# Patient Record
Sex: Female | Born: 1950 | Race: Black or African American | Hispanic: No | Smoking: Never smoker
Health system: Southern US, Community
[De-identification: ages and names within clinical notes are randomized; demographics above are authoritative.]

## PROBLEM LIST (undated history)

## (undated) DIAGNOSIS — I10 Essential (primary) hypertension: Secondary | ICD-10-CM

## (undated) DIAGNOSIS — E039 Hypothyroidism, unspecified: Secondary | ICD-10-CM

## (undated) DIAGNOSIS — Z6820 Body mass index (BMI) 20.0-20.9, adult: Secondary | ICD-10-CM

## (undated) DIAGNOSIS — M109 Gout, unspecified: Secondary | ICD-10-CM

## (undated) DIAGNOSIS — Z8639 Personal history of other endocrine, nutritional and metabolic disease: Secondary | ICD-10-CM

## (undated) DIAGNOSIS — N179 Acute kidney failure, unspecified: Secondary | ICD-10-CM

## (undated) DIAGNOSIS — E119 Type 2 diabetes mellitus without complications: Secondary | ICD-10-CM

## (undated) DIAGNOSIS — H409 Unspecified glaucoma: Secondary | ICD-10-CM

## (undated) DIAGNOSIS — I341 Nonrheumatic mitral (valve) prolapse: Secondary | ICD-10-CM

## (undated) DIAGNOSIS — N189 Chronic kidney disease, unspecified: Secondary | ICD-10-CM

## (undated) DIAGNOSIS — Z992 Dependence on renal dialysis: Secondary | ICD-10-CM

## (undated) DIAGNOSIS — E785 Hyperlipidemia, unspecified: Secondary | ICD-10-CM

## (undated) HISTORY — PX: TUBAL LIGATION: SHX77

## (undated) HISTORY — DX: Body mass index (BMI) 20.0-20.9, adult: Z68.20

## (undated) HISTORY — DX: Gout, unspecified: M10.9

## (undated) HISTORY — DX: Hyperlipidemia, unspecified: E78.5

## (undated) HISTORY — DX: Dependence on renal dialysis: N17.9

## (undated) HISTORY — DX: Nonrheumatic mitral (valve) prolapse: I34.1

## (undated) HISTORY — PX: TONSILLECTOMY: SUR1361

## (undated) HISTORY — DX: Unspecified glaucoma: H40.9

## (undated) HISTORY — DX: Dependence on renal dialysis: Z99.2

---

## 1983-04-02 HISTORY — PX: THYROIDECTOMY, PARTIAL: SHX18

## 1996-05-12 ENCOUNTER — Encounter: Payer: Self-pay | Admitting: Cardiovascular Disease

## 2000-04-18 ENCOUNTER — Other Ambulatory Visit: Admission: RE | Admit: 2000-04-18 | Discharge: 2000-04-18 | Payer: Self-pay | Admitting: Obstetrics and Gynecology

## 2002-02-05 ENCOUNTER — Other Ambulatory Visit: Admission: RE | Admit: 2002-02-05 | Discharge: 2002-02-05 | Payer: Self-pay | Admitting: Obstetrics and Gynecology

## 2004-10-24 ENCOUNTER — Ambulatory Visit (HOSPITAL_COMMUNITY): Admission: RE | Admit: 2004-10-24 | Discharge: 2004-10-24 | Payer: Self-pay | Admitting: Family Medicine

## 2004-11-22 ENCOUNTER — Ambulatory Visit: Payer: Self-pay | Admitting: Cardiology

## 2004-12-10 ENCOUNTER — Ambulatory Visit: Payer: Self-pay

## 2005-05-21 ENCOUNTER — Emergency Department (HOSPITAL_COMMUNITY): Admission: EM | Admit: 2005-05-21 | Discharge: 2005-05-21 | Payer: Self-pay | Admitting: Emergency Medicine

## 2005-07-29 ENCOUNTER — Emergency Department (HOSPITAL_COMMUNITY): Admission: EM | Admit: 2005-07-29 | Discharge: 2005-07-29 | Payer: Self-pay | Admitting: Emergency Medicine

## 2006-01-02 ENCOUNTER — Encounter: Admission: RE | Admit: 2006-01-02 | Discharge: 2006-01-02 | Payer: Self-pay | Admitting: Family Medicine

## 2006-03-07 ENCOUNTER — Ambulatory Visit (HOSPITAL_COMMUNITY): Admission: RE | Admit: 2006-03-07 | Discharge: 2006-03-07 | Payer: Self-pay | Admitting: Family Medicine

## 2009-08-12 ENCOUNTER — Emergency Department (HOSPITAL_COMMUNITY)
Admission: EM | Admit: 2009-08-12 | Discharge: 2009-08-12 | Payer: Self-pay | Source: Home / Self Care | Admitting: Emergency Medicine

## 2009-08-29 ENCOUNTER — Encounter (INDEPENDENT_AMBULATORY_CARE_PROVIDER_SITE_OTHER): Payer: Self-pay | Admitting: *Deleted

## 2009-09-21 ENCOUNTER — Encounter (INDEPENDENT_AMBULATORY_CARE_PROVIDER_SITE_OTHER): Payer: Self-pay | Admitting: *Deleted

## 2009-09-25 ENCOUNTER — Ambulatory Visit: Payer: Self-pay | Admitting: Gastroenterology

## 2009-10-09 ENCOUNTER — Ambulatory Visit: Payer: Self-pay | Admitting: Gastroenterology

## 2010-01-22 ENCOUNTER — Ambulatory Visit (HOSPITAL_COMMUNITY)
Admission: RE | Admit: 2010-01-22 | Discharge: 2010-01-22 | Payer: Self-pay | Source: Home / Self Care | Admitting: Nephrology

## 2010-04-01 HISTORY — PX: KIDNEY TRANSPLANT: SHX239

## 2010-04-12 ENCOUNTER — Ambulatory Visit (HOSPITAL_COMMUNITY)
Admission: RE | Admit: 2010-04-12 | Discharge: 2010-04-12 | Payer: Self-pay | Source: Home / Self Care | Attending: Nephrology | Admitting: Nephrology

## 2010-05-01 NOTE — Letter (Signed)
Summary: [facility] Instructions  [facility]  [place], [place] [address]   Phone: [phone]  Fax: [phone]       Michelle Rubio    Dec 17, 1950    MRN: [id number]        Procedure Day Sudie Grumbling:  Adelfa Koh  10/09/09     Arrival Time:  9:00AM     Procedure Time:  10:00AM     Location of Procedure:                    [facility] _  [place] (4th Floor)                        [place]   Starting 5 days prior to your procedure 10/04/09 do not eat nuts, seeds, popcorn, corn, beans, peas,  salads, or any raw vegetables.  Do not take any fiber supplements (e.g. Metamucil, Citrucel, and Benefiber).  THE DAY BEFORE YOUR PROCEDURE         DATE: 10/08/09  DAY: SUNDAY  1.  Drink clear liquids the entire day-NO SOLID FOOD  2.  Do not drink anything colored red or purple.  Avoid juices with pulp.  No orange juice.  3.  Drink at least 64 oz. (8 glasses) of fluid/clear liquids during the day to prevent dehydration and help the prep work efficiently.  CLEAR LIQUIDS INCLUDE: Water Jello Ice Popsicles Tea (sugar ok, no milk/cream) Powdered fruit flavored drinks Coffee (sugar ok, no milk/cream) Gatorade Juice: apple, white grape, white cranberry  Lemonade Clear bullion, consomm, broth Carbonated beverages (any kind) Strained chicken noodle soup Hard Candy                             4.  In the morning, mix first dose of MoviPrep solution:    Empty 1 Pouch A and 1 Pouch B into the disposable container    Add lukewarm drinking water to the top line of the container. Mix to dissolve    Refrigerate (mixed solution should be used within 24 hrs)  5.  Begin drinking the prep at 5:00 p.m. The MoviPrep container is divided by 4 marks.   Every 15 minutes drink the solution down to the next mark (approximately 8 oz) until the full liter is complete.   6.  Follow completed prep with 16 oz of clear liquid of your choice  (Nothing red or purple).  Continue to drink clear liquids until bedtime.  7.  Before going to bed, mix second dose of MoviPrep solution:    Empty 1 Pouch A and 1 Pouch B into the disposable container    Add lukewarm drinking water to the top line of the container. Mix to dissolve    Refrigerate  THE DAY OF YOUR PROCEDURE      DATE: 10/09/09  DAY: MONDAY  Beginning at 5:00AM (5 hours before procedure):         1. Every 15 minutes, drink the solution down to the next mark (approx 8 oz) until the full liter is complete.  2. Follow completed prep with 16 oz. of clear liquid of your choice.    3. You may drink clear liquids until 8:00AM (2 HOURS BEFORE PROCEDURE).   MEDICATION INSTRUCTIONS  Unless otherwise instructed, you should take regular prescription medications with a small sip of water   as early as possible the morning  of your procedure.          OTHER INSTRUCTIONS  You will need a responsible adult at least 58 years of age to accompany you and drive you home.   This person must remain in the waiting room during your procedure.  Wear loose fitting clothing that is easily removed.  Leave jewelry and other valuables at home.  However, you may wish to bring a book to read or  an iPod/MP3 player to listen to music as you wait for your procedure to start.  Remove all body piercing jewelry and leave at home.  Total time from sign-in until discharge is approximately 2-3 hours.  You should go home directly after your procedure and rest.  You can resume normal activities the  day after your procedure.  The day of your procedure you should not:   Drive   Make legal decisions   Operate machinery   Drink alcohol   Return to work  You will receive specific instructions about eating, activities and medications before you leave.    The above instructions have been reviewed and explained to me by   Emerson Monte RN  September 25, 2009 11:08 AM     I fully  understand and can verbalize these instructions _____________________________ Date _________

## 2010-05-01 NOTE — Letter (Signed)
Summary: Previsit letter  [facility]  [place], [place] [address]   Phone: [phone]  Fax: [phone]       08/29/2009 MRN: [id number]  [facility] [place], [place]  [address]  Dear Ms. Michelle Rubio,  Welcome to the Gastroenterology Division at [facility].    You are scheduled to see a nurse for your pre-procedure visit on 09-25-09 at 10:30am on the 3rd floor at [facility] [facility].  We ask that you try to arrive at our office 15 minutes prior to your appointment time to allow for check-in.  Your nurse visit will consist of discussing your medical and surgical history, your immediate family medical history, and your medications.    Please bring a complete list of all your medications or, if you prefer, bring the medication bottles and we will list them.  We will need to be aware of both prescribed and over the counter drugs.  We will need to know exact dosage information as well.  If you are on blood thinners (Coumadin, Plavix, Aggrenox, Ticlid, etc.) please call our office today/prior to your appointment, as we need to consult with your physician about holding your medication.   Please be prepared to read and sign documents such as consent forms, a financial agreement, and acknowledgement forms.  If necessary, and with your consent, a friend or relative is welcome to sit-in on the nurse visit with you.  Please bring your insurance card so that we may make a copy of it.  If your insurance requires a referral to see a specialist, please bring your referral form from your primary care physician.  No co-pay is required for this nurse visit.     If you cannot keep your appointment, please call [phone] to cancel or reschedule prior to your appointment date.  This allows [place] the opportunity to schedule an appointment for another patient in need of care.    Thank you for choosing [facility] for your medical  needs.  We appreciate the opportunity to care for you.  Please visit [place] at our website  to learn more about our practice.                     Sincerely.                                                                                                                   The Gastroenterology Division

## 2010-05-01 NOTE — Miscellaneous (Signed)
Summary: [facility] Previsit/prep  Clinical Lists Changes  Medications: Added new medication of MOVIPREP 100 GM  SOLR (PEG-KCL-NACL-NASULF-NA ASC-C) As per prep instructions. - Signed Rx of MOVIPREP 100 GM  SOLR (PEG-KCL-NACL-NASULF-NA ASC-C) As per prep instructions.;  #1 x 0;  Signed;  Entered by: Emerson Monte RN;  Authorized by: Milus Banister MD;  Method used: Electronically to [facility] [phone]*, [address], [place], [place]  [address], Ph: [phone], Fax: [phone] Observations: Added new observation of NKA: T (09/25/2009 10:26)    Prescriptions: MOVIPREP 100 GM  SOLR (PEG-KCL-NACL-NASULF-NA ASC-C) As per prep instructions.  #1 x 0   Entered by:   Emerson Monte RN   Authorized by:   Milus Banister MD   Signed by:   Emerson Monte RN on 09/25/2009   Method used:   Electronically to        [facility] [address] [phone]* (retail)       [address]       [address]       [place], [place]  [address]       Ph: [phone]       Fax: [phone]   RxID:   [id number]

## 2010-05-01 NOTE — Procedures (Signed)
Summary: Colonoscopy  Patient: Michelle Rubio Note: All result statuses are Final unless otherwise noted.  Tests: (1) Colonoscopy (COL)   COL Colonoscopy           [facility]     [place], [place]  [address]           COLONOSCOPY PROCEDURE REPORT           PATIENT:  Michelle Rubio, Michelle Rubio  MR#:  [id number]     BIRTHDATE:  1950/04/27, 59 yrs. old  GENDER:  female     ENDOSCOPIST:  Milus Banister, MD     REF. BY:  Gus Height, M.D.     PROCEDURE DATE:  10/09/2009     PROCEDURE:  Diagnostic Colonoscopy     ASA CLASS:  Class II     INDICATIONS:  Routine Risk Screening     MEDICATIONS:   Fentanyl 75 mcg IV, Versed 7 mg IV           DESCRIPTION OF PROCEDURE:   After the risks benefits and     alternatives of the procedure were thoroughly explained, informed     consent was obtained.  Digital rectal exam was performed and     revealed no rectal masses.   The LB [id number]-[id number] endoscope     was introduced through the anus and advanced to the cecum, which     was identified by both the appendix and ileocecal valve, without     limitations.  The quality of the prep was good, using MoviPrep.     The instrument was then slowly withdrawn as the colon was fully     examined.     <<PROCEDUREIMAGES>>           FINDINGS:  A normal appearing cecum, ileocecal valve, and     appendiceal orifice were identified. The ascending, hepatic     flexure, transverse, splenic flexure, descending, sigmoid colon,     and rectum appeared unremarkable (see image1, image2, and image3).     Retroflexed views in the rectum revealed no abnormalities.    The     scope was then withdrawn from the patient and the procedure     completed.           COMPLICATIONS:  None     ENDOSCOPIC IMPRESSION:     1) Normal colon     2) No polyps or cancers           RECOMMENDATIONS:     1) Continue current colorectal screening recommendations for     "routine risk" patients with a repeat  colonoscopy in 10 years.           REPEAT EXAM:  10 years           ______________________________     Milus Banister, MD           n.     eSIGNED:   Milus Banister at 10/09/2009 10:29 AM           Trudee Grip, [id number]  Note: An exclamation mark (!) indicates a result that was not dispersed into the flowsheet. Document Creation Date: 10/09/2009 10:31 AM _______________________________________________________________________  (1) Order result status: Final Collection or observation date-time: 10/09/2009 10:27 Requested date-time:  Receipt date-time:  Reported date-time:  Referring Physician:   Ordering Physician: Owens Loffler [phone]) Specimen Source:  Source: Tawanna Cooler Order Number: [id number] Lab site:  Appended Document: Colonoscopy     Colonoscopy  Procedure date:  10/09/2009  Findings:      Location:  [facility].    Procedures Next Due Date:    Colonoscopy: 09/2019

## 2010-06-19 LAB — POCT I-STAT, CHEM 8
BUN: 36 mg/dL — ABNORMAL HIGH (ref 6–23)
Calcium, Ion: 0.95 mmol/L — ABNORMAL LOW (ref 1.12–1.32)
Creatinine, Ser: 6 mg/dL — ABNORMAL HIGH (ref 0.4–1.2)
Glucose, Bld: 89 mg/dL (ref 70–99)
Hemoglobin: 12.9 g/dL (ref 12.0–15.0)
TCO2: 26 mmol/L (ref 0–100)

## 2011-11-07 ENCOUNTER — Other Ambulatory Visit: Payer: Self-pay | Admitting: Obstetrics and Gynecology

## 2011-11-26 ENCOUNTER — Encounter (HOSPITAL_COMMUNITY): Payer: Self-pay | Admitting: Pharmacist

## 2011-12-06 ENCOUNTER — Encounter (HOSPITAL_COMMUNITY)
Admission: RE | Admit: 2011-12-06 | Discharge: 2011-12-06 | Disposition: A | Discharge status: Home / Self Care | Payer: Medicare Other | Source: Ambulatory Visit | Attending: Obstetrics and Gynecology | Admitting: Obstetrics and Gynecology

## 2011-12-06 ENCOUNTER — Encounter (HOSPITAL_COMMUNITY): Payer: Self-pay

## 2011-12-06 ENCOUNTER — Other Ambulatory Visit: Payer: Self-pay

## 2011-12-06 DIAGNOSIS — Z01812 Encounter for preprocedural laboratory examination: Secondary | ICD-10-CM | POA: Insufficient documentation

## 2011-12-06 DIAGNOSIS — Z01818 Encounter for other preprocedural examination: Secondary | ICD-10-CM | POA: Insufficient documentation

## 2011-12-06 HISTORY — DX: Chronic kidney disease, unspecified: N18.9

## 2011-12-06 HISTORY — DX: Hypothyroidism, unspecified: E03.9

## 2011-12-06 HISTORY — DX: Personal history of other endocrine, nutritional and metabolic disease: Z86.39

## 2011-12-06 HISTORY — DX: Essential (primary) hypertension: I10

## 2011-12-06 LAB — CBC
HCT: 37.8 % (ref 36.0–46.0)
Hemoglobin: 12.4 g/dL (ref 12.0–15.0)
MCHC: 32.8 g/dL (ref 30.0–36.0)
MCV: 88.5 fL (ref 78.0–100.0)
RDW: 13.6 % (ref 11.5–15.5)
WBC: 4.5 10*3/uL (ref 4.0–10.5)

## 2011-12-06 LAB — BASIC METABOLIC PANEL
BUN: 24 mg/dL — ABNORMAL HIGH (ref 6–23)
CO2: 27 mEq/L (ref 19–32)
Chloride: 103 mEq/L (ref 96–112)
Creatinine, Ser: 1.15 mg/dL — ABNORMAL HIGH (ref 0.50–1.10)
Potassium: 4.9 mEq/L (ref 3.5–5.1)

## 2011-12-06 LAB — PROTIME-INR: INR: 1.05 (ref 0.00–1.49)

## 2011-12-06 NOTE — Patient Instructions (Signed)
Your procedure is scheduled on:12/10/11  Enter through the Main Entrance at :[place] up desk phone and dial [phone] and inform us of your arrival.  Please call [phone] if you have any problems the morning of surgery.  Remember: Do not eat after midnight :  MONDAY Do not drink after:6am Tuesday- water only  Take these meds the morning of surgery with a sip of water: thyroid pill, metoprolol, Prograf, Myfortic  DO NOT wear jewelry, eye make-up, lipstick,body lotion, or dark fingernail polish. Do not shave for 48 hours prior to surgery.  If you are to be admitted after surgery, leave suitcase in car until your room has been assigned. Patients discharged on the day of surgery will not be allowed to drive home.   Remember to use your Hibiclens as instructed.

## 2011-12-06 NOTE — Pre-Procedure Instructions (Signed)
Abnormal EKG done today was shared with Dr. Dawayne Cirri, who requested old EKG for comparison, which I got from [place] shows changes per Dr. Glennon Mac and he wants pt to have cardiac clearance prior to surgery. I have notified Meghan in Dr. Loni Muse. Ross's office who will contact Dr. Harrington Challenger who is out of the office, and she will also contact patient to set up cardiac clearance.

## 2011-12-09 ENCOUNTER — Encounter: Payer: Self-pay | Admitting: *Deleted

## 2011-12-09 ENCOUNTER — Encounter: Payer: Self-pay | Admitting: Cardiology

## 2011-12-09 DIAGNOSIS — I10 Essential (primary) hypertension: Secondary | ICD-10-CM | POA: Insufficient documentation

## 2011-12-09 DIAGNOSIS — N189 Chronic kidney disease, unspecified: Secondary | ICD-10-CM | POA: Insufficient documentation

## 2011-12-09 DIAGNOSIS — Z8639 Personal history of other endocrine, nutritional and metabolic disease: Secondary | ICD-10-CM | POA: Insufficient documentation

## 2011-12-09 DIAGNOSIS — E039 Hypothyroidism, unspecified: Secondary | ICD-10-CM | POA: Insufficient documentation

## 2011-12-10 ENCOUNTER — Ambulatory Visit (INDEPENDENT_AMBULATORY_CARE_PROVIDER_SITE_OTHER): Payer: Medicare Other | Admitting: Cardiology

## 2011-12-10 ENCOUNTER — Encounter (HOSPITAL_COMMUNITY): Admission: RE | Payer: Self-pay | Source: Ambulatory Visit

## 2011-12-10 ENCOUNTER — Encounter: Payer: Self-pay | Admitting: Cardiology

## 2011-12-10 ENCOUNTER — Inpatient Hospital Stay (HOSPITAL_COMMUNITY)
Admission: RE | Admit: 2011-12-10 | Payer: Medicare Other | Source: Ambulatory Visit | Admitting: Obstetrics and Gynecology

## 2011-12-10 VITALS — BP 129/74 | HR 51 | Ht 70.0 in | Wt 148.0 lb

## 2011-12-10 DIAGNOSIS — R9431 Abnormal electrocardiogram [ECG] [EKG]: Secondary | ICD-10-CM

## 2011-12-10 DIAGNOSIS — I10 Essential (primary) hypertension: Secondary | ICD-10-CM

## 2011-12-10 DIAGNOSIS — Z01810 Encounter for preprocedural cardiovascular examination: Secondary | ICD-10-CM | POA: Insufficient documentation

## 2011-12-10 SURGERY — HYSTERECTOMY, VAGINAL
Anesthesia: Choice

## 2011-12-10 NOTE — Assessment & Plan Note (Addendum)
Plan stress echocardiogram preoperatively. If normal she may proceed with her surgery without further cardiac evaluation.

## 2011-12-10 NOTE — Progress Notes (Signed)
HPI: 61 year old female for preoperative evaluation prior to hysterectomy. Patient apparently was diagnosed with mitral valve prolapse in the past. She was scheduled for surgery and her electrocardiogram was noted to be abnormal. Cardiology is asked to evaluate. She denies dyspnea, chest pain, palpitations or syncope. No claudication.  Current Outpatient Prescriptions  Medication Sig Dispense Refill  . aspirin 81 MG tablet Take 81 mg by mouth daily.      . brimonidine (ALPHAGAN P) 0.1 % SOLN Place 1 drop into both eyes 2 (two) times daily.      Marland Kitchen levofloxacin (LEVAQUIN) 250 MG tablet Take 250 mg by mouth daily.      Marland Kitchen levothyroxine (SYNTHROID, LEVOTHROID) 112 MCG tablet Take 112 mcg by mouth daily.      . metoprolol tartrate (LOPRESSOR) 25 MG tablet Take 25 mg by mouth 2 (two) times daily.      . mycophenolate (MYFORTIC) 180 MG EC tablet Take 180 mg by mouth 2 (two) times daily.      . Omega-3 Fatty Acids (FISH OIL) 1200 MG CAPS Take 2 capsules by mouth daily.      Marland Kitchen sulfamethoxazole-trimethoprim (BACTRIM,SEPTRA) 400-80 MG per tablet Take 1 tablet by mouth every Monday, Wednesday, and Friday.      . tacrolimus (PROGRAF) 0.5 MG capsule Take 0.5 mg by mouth every morning. Takes 0.5mg  in addition to the 1mg  tablet in the morning      . tacrolimus (PROGRAF) 1 MG capsule Take 1-2 mg by mouth 2 (two) times daily. Pt takes 1 tablet (1mg ) in morning and 2 tablets (2mg ) in evening      . Travoprost, BAK Free, (TRAVATAN) 0.004 % SOLN ophthalmic solution Place 1 drop into both eyes at bedtime.        No Known Allergies  Past Medical History  Diagnosis Date  . Hypertension   . Hypothyroidism   . Chronic kidney disease     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  . H/O Graves' disease 1980s  . Mitral valve prolapse     Past Surgical History  Procedure Date  . Kidney transplant 2012  . Thyroidectomy, partial 1985  . Tubal ligation   . Tonsillectomy     History   Social History    . Marital Status: Widowed    Spouse Name: N/A    Number of Children: 2  . Years of Education: N/A   Occupational History  . Not on file.   Social History Main Topics  . Smoking status: Never Smoker   . Smokeless tobacco: Not on file  . Alcohol Use: No  . Drug Use: No  . Sexually Active:    Other Topics Concern  . Not on file   Social History Narrative  . No narrative on file    Family History  Problem Relation Age of Onset  . Coronary artery disease Mother   . Heart disease Father     CHF    ROS: fatigue but no fevers or chills, productive cough, hemoptysis, dysphasia, odynophagia, melena, hematochezia, dysuria, hematuria, rash, seizure activity, orthopnea, PND, pedal edema, claudication. Remaining systems are negative.  Physical Exam:   Blood pressure 129/74, pulse 51, height 5\' 10"  (1.778 m), weight 148 lb (67.132 kg).  General:  Well developed/well nourished in NAD Skin warm/dry Patient not depressed No peripheral clubbing Back-normal HEENT-normal/normal eyelids Neck supple/normal carotid upstroke bilaterally; no bruits; no JVD; previous thyroidectomy chest - CTA/ normal expansion CV - RRR/normal S1 and S2; no murmurs, rubs  or gallops;  PMI nondisplaced Abdomen -NT/ND, no HSM, no mass, + bowel sounds, no bruit 2+ femoral pulses, no bruits Ext-no edema, chords, 2+ DP Neuro-grossly nonfocal  ECG 12/06/2011-sinus bradycardia at a rate of 50. Nonspecific T-wave changes.

## 2011-12-10 NOTE — Assessment & Plan Note (Signed)
Blood pressure controlled. Continue present medications. 

## 2011-12-10 NOTE — Assessment & Plan Note (Signed)
Stress echocardiogram

## 2011-12-10 NOTE — Patient Instructions (Addendum)
Your physician recommends that you schedule a follow-up appointment in: AS NEEDED PENDING TEST RESULTS  Your physician has requested that you have a stress echocardiogram. For further information please visit [email or web address]. Please follow instruction sheet as given.

## 2011-12-19 ENCOUNTER — Encounter: Payer: Self-pay | Admitting: Cardiology

## 2011-12-19 ENCOUNTER — Ambulatory Visit (HOSPITAL_COMMUNITY): Payer: Medicare Other | Attending: Cardiology

## 2011-12-19 DIAGNOSIS — R9431 Abnormal electrocardiogram [ECG] [EKG]: Secondary | ICD-10-CM | POA: Insufficient documentation

## 2011-12-19 DIAGNOSIS — I10 Essential (primary) hypertension: Secondary | ICD-10-CM | POA: Insufficient documentation

## 2011-12-19 NOTE — Progress Notes (Signed)
Echocardiogram performed.  

## 2012-11-26 ENCOUNTER — Encounter (HOSPITAL_COMMUNITY): Payer: Self-pay | Admitting: Dietician

## 2012-11-26 NOTE — Progress Notes (Signed)
[facility] Diabetes Class Completion  Date:November 26, 2012  Time: [id number]  Pt attended [name]'s Diabetes Group Education Class on November 26, 2012.   Patient was educated on the following topics:   -Survival skills (signs and symptoms of hyperglycemia and hypoglycemia, treatment for hypoglycemia, ideal levels for fasting and postprandial blood sugars, goal Hgb A1c level, foot care basics)  -Recommendations for physical activity   -Carbohydrate metabolism in relation to diabetes   -Meal planning (sources of carbohydrate, carbohydrate counting, meal planning strategies, food label reading, and portion control).  Handouts provided:  -"Diabetes and You: Taking Charge of Your Health"  -"Carbohydrate Counting and Meal Planning"  -"Blood Sugar Diary"  -"Your Guide to Better Office Visits"   [name] A. Jimmye Norman, RD, LDN

## 2013-09-01 ENCOUNTER — Other Ambulatory Visit: Payer: Self-pay | Admitting: Obstetrics and Gynecology

## 2013-09-01 DIAGNOSIS — R928 Other abnormal and inconclusive findings on diagnostic imaging of breast: Secondary | ICD-10-CM

## 2013-09-13 ENCOUNTER — Ambulatory Visit
Admission: RE | Admit: 2013-09-13 | Discharge: 2013-09-13 | Disposition: A | Discharge status: Home / Self Care | Payer: Medicare Other | Source: Ambulatory Visit | Attending: Obstetrics and Gynecology | Admitting: Obstetrics and Gynecology

## 2013-09-13 DIAGNOSIS — R928 Other abnormal and inconclusive findings on diagnostic imaging of breast: Secondary | ICD-10-CM

## 2013-10-07 ENCOUNTER — Other Ambulatory Visit (INDEPENDENT_AMBULATORY_CARE_PROVIDER_SITE_OTHER): Payer: Self-pay | Admitting: Otolaryngology

## 2013-10-07 DIAGNOSIS — H918X9 Other specified hearing loss, unspecified ear: Secondary | ICD-10-CM

## 2013-10-20 ENCOUNTER — Other Ambulatory Visit: Payer: Medicare Other

## 2015-01-11 ENCOUNTER — Encounter: Payer: Self-pay | Admitting: Gastroenterology

## 2015-05-15 DIAGNOSIS — Z79899 Other long term (current) drug therapy: Secondary | ICD-10-CM | POA: Diagnosis not present

## 2015-05-15 DIAGNOSIS — Z940 Kidney transplant status: Secondary | ICD-10-CM | POA: Diagnosis not present

## 2015-05-25 DIAGNOSIS — Z6820 Body mass index (BMI) 20.0-20.9, adult: Secondary | ICD-10-CM | POA: Diagnosis not present

## 2015-05-25 DIAGNOSIS — E782 Mixed hyperlipidemia: Secondary | ICD-10-CM | POA: Diagnosis not present

## 2015-05-25 DIAGNOSIS — E039 Hypothyroidism, unspecified: Secondary | ICD-10-CM | POA: Diagnosis not present

## 2015-05-25 DIAGNOSIS — R7301 Impaired fasting glucose: Secondary | ICD-10-CM | POA: Diagnosis not present

## 2015-05-25 DIAGNOSIS — Z0000 Encounter for general adult medical examination without abnormal findings: Secondary | ICD-10-CM | POA: Diagnosis not present

## 2015-05-26 DIAGNOSIS — H401113 Primary open-angle glaucoma, right eye, severe stage: Secondary | ICD-10-CM | POA: Diagnosis not present

## 2015-05-26 DIAGNOSIS — H401122 Primary open-angle glaucoma, left eye, moderate stage: Secondary | ICD-10-CM | POA: Diagnosis not present

## 2015-06-16 DIAGNOSIS — H401113 Primary open-angle glaucoma, right eye, severe stage: Secondary | ICD-10-CM | POA: Diagnosis not present

## 2015-06-16 DIAGNOSIS — H401122 Primary open-angle glaucoma, left eye, moderate stage: Secondary | ICD-10-CM | POA: Diagnosis not present

## 2015-07-21 DIAGNOSIS — Z1389 Encounter for screening for other disorder: Secondary | ICD-10-CM | POA: Diagnosis not present

## 2015-07-21 DIAGNOSIS — E063 Autoimmune thyroiditis: Secondary | ICD-10-CM | POA: Diagnosis not present

## 2015-07-21 DIAGNOSIS — Z681 Body mass index (BMI) 19 or less, adult: Secondary | ICD-10-CM | POA: Diagnosis not present

## 2015-08-10 DIAGNOSIS — Z7982 Long term (current) use of aspirin: Secondary | ICD-10-CM | POA: Diagnosis not present

## 2015-08-10 DIAGNOSIS — Z792 Long term (current) use of antibiotics: Secondary | ICD-10-CM | POA: Diagnosis not present

## 2015-08-10 DIAGNOSIS — I10 Essential (primary) hypertension: Secondary | ICD-10-CM | POA: Diagnosis not present

## 2015-08-10 DIAGNOSIS — Z4822 Encounter for aftercare following kidney transplant: Secondary | ICD-10-CM | POA: Diagnosis not present

## 2015-08-10 DIAGNOSIS — Z940 Kidney transplant status: Secondary | ICD-10-CM | POA: Diagnosis not present

## 2015-08-10 DIAGNOSIS — D8989 Other specified disorders involving the immune mechanism, not elsewhere classified: Secondary | ICD-10-CM | POA: Diagnosis not present

## 2015-08-10 DIAGNOSIS — Z79899 Other long term (current) drug therapy: Secondary | ICD-10-CM | POA: Diagnosis not present

## 2015-08-10 DIAGNOSIS — N814 Uterovaginal prolapse, unspecified: Secondary | ICD-10-CM | POA: Diagnosis not present

## 2015-08-24 DIAGNOSIS — Z940 Kidney transplant status: Secondary | ICD-10-CM | POA: Diagnosis not present

## 2015-09-07 DIAGNOSIS — L853 Xerosis cutis: Secondary | ICD-10-CM | POA: Diagnosis not present

## 2015-09-07 DIAGNOSIS — L700 Acne vulgaris: Secondary | ICD-10-CM | POA: Diagnosis not present

## 2015-09-07 DIAGNOSIS — L814 Other melanin hyperpigmentation: Secondary | ICD-10-CM | POA: Diagnosis not present

## 2015-09-07 DIAGNOSIS — D225 Melanocytic nevi of trunk: Secondary | ICD-10-CM | POA: Diagnosis not present

## 2015-11-01 DIAGNOSIS — D899 Disorder involving the immune mechanism, unspecified: Secondary | ICD-10-CM | POA: Diagnosis not present

## 2015-11-01 DIAGNOSIS — N186 End stage renal disease: Secondary | ICD-10-CM | POA: Diagnosis not present

## 2015-11-01 DIAGNOSIS — Z7982 Long term (current) use of aspirin: Secondary | ICD-10-CM | POA: Diagnosis not present

## 2015-11-01 DIAGNOSIS — T8611 Kidney transplant rejection: Secondary | ICD-10-CM | POA: Diagnosis not present

## 2015-11-01 DIAGNOSIS — D72819 Decreased white blood cell count, unspecified: Secondary | ICD-10-CM | POA: Diagnosis not present

## 2015-11-01 DIAGNOSIS — Z4822 Encounter for aftercare following kidney transplant: Secondary | ICD-10-CM | POA: Diagnosis not present

## 2015-11-01 DIAGNOSIS — Z79899 Other long term (current) drug therapy: Secondary | ICD-10-CM | POA: Diagnosis not present

## 2015-11-01 DIAGNOSIS — I120 Hypertensive chronic kidney disease with stage 5 chronic kidney disease or end stage renal disease: Secondary | ICD-10-CM | POA: Diagnosis not present

## 2015-11-01 DIAGNOSIS — B9789 Other viral agents as the cause of diseases classified elsewhere: Secondary | ICD-10-CM | POA: Diagnosis not present

## 2015-11-01 DIAGNOSIS — X58XXXD Exposure to other specified factors, subsequent encounter: Secondary | ICD-10-CM | POA: Diagnosis not present

## 2015-11-03 DIAGNOSIS — Z1389 Encounter for screening for other disorder: Secondary | ICD-10-CM | POA: Diagnosis not present

## 2015-11-03 DIAGNOSIS — Z940 Kidney transplant status: Secondary | ICD-10-CM | POA: Diagnosis not present

## 2015-11-03 DIAGNOSIS — E063 Autoimmune thyroiditis: Secondary | ICD-10-CM | POA: Diagnosis not present

## 2015-11-03 DIAGNOSIS — D631 Anemia in chronic kidney disease: Secondary | ICD-10-CM | POA: Diagnosis not present

## 2015-11-03 DIAGNOSIS — R7309 Other abnormal glucose: Secondary | ICD-10-CM | POA: Diagnosis not present

## 2015-11-03 DIAGNOSIS — Z681 Body mass index (BMI) 19 or less, adult: Secondary | ICD-10-CM | POA: Diagnosis not present

## 2015-11-16 DIAGNOSIS — H401122 Primary open-angle glaucoma, left eye, moderate stage: Secondary | ICD-10-CM | POA: Diagnosis not present

## 2015-11-16 DIAGNOSIS — H401113 Primary open-angle glaucoma, right eye, severe stage: Secondary | ICD-10-CM | POA: Diagnosis not present

## 2016-01-25 DIAGNOSIS — E063 Autoimmune thyroiditis: Secondary | ICD-10-CM | POA: Diagnosis not present

## 2016-01-25 DIAGNOSIS — Z1389 Encounter for screening for other disorder: Secondary | ICD-10-CM | POA: Diagnosis not present

## 2016-01-25 DIAGNOSIS — Z6820 Body mass index (BMI) 20.0-20.9, adult: Secondary | ICD-10-CM | POA: Diagnosis not present

## 2016-01-25 DIAGNOSIS — M810 Age-related osteoporosis without current pathological fracture: Secondary | ICD-10-CM | POA: Diagnosis not present

## 2016-01-25 DIAGNOSIS — R5383 Other fatigue: Secondary | ICD-10-CM | POA: Diagnosis not present

## 2016-01-25 DIAGNOSIS — Z79891 Long term (current) use of opiate analgesic: Secondary | ICD-10-CM | POA: Diagnosis not present

## 2016-02-07 DIAGNOSIS — Z1231 Encounter for screening mammogram for malignant neoplasm of breast: Secondary | ICD-10-CM | POA: Diagnosis not present

## 2016-02-07 DIAGNOSIS — Z01419 Encounter for gynecological examination (general) (routine) without abnormal findings: Secondary | ICD-10-CM | POA: Diagnosis not present

## 2016-02-09 ENCOUNTER — Other Ambulatory Visit: Payer: Self-pay | Admitting: Obstetrics & Gynecology

## 2016-02-09 DIAGNOSIS — R928 Other abnormal and inconclusive findings on diagnostic imaging of breast: Secondary | ICD-10-CM

## 2016-02-16 ENCOUNTER — Other Ambulatory Visit: Payer: Self-pay

## 2016-02-16 ENCOUNTER — Ambulatory Visit
Admission: RE | Admit: 2016-02-16 | Discharge: 2016-02-16 | Disposition: A | Discharge status: Home / Self Care | Payer: PPO | Source: Ambulatory Visit | Attending: Obstetrics & Gynecology | Admitting: Obstetrics & Gynecology

## 2016-02-16 DIAGNOSIS — R928 Other abnormal and inconclusive findings on diagnostic imaging of breast: Secondary | ICD-10-CM

## 2016-03-08 DIAGNOSIS — N183 Chronic kidney disease, stage 3 (moderate): Secondary | ICD-10-CM | POA: Diagnosis not present

## 2016-03-08 DIAGNOSIS — N186 End stage renal disease: Secondary | ICD-10-CM | POA: Diagnosis not present

## 2016-03-08 DIAGNOSIS — B349 Viral infection, unspecified: Secondary | ICD-10-CM | POA: Diagnosis not present

## 2016-03-08 DIAGNOSIS — E0500 Thyrotoxicosis with diffuse goiter without thyrotoxic crisis or storm: Secondary | ICD-10-CM | POA: Diagnosis not present

## 2016-03-08 DIAGNOSIS — Z4822 Encounter for aftercare following kidney transplant: Secondary | ICD-10-CM | POA: Diagnosis not present

## 2016-03-08 DIAGNOSIS — Z7982 Long term (current) use of aspirin: Secondary | ICD-10-CM | POA: Diagnosis not present

## 2016-03-08 DIAGNOSIS — I120 Hypertensive chronic kidney disease with stage 5 chronic kidney disease or end stage renal disease: Secondary | ICD-10-CM | POA: Diagnosis not present

## 2016-03-08 DIAGNOSIS — Z79899 Other long term (current) drug therapy: Secondary | ICD-10-CM | POA: Diagnosis not present

## 2016-03-08 DIAGNOSIS — D899 Disorder involving the immune mechanism, unspecified: Secondary | ICD-10-CM | POA: Diagnosis not present

## 2016-03-15 DIAGNOSIS — N390 Urinary tract infection, site not specified: Secondary | ICD-10-CM | POA: Diagnosis not present

## 2016-04-02 DIAGNOSIS — H401113 Primary open-angle glaucoma, right eye, severe stage: Secondary | ICD-10-CM | POA: Diagnosis not present

## 2016-04-02 DIAGNOSIS — H401122 Primary open-angle glaucoma, left eye, moderate stage: Secondary | ICD-10-CM | POA: Diagnosis not present

## 2016-05-27 DIAGNOSIS — E063 Autoimmune thyroiditis: Secondary | ICD-10-CM | POA: Diagnosis not present

## 2016-05-27 DIAGNOSIS — Z1389 Encounter for screening for other disorder: Secondary | ICD-10-CM | POA: Diagnosis not present

## 2016-05-27 DIAGNOSIS — Z0000 Encounter for general adult medical examination without abnormal findings: Secondary | ICD-10-CM | POA: Diagnosis not present

## 2016-05-27 DIAGNOSIS — R7309 Other abnormal glucose: Secondary | ICD-10-CM | POA: Diagnosis not present

## 2016-05-27 DIAGNOSIS — Z6821 Body mass index (BMI) 21.0-21.9, adult: Secondary | ICD-10-CM | POA: Diagnosis not present

## 2016-06-04 DIAGNOSIS — N183 Chronic kidney disease, stage 3 (moderate): Secondary | ICD-10-CM | POA: Diagnosis not present

## 2016-07-08 DIAGNOSIS — E039 Hypothyroidism, unspecified: Secondary | ICD-10-CM | POA: Diagnosis not present

## 2016-07-19 DIAGNOSIS — N183 Chronic kidney disease, stage 3 (moderate): Secondary | ICD-10-CM | POA: Diagnosis not present

## 2016-07-19 DIAGNOSIS — I120 Hypertensive chronic kidney disease with stage 5 chronic kidney disease or end stage renal disease: Secondary | ICD-10-CM | POA: Diagnosis not present

## 2016-07-19 DIAGNOSIS — Z4822 Encounter for aftercare following kidney transplant: Secondary | ICD-10-CM | POA: Diagnosis not present

## 2016-07-19 DIAGNOSIS — Z79899 Other long term (current) drug therapy: Secondary | ICD-10-CM | POA: Diagnosis not present

## 2016-07-19 DIAGNOSIS — N186 End stage renal disease: Secondary | ICD-10-CM | POA: Diagnosis not present

## 2016-07-19 DIAGNOSIS — D899 Disorder involving the immune mechanism, unspecified: Secondary | ICD-10-CM | POA: Diagnosis not present

## 2016-08-09 DIAGNOSIS — Z7982 Long term (current) use of aspirin: Secondary | ICD-10-CM | POA: Diagnosis not present

## 2016-08-09 DIAGNOSIS — N814 Uterovaginal prolapse, unspecified: Secondary | ICD-10-CM | POA: Diagnosis not present

## 2016-08-09 DIAGNOSIS — T8611 Kidney transplant rejection: Secondary | ICD-10-CM | POA: Diagnosis not present

## 2016-08-09 DIAGNOSIS — Z4822 Encounter for aftercare following kidney transplant: Secondary | ICD-10-CM | POA: Diagnosis not present

## 2016-08-09 DIAGNOSIS — E782 Mixed hyperlipidemia: Secondary | ICD-10-CM | POA: Diagnosis not present

## 2016-08-09 DIAGNOSIS — R7989 Other specified abnormal findings of blood chemistry: Secondary | ICD-10-CM | POA: Diagnosis not present

## 2016-08-09 DIAGNOSIS — D899 Disorder involving the immune mechanism, unspecified: Secondary | ICD-10-CM | POA: Diagnosis not present

## 2016-08-09 DIAGNOSIS — R7309 Other abnormal glucose: Secondary | ICD-10-CM | POA: Diagnosis not present

## 2016-08-09 DIAGNOSIS — I10 Essential (primary) hypertension: Secondary | ICD-10-CM | POA: Diagnosis not present

## 2016-08-09 DIAGNOSIS — Z940 Kidney transplant status: Secondary | ICD-10-CM | POA: Diagnosis not present

## 2016-08-09 DIAGNOSIS — I120 Hypertensive chronic kidney disease with stage 5 chronic kidney disease or end stage renal disease: Secondary | ICD-10-CM | POA: Diagnosis not present

## 2016-08-09 DIAGNOSIS — Z79899 Other long term (current) drug therapy: Secondary | ICD-10-CM | POA: Diagnosis not present

## 2016-08-09 DIAGNOSIS — N186 End stage renal disease: Secondary | ICD-10-CM | POA: Diagnosis not present

## 2016-08-23 DIAGNOSIS — Z1389 Encounter for screening for other disorder: Secondary | ICD-10-CM | POA: Diagnosis not present

## 2016-08-23 DIAGNOSIS — E782 Mixed hyperlipidemia: Secondary | ICD-10-CM | POA: Diagnosis not present

## 2016-08-23 DIAGNOSIS — Z6820 Body mass index (BMI) 20.0-20.9, adult: Secondary | ICD-10-CM | POA: Diagnosis not present

## 2016-08-23 DIAGNOSIS — R7309 Other abnormal glucose: Secondary | ICD-10-CM | POA: Diagnosis not present

## 2016-10-10 DIAGNOSIS — J209 Acute bronchitis, unspecified: Secondary | ICD-10-CM | POA: Diagnosis not present

## 2016-10-10 DIAGNOSIS — Z6820 Body mass index (BMI) 20.0-20.9, adult: Secondary | ICD-10-CM | POA: Diagnosis not present

## 2016-10-10 DIAGNOSIS — J069 Acute upper respiratory infection, unspecified: Secondary | ICD-10-CM | POA: Diagnosis not present

## 2016-10-31 DIAGNOSIS — H401122 Primary open-angle glaucoma, left eye, moderate stage: Secondary | ICD-10-CM | POA: Diagnosis not present

## 2016-10-31 DIAGNOSIS — H401113 Primary open-angle glaucoma, right eye, severe stage: Secondary | ICD-10-CM | POA: Diagnosis not present

## 2016-11-21 DIAGNOSIS — B349 Viral infection, unspecified: Secondary | ICD-10-CM | POA: Diagnosis not present

## 2016-11-21 DIAGNOSIS — D899 Disorder involving the immune mechanism, unspecified: Secondary | ICD-10-CM | POA: Diagnosis not present

## 2016-11-21 DIAGNOSIS — I120 Hypertensive chronic kidney disease with stage 5 chronic kidney disease or end stage renal disease: Secondary | ICD-10-CM | POA: Diagnosis not present

## 2016-11-21 DIAGNOSIS — Z79899 Other long term (current) drug therapy: Secondary | ICD-10-CM | POA: Diagnosis not present

## 2016-11-21 DIAGNOSIS — T8611 Kidney transplant rejection: Secondary | ICD-10-CM | POA: Diagnosis not present

## 2016-11-21 DIAGNOSIS — Z7982 Long term (current) use of aspirin: Secondary | ICD-10-CM | POA: Diagnosis not present

## 2016-11-21 DIAGNOSIS — N183 Chronic kidney disease, stage 3 (moderate): Secondary | ICD-10-CM | POA: Diagnosis not present

## 2016-11-21 DIAGNOSIS — Z4822 Encounter for aftercare following kidney transplant: Secondary | ICD-10-CM | POA: Diagnosis not present

## 2016-11-21 DIAGNOSIS — N186 End stage renal disease: Secondary | ICD-10-CM | POA: Diagnosis not present

## 2016-12-12 DIAGNOSIS — Z940 Kidney transplant status: Secondary | ICD-10-CM | POA: Diagnosis not present

## 2016-12-12 DIAGNOSIS — Z79899 Other long term (current) drug therapy: Secondary | ICD-10-CM | POA: Diagnosis not present

## 2017-02-26 DIAGNOSIS — Z1389 Encounter for screening for other disorder: Secondary | ICD-10-CM | POA: Diagnosis not present

## 2017-02-26 DIAGNOSIS — Z6820 Body mass index (BMI) 20.0-20.9, adult: Secondary | ICD-10-CM | POA: Diagnosis not present

## 2017-02-26 DIAGNOSIS — L989 Disorder of the skin and subcutaneous tissue, unspecified: Secondary | ICD-10-CM | POA: Diagnosis not present

## 2017-04-11 DIAGNOSIS — Z792 Long term (current) use of antibiotics: Secondary | ICD-10-CM | POA: Diagnosis not present

## 2017-04-11 DIAGNOSIS — Z79899 Other long term (current) drug therapy: Secondary | ICD-10-CM | POA: Diagnosis not present

## 2017-04-11 DIAGNOSIS — Z4822 Encounter for aftercare following kidney transplant: Secondary | ICD-10-CM | POA: Diagnosis not present

## 2017-04-11 DIAGNOSIS — I10 Essential (primary) hypertension: Secondary | ICD-10-CM | POA: Diagnosis not present

## 2017-04-11 DIAGNOSIS — Z940 Kidney transplant status: Secondary | ICD-10-CM | POA: Diagnosis not present

## 2017-05-05 DIAGNOSIS — H401122 Primary open-angle glaucoma, left eye, moderate stage: Secondary | ICD-10-CM | POA: Diagnosis not present

## 2017-05-05 DIAGNOSIS — H401113 Primary open-angle glaucoma, right eye, severe stage: Secondary | ICD-10-CM | POA: Diagnosis not present

## 2017-05-28 DIAGNOSIS — N183 Chronic kidney disease, stage 3 (moderate): Secondary | ICD-10-CM | POA: Diagnosis not present

## 2017-05-28 DIAGNOSIS — E782 Mixed hyperlipidemia: Secondary | ICD-10-CM | POA: Diagnosis not present

## 2017-05-28 DIAGNOSIS — Z940 Kidney transplant status: Secondary | ICD-10-CM | POA: Diagnosis not present

## 2017-05-28 DIAGNOSIS — Z79891 Long term (current) use of opiate analgesic: Secondary | ICD-10-CM | POA: Diagnosis not present

## 2017-05-28 DIAGNOSIS — R7309 Other abnormal glucose: Secondary | ICD-10-CM | POA: Diagnosis not present

## 2017-05-28 DIAGNOSIS — M1991 Primary osteoarthritis, unspecified site: Secondary | ICD-10-CM | POA: Diagnosis not present

## 2017-05-28 DIAGNOSIS — Z6820 Body mass index (BMI) 20.0-20.9, adult: Secondary | ICD-10-CM | POA: Diagnosis not present

## 2017-05-28 DIAGNOSIS — Z0000 Encounter for general adult medical examination without abnormal findings: Secondary | ICD-10-CM | POA: Diagnosis not present

## 2017-05-28 DIAGNOSIS — Z1389 Encounter for screening for other disorder: Secondary | ICD-10-CM | POA: Diagnosis not present

## 2017-05-28 DIAGNOSIS — Z0001 Encounter for general adult medical examination with abnormal findings: Secondary | ICD-10-CM | POA: Diagnosis not present

## 2017-05-28 DIAGNOSIS — E063 Autoimmune thyroiditis: Secondary | ICD-10-CM | POA: Diagnosis not present

## 2017-06-03 ENCOUNTER — Other Ambulatory Visit: Payer: Self-pay | Admitting: Family Medicine

## 2017-06-03 DIAGNOSIS — E2839 Other primary ovarian failure: Secondary | ICD-10-CM

## 2017-06-04 DIAGNOSIS — N950 Postmenopausal bleeding: Secondary | ICD-10-CM | POA: Diagnosis not present

## 2017-06-04 DIAGNOSIS — N840 Polyp of corpus uteri: Secondary | ICD-10-CM | POA: Diagnosis not present

## 2017-06-06 ENCOUNTER — Encounter: Payer: Self-pay | Admitting: Radiology

## 2017-06-06 ENCOUNTER — Ambulatory Visit
Admission: RE | Admit: 2017-06-06 | Discharge: 2017-06-06 | Disposition: A | Discharge status: Home / Self Care | Payer: PPO | Source: Ambulatory Visit | Attending: Family Medicine | Admitting: Family Medicine

## 2017-06-06 DIAGNOSIS — E2839 Other primary ovarian failure: Secondary | ICD-10-CM

## 2017-06-06 DIAGNOSIS — Z13820 Encounter for screening for osteoporosis: Secondary | ICD-10-CM | POA: Diagnosis not present

## 2017-06-10 DIAGNOSIS — Z6821 Body mass index (BMI) 21.0-21.9, adult: Secondary | ICD-10-CM | POA: Diagnosis not present

## 2017-06-10 DIAGNOSIS — R8290 Unspecified abnormal findings in urine: Secondary | ICD-10-CM | POA: Diagnosis not present

## 2017-06-10 DIAGNOSIS — Z1211 Encounter for screening for malignant neoplasm of colon: Secondary | ICD-10-CM | POA: Diagnosis not present

## 2017-06-10 DIAGNOSIS — D649 Anemia, unspecified: Secondary | ICD-10-CM | POA: Diagnosis not present

## 2017-06-10 DIAGNOSIS — Z940 Kidney transplant status: Secondary | ICD-10-CM | POA: Diagnosis not present

## 2017-06-10 DIAGNOSIS — N183 Chronic kidney disease, stage 3 (moderate): Secondary | ICD-10-CM | POA: Diagnosis not present

## 2017-06-16 DIAGNOSIS — L821 Other seborrheic keratosis: Secondary | ICD-10-CM | POA: Diagnosis not present

## 2017-06-16 DIAGNOSIS — D225 Melanocytic nevi of trunk: Secondary | ICD-10-CM | POA: Diagnosis not present

## 2017-06-16 DIAGNOSIS — L816 Other disorders of diminished melanin formation: Secondary | ICD-10-CM | POA: Diagnosis not present

## 2017-06-24 DIAGNOSIS — Z79899 Other long term (current) drug therapy: Secondary | ICD-10-CM | POA: Diagnosis not present

## 2017-06-24 DIAGNOSIS — Z940 Kidney transplant status: Secondary | ICD-10-CM | POA: Diagnosis not present

## 2017-06-26 DIAGNOSIS — Z1231 Encounter for screening mammogram for malignant neoplasm of breast: Secondary | ICD-10-CM | POA: Diagnosis not present

## 2017-06-26 DIAGNOSIS — N814 Uterovaginal prolapse, unspecified: Secondary | ICD-10-CM | POA: Diagnosis not present

## 2017-07-24 DIAGNOSIS — N814 Uterovaginal prolapse, unspecified: Secondary | ICD-10-CM | POA: Diagnosis not present

## 2017-08-14 DIAGNOSIS — Z5181 Encounter for therapeutic drug level monitoring: Secondary | ICD-10-CM | POA: Diagnosis not present

## 2017-08-14 DIAGNOSIS — N183 Chronic kidney disease, stage 3 (moderate): Secondary | ICD-10-CM | POA: Diagnosis not present

## 2017-08-14 DIAGNOSIS — R7303 Prediabetes: Secondary | ICD-10-CM | POA: Diagnosis not present

## 2017-08-14 DIAGNOSIS — I129 Hypertensive chronic kidney disease with stage 1 through stage 4 chronic kidney disease, or unspecified chronic kidney disease: Secondary | ICD-10-CM | POA: Diagnosis not present

## 2017-08-14 DIAGNOSIS — Z792 Long term (current) use of antibiotics: Secondary | ICD-10-CM | POA: Diagnosis not present

## 2017-08-14 DIAGNOSIS — Z940 Kidney transplant status: Secondary | ICD-10-CM | POA: Diagnosis not present

## 2017-08-14 DIAGNOSIS — D8989 Other specified disorders involving the immune mechanism, not elsewhere classified: Secondary | ICD-10-CM | POA: Diagnosis not present

## 2017-08-14 DIAGNOSIS — Z79899 Other long term (current) drug therapy: Secondary | ICD-10-CM | POA: Diagnosis not present

## 2017-08-19 IMAGING — MG 2D DIGITAL DIAGNOSTIC UNILATERAL LEFT MAMMOGRAM WITH CAD AND ADJ
6 series · 6 of 18 positions shown · non-contrast
Comparison: Previous exam(s).

CLINICAL DATA: Screening recall for asymmetry seen in the left
breast on the MLO view only.

EXAM:
2D DIGITAL DIAGNOSTIC UNILATERAL LEFT MAMMOGRAM WITH CAD AND ADJUNCT
TOMO

[L ML]
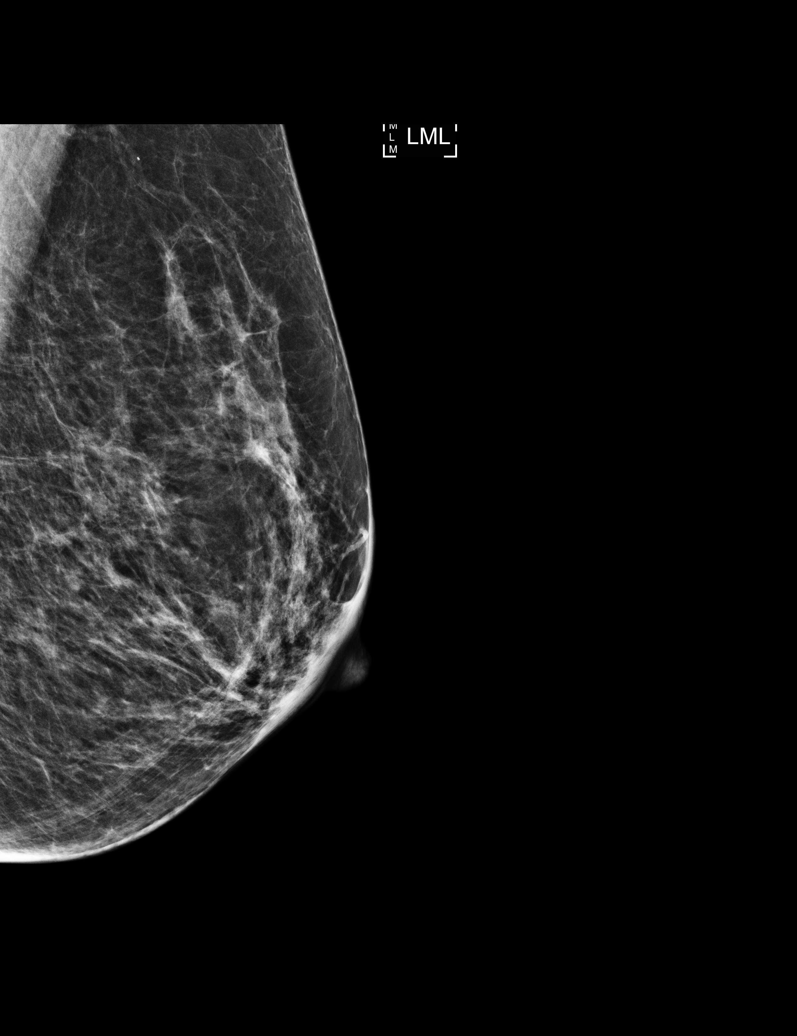

[L MLO (1 of 2)]
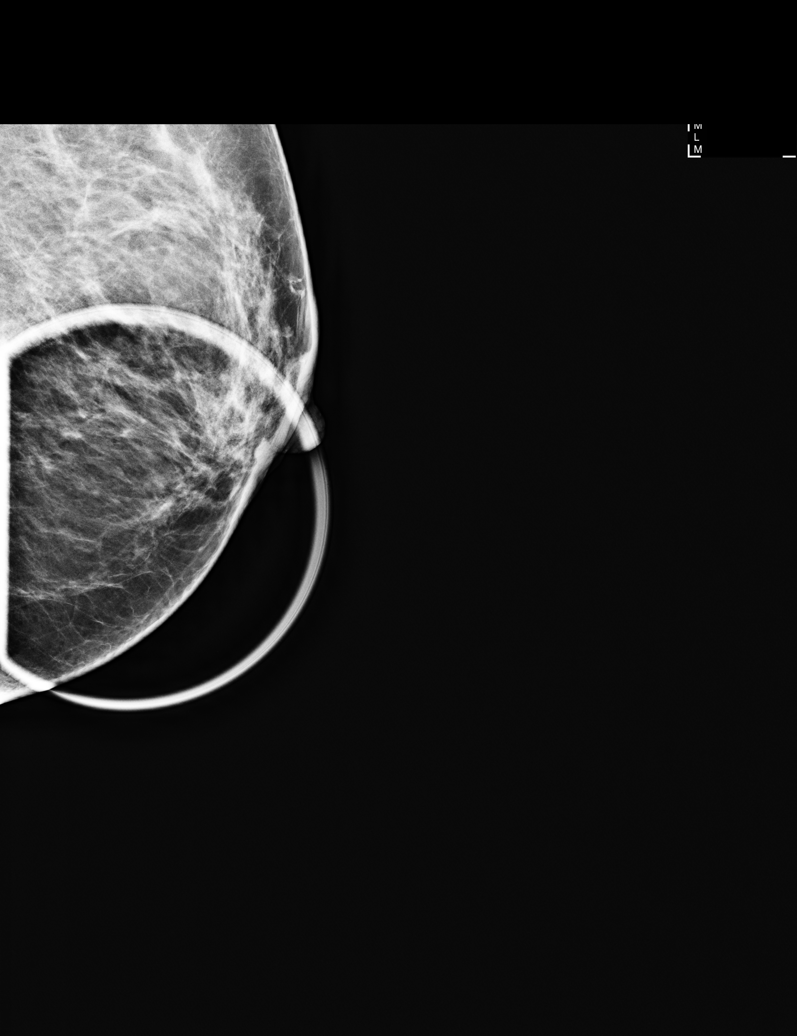

[L MLO (2 of 2)]
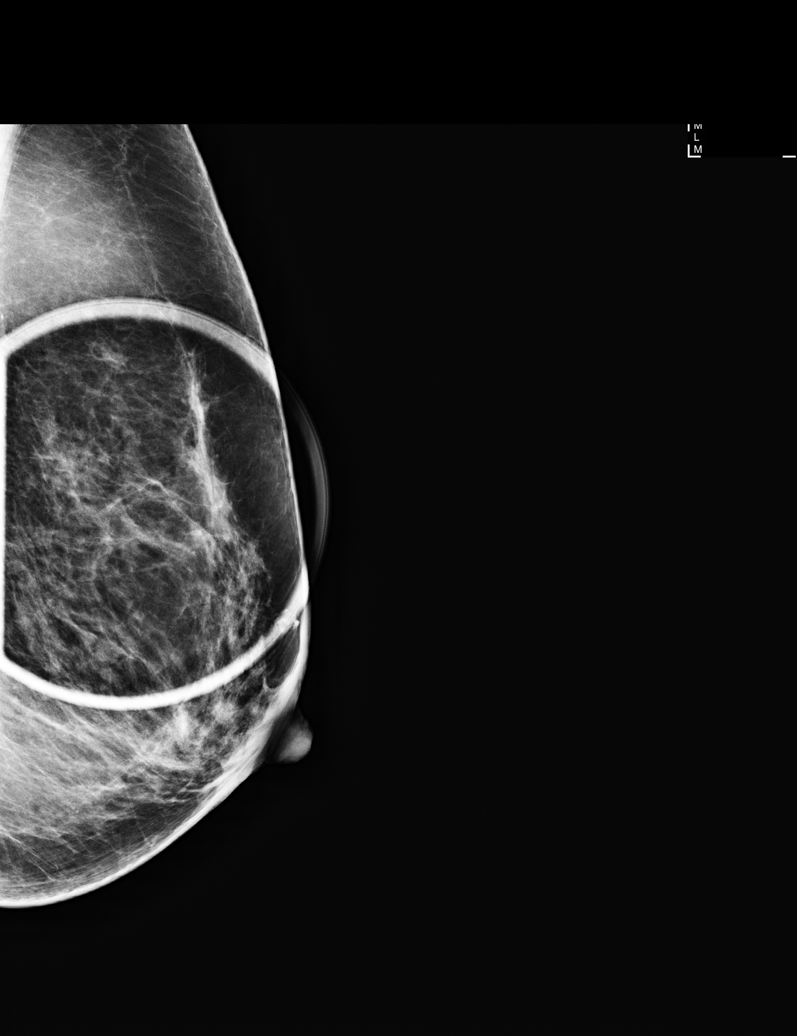

[L ML tomo · tomo slice 25/50.0]
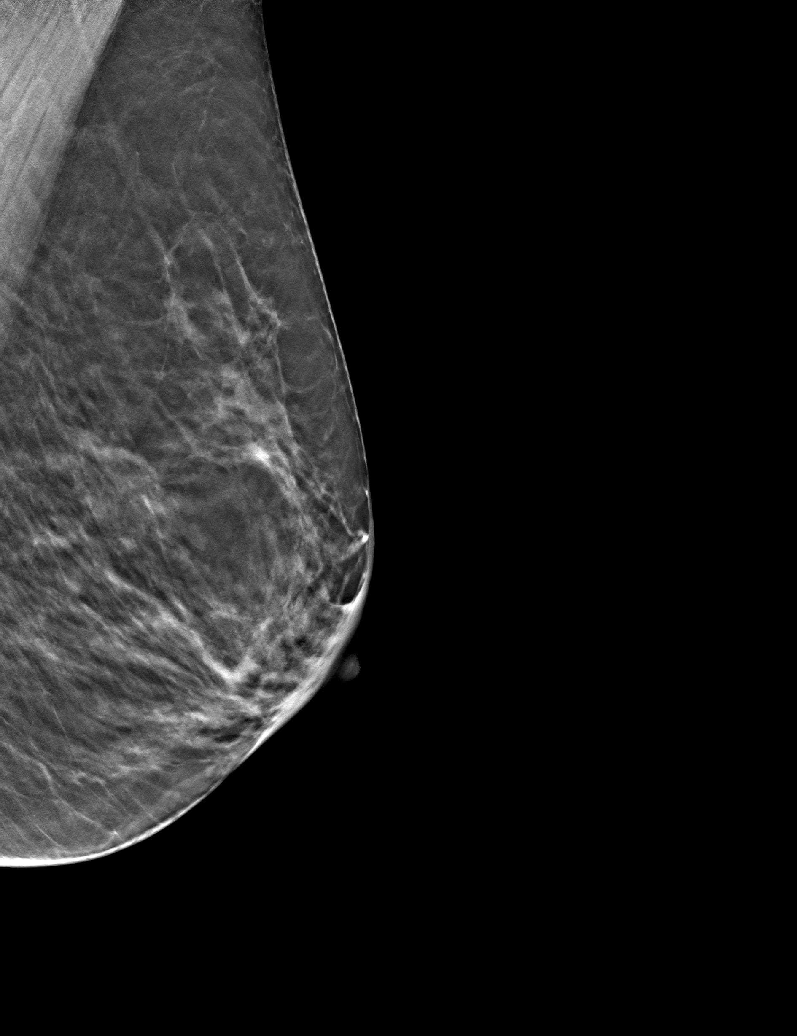

[L MLO tomo (1 of 2) · tomo slice 26/51.0]
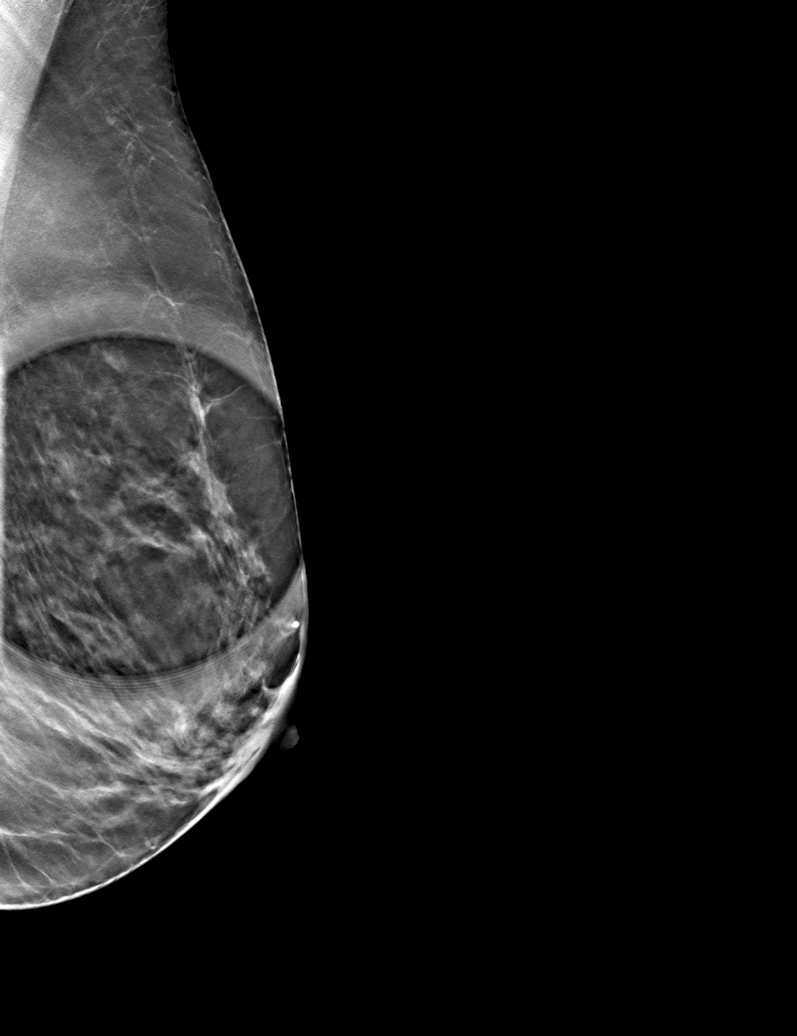

[L MLO tomo (2 of 2) · tomo slice 23/45.0]
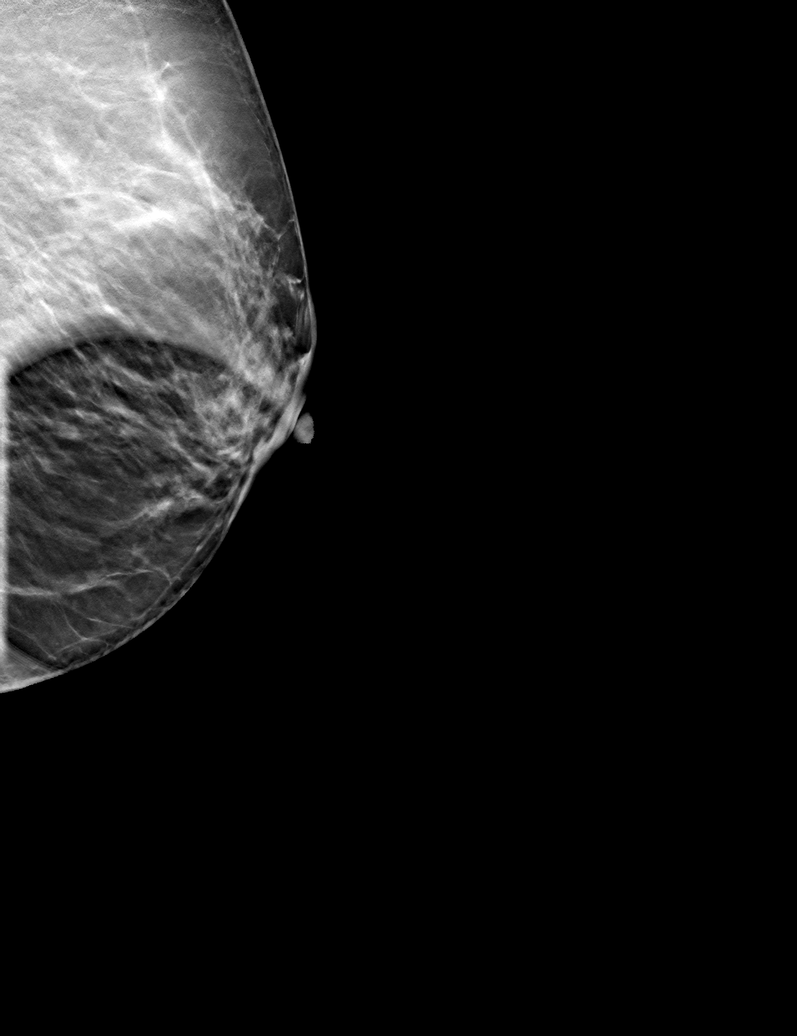

[6 of 18 positions shown; findings below may reference images not displayed]

ACR Breast Density Category b: There are scattered areas of
fibroglandular density.
FINDINGS: ML and spot compression MLO tomograms were performed of the left
breast. The initially questioned left breast asymmetry is resolves
on the additional imaging with the fibroglandular pattern in the
left breast stable dating back to 5511. There is no mammographic
evidence of malignancy in the left breast.

Mammographic images were processed with CAD.
IMPRESSION: No mammographic evidence of malignancy in the left breast.

RECOMMENDATION:
Screening mammogram in one year.(Code:[id number]-F-WKA)

I have discussed the findings and recommendations with the patient.
Results were also provided in writing at the conclusion of the
visit. If applicable, a reminder letter will be sent to the patient
regarding the next appointment.

BI-RADS CATEGORY  1: Negative.

## 2017-10-17 DIAGNOSIS — Z940 Kidney transplant status: Secondary | ICD-10-CM | POA: Diagnosis not present

## 2017-10-23 DIAGNOSIS — N814 Uterovaginal prolapse, unspecified: Secondary | ICD-10-CM | POA: Diagnosis not present

## 2017-11-03 DIAGNOSIS — H401113 Primary open-angle glaucoma, right eye, severe stage: Secondary | ICD-10-CM | POA: Diagnosis not present

## 2017-11-03 DIAGNOSIS — H25013 Cortical age-related cataract, bilateral: Secondary | ICD-10-CM | POA: Diagnosis not present

## 2017-11-03 DIAGNOSIS — H401122 Primary open-angle glaucoma, left eye, moderate stage: Secondary | ICD-10-CM | POA: Diagnosis not present

## 2017-11-03 DIAGNOSIS — H11153 Pinguecula, bilateral: Secondary | ICD-10-CM | POA: Diagnosis not present

## 2017-11-03 DIAGNOSIS — H2513 Age-related nuclear cataract, bilateral: Secondary | ICD-10-CM | POA: Diagnosis not present

## 2017-11-28 DIAGNOSIS — Z79891 Long term (current) use of opiate analgesic: Secondary | ICD-10-CM | POA: Diagnosis not present

## 2017-11-28 DIAGNOSIS — N342 Other urethritis: Secondary | ICD-10-CM | POA: Diagnosis not present

## 2017-11-28 DIAGNOSIS — Z1389 Encounter for screening for other disorder: Secondary | ICD-10-CM | POA: Diagnosis not present

## 2017-11-28 DIAGNOSIS — Z940 Kidney transplant status: Secondary | ICD-10-CM | POA: Diagnosis not present

## 2017-11-28 DIAGNOSIS — Z6820 Body mass index (BMI) 20.0-20.9, adult: Secondary | ICD-10-CM | POA: Diagnosis not present

## 2017-11-28 DIAGNOSIS — R350 Frequency of micturition: Secondary | ICD-10-CM | POA: Diagnosis not present

## 2017-11-28 DIAGNOSIS — R7309 Other abnormal glucose: Secondary | ICD-10-CM | POA: Diagnosis not present

## 2017-12-15 DIAGNOSIS — I10 Essential (primary) hypertension: Secondary | ICD-10-CM | POA: Diagnosis not present

## 2017-12-15 DIAGNOSIS — D72819 Decreased white blood cell count, unspecified: Secondary | ICD-10-CM | POA: Diagnosis not present

## 2017-12-15 DIAGNOSIS — Z940 Kidney transplant status: Secondary | ICD-10-CM | POA: Diagnosis not present

## 2017-12-15 DIAGNOSIS — N183 Chronic kidney disease, stage 3 (moderate): Secondary | ICD-10-CM | POA: Diagnosis not present

## 2017-12-15 DIAGNOSIS — Z792 Long term (current) use of antibiotics: Secondary | ICD-10-CM | POA: Diagnosis not present

## 2017-12-15 DIAGNOSIS — Z5181 Encounter for therapeutic drug level monitoring: Secondary | ICD-10-CM | POA: Diagnosis not present

## 2017-12-15 DIAGNOSIS — N390 Urinary tract infection, site not specified: Secondary | ICD-10-CM | POA: Diagnosis not present

## 2017-12-15 DIAGNOSIS — Z79899 Other long term (current) drug therapy: Secondary | ICD-10-CM | POA: Diagnosis not present

## 2017-12-19 DIAGNOSIS — R350 Frequency of micturition: Secondary | ICD-10-CM | POA: Diagnosis not present

## 2018-05-11 DIAGNOSIS — H401122 Primary open-angle glaucoma, left eye, moderate stage: Secondary | ICD-10-CM | POA: Diagnosis not present

## 2018-05-11 DIAGNOSIS — H401113 Primary open-angle glaucoma, right eye, severe stage: Secondary | ICD-10-CM | POA: Diagnosis not present

## 2018-05-15 DIAGNOSIS — I10 Essential (primary) hypertension: Secondary | ICD-10-CM | POA: Diagnosis not present

## 2018-05-15 DIAGNOSIS — Z6820 Body mass index (BMI) 20.0-20.9, adult: Secondary | ICD-10-CM | POA: Diagnosis not present

## 2018-05-15 DIAGNOSIS — E7849 Other hyperlipidemia: Secondary | ICD-10-CM | POA: Diagnosis not present

## 2018-05-15 DIAGNOSIS — E119 Type 2 diabetes mellitus without complications: Secondary | ICD-10-CM | POA: Diagnosis not present

## 2018-05-15 DIAGNOSIS — Z0001 Encounter for general adult medical examination with abnormal findings: Secondary | ICD-10-CM | POA: Diagnosis not present

## 2018-05-15 DIAGNOSIS — Z1389 Encounter for screening for other disorder: Secondary | ICD-10-CM | POA: Diagnosis not present

## 2018-05-18 ENCOUNTER — Telehealth: Payer: Self-pay

## 2018-05-18 NOTE — Telephone Encounter (Signed)
NOTES ON FILE RJ

## 2018-06-08 DIAGNOSIS — R195 Other fecal abnormalities: Secondary | ICD-10-CM | POA: Diagnosis not present

## 2018-06-08 DIAGNOSIS — R194 Change in bowel habit: Secondary | ICD-10-CM | POA: Diagnosis not present

## 2018-06-09 ENCOUNTER — Ambulatory Visit: Payer: PPO | Admitting: Cardiology

## 2018-06-12 DIAGNOSIS — Z940 Kidney transplant status: Secondary | ICD-10-CM | POA: Diagnosis not present

## 2018-06-12 DIAGNOSIS — Z79899 Other long term (current) drug therapy: Secondary | ICD-10-CM | POA: Diagnosis not present

## 2018-06-16 DIAGNOSIS — D225 Melanocytic nevi of trunk: Secondary | ICD-10-CM | POA: Diagnosis not present

## 2018-06-16 DIAGNOSIS — L821 Other seborrheic keratosis: Secondary | ICD-10-CM | POA: Diagnosis not present

## 2018-06-16 DIAGNOSIS — L816 Other disorders of diminished melanin formation: Secondary | ICD-10-CM | POA: Diagnosis not present

## 2018-06-19 DIAGNOSIS — Z792 Long term (current) use of antibiotics: Secondary | ICD-10-CM | POA: Diagnosis not present

## 2018-06-19 DIAGNOSIS — Z79899 Other long term (current) drug therapy: Secondary | ICD-10-CM | POA: Diagnosis not present

## 2018-06-19 DIAGNOSIS — D72819 Decreased white blood cell count, unspecified: Secondary | ICD-10-CM | POA: Diagnosis not present

## 2018-06-19 DIAGNOSIS — Z7952 Long term (current) use of systemic steroids: Secondary | ICD-10-CM | POA: Diagnosis not present

## 2018-06-19 DIAGNOSIS — I10 Essential (primary) hypertension: Secondary | ICD-10-CM | POA: Diagnosis not present

## 2018-06-19 DIAGNOSIS — Z5181 Encounter for therapeutic drug level monitoring: Secondary | ICD-10-CM | POA: Diagnosis not present

## 2018-06-19 DIAGNOSIS — Z940 Kidney transplant status: Secondary | ICD-10-CM | POA: Diagnosis not present

## 2018-06-19 DIAGNOSIS — Z4822 Encounter for aftercare following kidney transplant: Secondary | ICD-10-CM | POA: Diagnosis not present

## 2018-06-29 DIAGNOSIS — F064 Anxiety disorder due to known physiological condition: Secondary | ICD-10-CM | POA: Diagnosis not present

## 2018-06-29 DIAGNOSIS — E038 Other specified hypothyroidism: Secondary | ICD-10-CM | POA: Diagnosis not present

## 2018-06-29 DIAGNOSIS — T8610 Unspecified complication of kidney transplant: Secondary | ICD-10-CM | POA: Diagnosis not present

## 2018-06-29 DIAGNOSIS — E1169 Type 2 diabetes mellitus with other specified complication: Secondary | ICD-10-CM | POA: Diagnosis not present

## 2018-06-29 DIAGNOSIS — Z7189 Other specified counseling: Secondary | ICD-10-CM | POA: Diagnosis not present

## 2018-06-29 DIAGNOSIS — Z72820 Sleep deprivation: Secondary | ICD-10-CM | POA: Diagnosis not present

## 2018-07-06 DIAGNOSIS — M1611 Unilateral primary osteoarthritis, right hip: Secondary | ICD-10-CM | POA: Diagnosis not present

## 2018-07-06 DIAGNOSIS — E038 Other specified hypothyroidism: Secondary | ICD-10-CM | POA: Diagnosis not present

## 2018-07-06 DIAGNOSIS — M87051 Idiopathic aseptic necrosis of right femur: Secondary | ICD-10-CM | POA: Diagnosis not present

## 2018-07-06 DIAGNOSIS — M8580 Other specified disorders of bone density and structure, unspecified site: Secondary | ICD-10-CM | POA: Diagnosis not present

## 2018-07-06 DIAGNOSIS — E785 Hyperlipidemia, unspecified: Secondary | ICD-10-CM | POA: Diagnosis not present

## 2018-07-06 DIAGNOSIS — E1169 Type 2 diabetes mellitus with other specified complication: Secondary | ICD-10-CM | POA: Diagnosis not present

## 2018-07-06 DIAGNOSIS — I10 Essential (primary) hypertension: Secondary | ICD-10-CM | POA: Diagnosis not present

## 2018-07-06 DIAGNOSIS — F064 Anxiety disorder due to known physiological condition: Secondary | ICD-10-CM | POA: Diagnosis not present

## 2018-07-06 DIAGNOSIS — E039 Hypothyroidism, unspecified: Secondary | ICD-10-CM | POA: Diagnosis not present

## 2018-08-17 DIAGNOSIS — F064 Anxiety disorder due to known physiological condition: Secondary | ICD-10-CM | POA: Diagnosis not present

## 2018-08-17 DIAGNOSIS — T8610 Unspecified complication of kidney transplant: Secondary | ICD-10-CM | POA: Diagnosis not present

## 2018-08-17 DIAGNOSIS — E1169 Type 2 diabetes mellitus with other specified complication: Secondary | ICD-10-CM | POA: Diagnosis not present

## 2018-08-18 DIAGNOSIS — K5730 Diverticulosis of large intestine without perforation or abscess without bleeding: Secondary | ICD-10-CM | POA: Diagnosis not present

## 2018-08-18 DIAGNOSIS — R195 Other fecal abnormalities: Secondary | ICD-10-CM | POA: Diagnosis not present

## 2018-08-19 DIAGNOSIS — R7303 Prediabetes: Secondary | ICD-10-CM | POA: Diagnosis not present

## 2018-08-19 DIAGNOSIS — R7309 Other abnormal glucose: Secondary | ICD-10-CM | POA: Diagnosis not present

## 2018-08-19 DIAGNOSIS — Z79899 Other long term (current) drug therapy: Secondary | ICD-10-CM | POA: Diagnosis not present

## 2018-08-19 DIAGNOSIS — Z4822 Encounter for aftercare following kidney transplant: Secondary | ICD-10-CM | POA: Diagnosis not present

## 2018-08-19 DIAGNOSIS — Z940 Kidney transplant status: Secondary | ICD-10-CM | POA: Diagnosis not present

## 2018-08-19 DIAGNOSIS — D8989 Other specified disorders involving the immune mechanism, not elsewhere classified: Secondary | ICD-10-CM | POA: Diagnosis not present

## 2018-08-19 DIAGNOSIS — E7849 Other hyperlipidemia: Secondary | ICD-10-CM | POA: Diagnosis not present

## 2018-09-17 DIAGNOSIS — Z1231 Encounter for screening mammogram for malignant neoplasm of breast: Secondary | ICD-10-CM | POA: Diagnosis not present

## 2018-11-06 ENCOUNTER — Other Ambulatory Visit: Payer: Self-pay | Admitting: Pharmacist

## 2018-11-06 NOTE — Patient Outreach (Addendum)
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  HIPAA identifiers verified. Patient states she has difficulty affording her immunosuppressant medications (tacrolimus, mycophenolate), eye drops, and a new diabetes medication, Januvia.    Per review of [facility] formulary, tacrolimus and mycophenolate are both Tier 2 and Bromonidine is Tier 2.  Januvia is Tier 3.   Patient reports she uses [facility] on [address].   Objective: The 10-year ASCVD risk score (Goff DC Jr., et al., 2013) is: 18%   Values used to calculate the score:     Age: 68 years     Sex: Female     Is Non-Hispanic African American: Yes     Diabetic: Yes     Tobacco smoker: No     Systolic Blood Pressure: 110 mmHg     Is BP treated: Yes     HDL Cholesterol: 60 MG/DL     Total Cholesterol: 238 MG/DL  Lab Results  Component Value Date   CREATININE 1.15 (H) 12/06/2011   CREATININE 6.0 (H) 08/12/2009    No results found for: HGBA1C  Lipid Panel  No results found for: CHOL, TRIG, HDL, CHOLHDL, VLDL, LDLCALC, LDLDIRECT  BP Readings from Last 3 Encounters:  10/24/16 108/67  12/10/11 129/74  12/06/11 110/64    No Known Allergies  Medications Reviewed Today    Reviewed by Crenshaw, Brian S, MD (Physician) on 12/10/11 at 1224  Med List Status: <None>  Medication Order Taking? Sig Documenting Provider Last Dose Status Informant  aspirin 81 MG tablet [id number] Yes Take 81 mg by mouth daily. [provider] Taking Active Self  brimonidine (ALPHAGAN P) 0.1 % SOLN [id number] Yes Place 1 drop into both eyes 2 (two) times daily. [provider] Taking Active  Self  levofloxacin (LEVAQUIN) 250 MG tablet [id number] Yes Take 250 mg by mouth daily. [provider] Taking Active Self  levothyroxine (SYNTHROID, LEVOTHROID) 112 MCG tablet [id number] Yes Take 112 mcg by mouth daily. [provider] Taking Active Self  metoprolol tartrate (LOPRESSOR) 25 MG tablet [id number] Yes Take 25 mg by mouth 2 (two) times daily. [provider] Taking Active Self  mycophenolate (MYFORTIC) 180 MG EC tablet [id number] Yes Take 180 mg by mouth 2 (two) times daily. [provider] Taking Active Self  Omega-3 Fatty Acids (FISH OIL) 1200 MG CAPS [id number] Yes Take 2 capsules by mouth daily. [provider] Taking Active Self  sulfamethoxazole-trimethoprim (BACTRIM,SEPTRA) 400-80 MG per tablet [id number] Yes Take 1 tablet by mouth every Monday, Wednesday, and Friday. [provider] Taking Active Self  tacrolimus (PROGRAF) 0.5 MG capsule [id number] Yes Take 0.5 mg by mouth every morning. Takes 0.5mg in addition to the 1mg tablet in the morning [provider] Taking Active Self  tacrolimus (PROGRAF) 1 MG capsule [id number] Yes Take 1-2 mg by mouth 2 (two) times daily. Pt takes 1 tablet (1mg) in morning and 2 tablets (2mg) in evening [provider] Taking Active Self  Travoprost, BAK Free, (TRAVATAN) 0.004 % SOLN ophthalmic solution [id number] Yes Place 1 drop into both eyes at bedtime. [provider] Taking Active Self          Assessment: Unable to review   all medications telephonically today.   Medication Assistance Findings:  Medication assistance needs identified: Tacrolimus, mycophenolate, brimonidine, Januvia   Per [facility]:   -Brimonidine: Tier 2 $24 / 56 day supply -Tracrolimus and Mycophenolate billed to Medicare Part B  -Tacrolimus 1mg = $49.84 / 90 day supply  -Tacrolimus 0.5mg = $7.11 / 90 day supply  -Mycophenolate $75.98 / 30 day supply  Extra Help:  Not eligible for Extra Help Low Income Subsidy based on  reported income and assets  Patient Assistance Programs: Januvia made by [employer] o Income requirement met: Yes o Out-of-pocket prescription expenditure met:   Not Applicable - Patient has met application requirements to apply for this program.  - Reviewed program requirements with patient.    Alphagan P made by [employer] o Income requirement met: Unknown o Out-of-pocket prescription expenditure met:   Not Applicable - Patient has not met application requirements to apply for this program at this time.  as she has a co-pay for medication.  This program will deny most applications if medication covered by insurance even if co-pay high.  - Can request a Tier Reduction to reduce to a Tier 1 co-pay  Myfortic made by [employer] o Income requirement met: yet o Out-of-pocket prescription expenditure met:   Not Applicable - Patient has met application requirements to apply for this program -   No patient assistance programs for tacrolimus and no other assistance programs available since this is billed to Medicare Part B due to transplant diagnosis  Plan: . I will route patient assistance letter to [facility] pharmacy technician who will coordinate patient assistance program application process for medications listed above.  [facility] pharmacy technician will assist with obtaining all required documents from both patient and provider(s) and submit application(s) once completed.   . Will send Tier Request form for brimonidine to eye doctor once I confirm provider  Colleen Summe, PharmD, BCPS Clinical Pharmacist [facility] [phone]    Addendum: Incoming call from patient.  Dr. Peter Dunn is prescriber for eye drops. Fax # to office is [phone].  Office aware to expect fax regarding Tier Exception Form.  Will f/u approval next week.   Colleen Summe, PharmD, BCPS Clinical Pharmacist [facility] [phone]      

## 2018-11-09 ENCOUNTER — Ambulatory Visit: Payer: Self-pay | Admitting: Pharmacist

## 2018-11-09 ENCOUNTER — Other Ambulatory Visit: Payer: Self-pay | Admitting: Pharmacy Technician

## 2018-11-09 NOTE — Patient Outreach (Signed)
[place] [facility]) Care Management  11/09/2018  Michelle Rubio 1950-08-23 [id number]                          Medication Assistance Referral  Referral From: [place]  Medication/Company: Myfortic / [employer] Patient application portion:  Education officer, museum portion: Faxed  to D. Felts, NP  Medication/Company: Celesta Gentile / [employer] Patient application portion:  Education officer, museum portion:  Mailed to Dr. Clayburn Pert   Follow up:  Will follow up with patient in 7-10 business days to confirm application(s) have been received.  [name] [name] [place] Certified Pharmacy Technician [place] Management Direct Dial:[phone]

## 2018-11-13 ENCOUNTER — Other Ambulatory Visit: Payer: Self-pay | Admitting: Pharmacist

## 2018-11-13 ENCOUNTER — Ambulatory Visit: Payer: Self-pay | Admitting: Pharmacist

## 2018-11-13 NOTE — Patient Outreach (Signed)
[place] [facility]) Care Management  [place]   11/13/2018  MITCHELLE SULTAN 1950-06-17 [id number]  Successful call to patient.  Patient received applications and completed them.  She states she mailed them back to [facility] yesterday.  She did not submit proof of income however.  We reviewed possible forms.  Patient stated she will be able to fax [facility] office the correct document later today.  Patient also states she went to the pharmacy earlier this week to pick up Mycophenolate and co-pay reduced to $0.  She has not heard back from provider regarding Tier Exception request for eyedrops.  I am not sure what made patient's co-pay reduce to $0 as she would still have a low co-pay with catastrophic phase. I will reach out to HTA regarding co-pay change.   Ralene Bathe, PharmD, [place] [phone]

## 2018-11-17 ENCOUNTER — Other Ambulatory Visit: Payer: Self-pay | Admitting: Pharmacist

## 2018-11-17 ENCOUNTER — Other Ambulatory Visit: Payer: Self-pay | Admitting: Pharmacy Technician

## 2018-11-17 ENCOUNTER — Ambulatory Visit: Payer: Self-pay | Admitting: Pharmacist

## 2018-11-17 DIAGNOSIS — E7800 Pure hypercholesterolemia, unspecified: Secondary | ICD-10-CM | POA: Diagnosis not present

## 2018-11-17 DIAGNOSIS — E785 Hyperlipidemia, unspecified: Secondary | ICD-10-CM | POA: Diagnosis not present

## 2018-11-17 DIAGNOSIS — I10 Essential (primary) hypertension: Secondary | ICD-10-CM | POA: Diagnosis not present

## 2018-11-17 DIAGNOSIS — E1169 Type 2 diabetes mellitus with other specified complication: Secondary | ICD-10-CM | POA: Diagnosis not present

## 2018-11-17 NOTE — Patient Outreach (Signed)
[place] [facility]) Care Management  [place] 11/17/2018  AYSA LARIVEE 21-Jan-1951 [id number]  Update from HTA:  Patient has met out-of-pocket expenditure for Medicare Part B therefore all co-pays will be $0 for the rest of the year for Medicare Part B claims which include mycophenolate and tacrolimus.    Successful call to patient today to relay information.  Patient voiced understanding.  We will continue with patient assistance programs for now per patient request.    Ralene Bathe, PharmD, [place] [phone]

## 2018-11-17 NOTE — Patient Outreach (Signed)
[place] [facility]) Care Management  11/17/2018  HEYDI SWANGO 09/18/1950 [id number]   Received patient portion(s) of patient assistance application for [employer]. Faxed completed application and required documents into [employer].  Will follow up with company in 5-7 business days to check status of application.  Have not received provider portion of [employer] application for [employer]. Provider portion is required to be mailed back in. Will follow up with providers office in 3-5 business days if form has not not been received.  [name] [name] [place] Certified Pharmacy Technician [place] Management Direct Dial:[phone]

## 2018-11-23 ENCOUNTER — Other Ambulatory Visit: Payer: Self-pay | Admitting: Pharmacy Technician

## 2018-11-23 NOTE — Patient Outreach (Signed)
[place] [facility]) Care Management  11/23/2018  DONIKA BUTNER 1950/10/07 [id number]   Received provider portion(s) of patient assistance application for Januvia. Prepared to mail completed application and required documents into [employer].  Will follow up with company in 10-14 business days to check status of application.  [name] [name] [place] Certified Pharmacy Technician [place] Management Direct Dial:[phone]

## 2018-11-25 ENCOUNTER — Other Ambulatory Visit: Payer: Self-pay | Admitting: Pharmacy Technician

## 2018-11-25 NOTE — Patient Outreach (Signed)
[place] [facility]) Care Management  11/25/2018  Michelle Rubio [birth year] [id number]    Follow up call placed to [employer] regarding patient assistance application(s) for Myfortic , [name] confirms patient has been approved as of 8/20 until 04/01/19. [name] states that patient can call in to set up shipment of medication.   Unsuccessful call placed to patient regarding patient assistance update for [employer], HIPAA compliant voicemail left.   Follow up:  Will follow up with patient in 5-7 business days if call has not been returned.  [name] [name] [place] Certified Pharmacy Technician [place] Management Direct Dial:[phone]

## 2018-11-26 ENCOUNTER — Other Ambulatory Visit: Payer: Self-pay | Admitting: Pharmacy Technician

## 2018-11-26 NOTE — Patient Outreach (Signed)
[place] [facility]) Care Management  11/26/2018  Michelle Rubio [birth year] [id number]    Return call placed to patient regarding patient assistance update for Myfortic and Januvia, HIPAA identifiers verified. [name] confirms that [employer] outreached and set her Myfortic shipment to be delivered on  8/28.  Informed [name] that I Scientist, water quality for [place] and that I will follow up with them in 7-10 business days.  [name] [name] [place] Certified Pharmacy Technician [place] Management Direct Dial:[phone]

## 2018-12-08 ENCOUNTER — Other Ambulatory Visit: Payer: Self-pay | Admitting: Pharmacy Technician

## 2018-12-08 NOTE — Patient Outreach (Signed)
[place] [facility] Care Management  12/08/2018  JANITZA REVUELTA 1950-10-18 [id number]    Follow up call placed to [employer] regarding patient assistance application(s) for Rickey Barbara confirms application has been received. Attestation form was mailed out to patient to complete on 9/3. Clifton James states that on providers portion of application city and zipcode will need to be completed.   Successful call placed to patient regarding patient assistance receipt of attestation form from [place] for [employer], HIPAA identifiers verified. Ms. Markgraf states that she has not received the form in mail as of yet. Requested that she contact me once letter has arrived so that I can assist her with completing. She stated that she would.  Follow up:  Will follow up with patient in 3-5 business days if call has not been returned.  [name] [name] [place] Certified Pharmacy Technician [place] Management Direct Dial:[phone]

## 2018-12-11 ENCOUNTER — Other Ambulatory Visit: Payer: Self-pay | Admitting: Pharmacy Technician

## 2018-12-11 NOTE — Patient Outreach (Signed)
[place] [facility]) Care Management  12/11/2018  Michelle Rubio Jun 20, 1950 [id number]    Incoming call from patient regarding patient assistance receipt of attestation form from [place] for [employer], HIPAA identifiers verified. Assisted [name] with completing attestation form.  Follow up:  Will follow up with [employer] in 7-10 business days to check application status.  [name] [name] [place] Certified Pharmacy Technician [place] Management Direct Dial:[phone]

## 2018-12-23 ENCOUNTER — Other Ambulatory Visit: Payer: Self-pay | Admitting: Pharmacy Technician

## 2018-12-23 NOTE — Patient Outreach (Signed)
[place] [facility]) Care Management  12/23/2018  Michelle Rubio 06-02-1950 [id number]    Successful call placed to [employer] regarding patient assistance application(s) for [name] confirms patient has been approved as if 9/16 until 04/01/19. Medication to be delivered to patients home in 10-14 business days.  Follow up:  Will follow up with patient in 14-21 business days to confirm medication has been received.  [name] [name] [place] Certified Pharmacy Technician [place] Management Direct [phone]

## 2019-01-01 ENCOUNTER — Other Ambulatory Visit: Payer: Self-pay | Admitting: Pharmacy Technician

## 2019-01-01 NOTE — Patient Outreach (Signed)
[place] [facility]) Care Management  01/01/2019  CAROLAN AVEDISIAN 06-06-1950 [id number]   Incoming call from patient stating she has not received Januvia from [employer] patient assistance.  Outreach call made to [employer], Bryson Ha states medication was shipped out on 9/23 and usually takes about 7-10 business days to arrive at patients home. Daria also states that they have been informed that some shipments are taking longer due to delays in mailing.  Return call made to Ms. Wynetta Emery, HIPAA identifiers verifed. Informed patient of above details.  Will follow up with patient in 5-7 business days to confirm medication has been received.  [name] [name] [place] Certified Pharmacy Technician [place] Management Direct Dial:[phone]

## 2019-01-04 DIAGNOSIS — H401122 Primary open-angle glaucoma, left eye, moderate stage: Secondary | ICD-10-CM | POA: Diagnosis not present

## 2019-01-04 DIAGNOSIS — H401113 Primary open-angle glaucoma, right eye, severe stage: Secondary | ICD-10-CM | POA: Diagnosis not present

## 2019-01-06 DIAGNOSIS — I10 Essential (primary) hypertension: Secondary | ICD-10-CM | POA: Diagnosis not present

## 2019-01-06 DIAGNOSIS — Z4822 Encounter for aftercare following kidney transplant: Secondary | ICD-10-CM | POA: Diagnosis not present

## 2019-01-06 DIAGNOSIS — Z79899 Other long term (current) drug therapy: Secondary | ICD-10-CM | POA: Diagnosis not present

## 2019-01-06 DIAGNOSIS — B9789 Other viral agents as the cause of diseases classified elsewhere: Secondary | ICD-10-CM | POA: Diagnosis not present

## 2019-01-11 DIAGNOSIS — Z4689 Encounter for fitting and adjustment of other specified devices: Secondary | ICD-10-CM | POA: Diagnosis not present

## 2019-01-12 DIAGNOSIS — N189 Chronic kidney disease, unspecified: Secondary | ICD-10-CM | POA: Diagnosis not present

## 2019-01-12 DIAGNOSIS — Z79899 Other long term (current) drug therapy: Secondary | ICD-10-CM | POA: Diagnosis not present

## 2019-01-12 DIAGNOSIS — Z940 Kidney transplant status: Secondary | ICD-10-CM | POA: Diagnosis not present

## 2019-01-21 ENCOUNTER — Other Ambulatory Visit: Payer: Self-pay | Admitting: Pharmacy Technician

## 2019-01-21 NOTE — Patient Outreach (Signed)
[facility] [facility]) Care Management  01/21/2019  MILI PILTZ 11-Aug-1950 [id number]    Unsuccessful call placed to patient regarding patient assistance medication delivery of Januvia, HIPAA compliant voicemail left.   Follow up:  Will follow up with patient in 3-5 business days if call has not been returned.  [name] [name] [place] Certified Pharmacy Technician [place] Management Direct Dial:[phone]

## 2019-01-22 ENCOUNTER — Other Ambulatory Visit: Payer: Self-pay | Admitting: Pharmacist

## 2019-01-22 ENCOUNTER — Other Ambulatory Visit: Payer: Self-pay | Admitting: Pharmacy Technician

## 2019-01-22 NOTE — Patient Outreach (Signed)
[place] [facility]) Care Management  01/22/2019  SHATERA RENNERT 1951-02-26 [id number]    Incoming call from patient regarding patient assistance medication delivery of Januvia, HIPAA identifiers verified. Ms. Maxson confirms that she received a 90 supply of medication 2 days ago. Reviewed with her on how to obtain additional refills. Ms. Scholler confirms she has not additional questions at this time.  Follow up:  Will route note to [place] for case closure  [name]. [name] [place] Certified Pharmacy Technician [place] Management Direct Dial:[phone]

## 2019-01-22 NOTE — Patient Outreach (Signed)
[place] [facility]) Care Management  [place]   01/22/2019  Michelle Rubio 08/28/1950 [id number]  Patient has been approved for My[employer]ic and Januvia patient assistance program(s).  Patient has been instructed on how to order refills and renewal process for 2021.  Plan: -Will close [facility] pharmacy case -Goals of care have been met. -I have provided my contact information if patient or family needs to reach out to me in the future.  -Thank you for allowing [facility] pharmacy to be involved in this patient's care.    Ralene Bathe, PharmD, [place] [phone]

## 2019-03-16 DIAGNOSIS — R799 Abnormal finding of blood chemistry, unspecified: Secondary | ICD-10-CM | POA: Diagnosis not present

## 2019-03-16 DIAGNOSIS — E1169 Type 2 diabetes mellitus with other specified complication: Secondary | ICD-10-CM | POA: Diagnosis not present

## 2019-03-16 DIAGNOSIS — R7309 Other abnormal glucose: Secondary | ICD-10-CM | POA: Diagnosis not present

## 2019-03-16 DIAGNOSIS — F064 Anxiety disorder due to known physiological condition: Secondary | ICD-10-CM | POA: Diagnosis not present

## 2019-03-16 DIAGNOSIS — E785 Hyperlipidemia, unspecified: Secondary | ICD-10-CM | POA: Diagnosis not present

## 2019-03-16 DIAGNOSIS — I10 Essential (primary) hypertension: Secondary | ICD-10-CM | POA: Diagnosis not present

## 2019-03-19 DIAGNOSIS — E785 Hyperlipidemia, unspecified: Secondary | ICD-10-CM | POA: Diagnosis not present

## 2019-03-19 DIAGNOSIS — F064 Anxiety disorder due to known physiological condition: Secondary | ICD-10-CM | POA: Diagnosis not present

## 2019-03-19 DIAGNOSIS — E1169 Type 2 diabetes mellitus with other specified complication: Secondary | ICD-10-CM | POA: Diagnosis not present

## 2019-03-19 DIAGNOSIS — I10 Essential (primary) hypertension: Secondary | ICD-10-CM | POA: Diagnosis not present

## 2019-05-14 DIAGNOSIS — I10 Essential (primary) hypertension: Secondary | ICD-10-CM | POA: Diagnosis not present

## 2019-05-14 DIAGNOSIS — Z4822 Encounter for aftercare following kidney transplant: Secondary | ICD-10-CM | POA: Diagnosis not present

## 2019-05-14 DIAGNOSIS — H401113 Primary open-angle glaucoma, right eye, severe stage: Secondary | ICD-10-CM | POA: Diagnosis not present

## 2019-05-14 DIAGNOSIS — Z792 Long term (current) use of antibiotics: Secondary | ICD-10-CM | POA: Diagnosis not present

## 2019-05-14 DIAGNOSIS — B349 Viral infection, unspecified: Secondary | ICD-10-CM | POA: Diagnosis not present

## 2019-05-14 DIAGNOSIS — Z940 Kidney transplant status: Secondary | ICD-10-CM | POA: Diagnosis not present

## 2019-05-14 DIAGNOSIS — Z79899 Other long term (current) drug therapy: Secondary | ICD-10-CM | POA: Diagnosis not present

## 2019-05-14 DIAGNOSIS — H401122 Primary open-angle glaucoma, left eye, moderate stage: Secondary | ICD-10-CM | POA: Diagnosis not present

## 2019-05-19 DIAGNOSIS — E785 Hyperlipidemia, unspecified: Secondary | ICD-10-CM | POA: Diagnosis not present

## 2019-05-19 DIAGNOSIS — E1169 Type 2 diabetes mellitus with other specified complication: Secondary | ICD-10-CM | POA: Diagnosis not present

## 2019-05-19 DIAGNOSIS — E039 Hypothyroidism, unspecified: Secondary | ICD-10-CM | POA: Diagnosis not present

## 2019-05-19 DIAGNOSIS — I10 Essential (primary) hypertension: Secondary | ICD-10-CM | POA: Diagnosis not present

## 2019-05-21 DIAGNOSIS — I129 Hypertensive chronic kidney disease with stage 1 through stage 4 chronic kidney disease, or unspecified chronic kidney disease: Secondary | ICD-10-CM | POA: Diagnosis not present

## 2019-05-21 DIAGNOSIS — E038 Other specified hypothyroidism: Secondary | ICD-10-CM | POA: Diagnosis not present

## 2019-05-21 DIAGNOSIS — E1169 Type 2 diabetes mellitus with other specified complication: Secondary | ICD-10-CM | POA: Diagnosis not present

## 2019-05-21 DIAGNOSIS — E785 Hyperlipidemia, unspecified: Secondary | ICD-10-CM | POA: Diagnosis not present

## 2019-05-21 DIAGNOSIS — F064 Anxiety disorder due to known physiological condition: Secondary | ICD-10-CM | POA: Diagnosis not present

## 2019-05-21 DIAGNOSIS — I10 Essential (primary) hypertension: Secondary | ICD-10-CM | POA: Diagnosis not present

## 2019-05-28 DIAGNOSIS — H401122 Primary open-angle glaucoma, left eye, moderate stage: Secondary | ICD-10-CM | POA: Diagnosis not present

## 2019-05-28 DIAGNOSIS — H401113 Primary open-angle glaucoma, right eye, severe stage: Secondary | ICD-10-CM | POA: Diagnosis not present

## 2019-06-03 ENCOUNTER — Ambulatory Visit: Payer: PPO | Attending: Internal Medicine

## 2019-06-03 DIAGNOSIS — Z23 Encounter for immunization: Secondary | ICD-10-CM

## 2019-06-03 NOTE — Progress Notes (Signed)
   Covid-19 Vaccination Clinic  Name:  Michelle Rubio    MRN: [id number] DOB: [birth year]  06/03/2019  Michelle Rubio was observed post Covid-19 immunization for 15 minutes without incident. She was provided with Vaccine Information Sheet and instruction to access the V-Safe system.   Michelle Rubio was instructed to call 911 with any severe reactions post vaccine: Marland Kitchen Difficulty breathing  . Swelling of face and throat  . A fast heartbeat  . A bad rash all over body  . Dizziness and weakness   Immunizations Administered    Name Date Dose VIS Date Route   Pfizer COVID-19 Vaccine 06/03/2019  8:42 AM 0.3 mL 03/12/2019 Intramuscular   Manufacturer: [place]   Lot: [id number]   [place]: [address]-7

## 2019-06-07 DIAGNOSIS — Z4689 Encounter for fitting and adjustment of other specified devices: Secondary | ICD-10-CM | POA: Diagnosis not present

## 2019-06-29 ENCOUNTER — Ambulatory Visit: Payer: PPO | Attending: Internal Medicine

## 2019-06-29 DIAGNOSIS — Z23 Encounter for immunization: Secondary | ICD-10-CM

## 2019-06-29 NOTE — Progress Notes (Signed)
   Covid-19 Vaccination Clinic  Name:  Michelle Rubio    MRN: [id number] DOB: 11-09-1950  06/29/2019  [name] was observed post Covid-19 immunization for 15 minutes without incident. She was provided with Vaccine Information Sheet and instruction to access the V-Safe system.   [name] was instructed to call 911 with any severe reactions post vaccine: Marland Kitchen Difficulty breathing  . Swelling of face and throat  . A fast heartbeat  . A bad rash all over body  . Dizziness and weakness   Immunizations Administered    Name Date Dose VIS Date Route   Pfizer COVID-19 Vaccine 06/29/2019  1:22 PM 0.3 mL 03/12/2019 Intramuscular   Manufacturer: [place]   Lot: [id number]   [place]: [address]-0

## 2019-07-07 DIAGNOSIS — Z940 Kidney transplant status: Secondary | ICD-10-CM | POA: Diagnosis not present

## 2019-07-16 DIAGNOSIS — E785 Hyperlipidemia, unspecified: Secondary | ICD-10-CM | POA: Diagnosis not present

## 2019-07-16 DIAGNOSIS — E1169 Type 2 diabetes mellitus with other specified complication: Secondary | ICD-10-CM | POA: Diagnosis not present

## 2019-07-16 DIAGNOSIS — E038 Other specified hypothyroidism: Secondary | ICD-10-CM | POA: Diagnosis not present

## 2019-07-16 DIAGNOSIS — I10 Essential (primary) hypertension: Secondary | ICD-10-CM | POA: Diagnosis not present

## 2019-07-16 DIAGNOSIS — R799 Abnormal finding of blood chemistry, unspecified: Secondary | ICD-10-CM | POA: Diagnosis not present

## 2019-07-16 DIAGNOSIS — R7309 Other abnormal glucose: Secondary | ICD-10-CM | POA: Diagnosis not present

## 2019-07-19 DIAGNOSIS — E785 Hyperlipidemia, unspecified: Secondary | ICD-10-CM | POA: Diagnosis not present

## 2019-07-19 DIAGNOSIS — E038 Other specified hypothyroidism: Secondary | ICD-10-CM | POA: Diagnosis not present

## 2019-07-19 DIAGNOSIS — F064 Anxiety disorder due to known physiological condition: Secondary | ICD-10-CM | POA: Diagnosis not present

## 2019-07-19 DIAGNOSIS — I10 Essential (primary) hypertension: Secondary | ICD-10-CM | POA: Diagnosis not present

## 2019-08-05 DIAGNOSIS — Z940 Kidney transplant status: Secondary | ICD-10-CM | POA: Diagnosis not present

## 2019-09-27 DIAGNOSIS — H401113 Primary open-angle glaucoma, right eye, severe stage: Secondary | ICD-10-CM | POA: Diagnosis not present

## 2019-09-27 DIAGNOSIS — H401122 Primary open-angle glaucoma, left eye, moderate stage: Secondary | ICD-10-CM | POA: Diagnosis not present

## 2019-10-11 DIAGNOSIS — Z940 Kidney transplant status: Secondary | ICD-10-CM | POA: Diagnosis not present

## 2019-10-15 DIAGNOSIS — L658 Other specified nonscarring hair loss: Secondary | ICD-10-CM | POA: Diagnosis not present

## 2019-10-15 DIAGNOSIS — D225 Melanocytic nevi of trunk: Secondary | ICD-10-CM | POA: Diagnosis not present

## 2019-10-15 DIAGNOSIS — L821 Other seborrheic keratosis: Secondary | ICD-10-CM | POA: Diagnosis not present

## 2019-10-19 DIAGNOSIS — Z01419 Encounter for gynecological examination (general) (routine) without abnormal findings: Secondary | ICD-10-CM | POA: Diagnosis not present

## 2019-10-19 DIAGNOSIS — Z1231 Encounter for screening mammogram for malignant neoplasm of breast: Secondary | ICD-10-CM | POA: Diagnosis not present

## 2019-10-26 ENCOUNTER — Other Ambulatory Visit: Payer: Self-pay

## 2019-11-12 DIAGNOSIS — D72819 Decreased white blood cell count, unspecified: Secondary | ICD-10-CM | POA: Diagnosis not present

## 2019-11-12 DIAGNOSIS — B349 Viral infection, unspecified: Secondary | ICD-10-CM | POA: Diagnosis not present

## 2019-11-12 DIAGNOSIS — Z940 Kidney transplant status: Secondary | ICD-10-CM | POA: Diagnosis not present

## 2019-11-12 DIAGNOSIS — Z792 Long term (current) use of antibiotics: Secondary | ICD-10-CM | POA: Diagnosis not present

## 2019-11-12 DIAGNOSIS — Z79899 Other long term (current) drug therapy: Secondary | ICD-10-CM | POA: Diagnosis not present

## 2019-11-12 DIAGNOSIS — I10 Essential (primary) hypertension: Secondary | ICD-10-CM | POA: Diagnosis not present

## 2019-11-17 DIAGNOSIS — F064 Anxiety disorder due to known physiological condition: Secondary | ICD-10-CM | POA: Diagnosis not present

## 2019-11-17 DIAGNOSIS — E785 Hyperlipidemia, unspecified: Secondary | ICD-10-CM | POA: Diagnosis not present

## 2019-11-17 DIAGNOSIS — I10 Essential (primary) hypertension: Secondary | ICD-10-CM | POA: Diagnosis not present

## 2019-11-17 DIAGNOSIS — E038 Other specified hypothyroidism: Secondary | ICD-10-CM | POA: Diagnosis not present

## 2019-11-18 DIAGNOSIS — I10 Essential (primary) hypertension: Secondary | ICD-10-CM | POA: Diagnosis not present

## 2019-11-18 DIAGNOSIS — E038 Other specified hypothyroidism: Secondary | ICD-10-CM | POA: Diagnosis not present

## 2019-11-18 DIAGNOSIS — F064 Anxiety disorder due to known physiological condition: Secondary | ICD-10-CM | POA: Diagnosis not present

## 2019-11-18 DIAGNOSIS — Z72820 Sleep deprivation: Secondary | ICD-10-CM | POA: Diagnosis not present

## 2019-12-09 DIAGNOSIS — I10 Essential (primary) hypertension: Secondary | ICD-10-CM | POA: Diagnosis not present

## 2019-12-09 DIAGNOSIS — E1169 Type 2 diabetes mellitus with other specified complication: Secondary | ICD-10-CM | POA: Diagnosis not present

## 2019-12-13 DIAGNOSIS — H401122 Primary open-angle glaucoma, left eye, moderate stage: Secondary | ICD-10-CM | POA: Diagnosis not present

## 2019-12-13 DIAGNOSIS — H401113 Primary open-angle glaucoma, right eye, severe stage: Secondary | ICD-10-CM | POA: Diagnosis not present

## 2020-01-29 DIAGNOSIS — I129 Hypertensive chronic kidney disease with stage 1 through stage 4 chronic kidney disease, or unspecified chronic kidney disease: Secondary | ICD-10-CM | POA: Diagnosis not present

## 2020-01-29 DIAGNOSIS — E785 Hyperlipidemia, unspecified: Secondary | ICD-10-CM | POA: Diagnosis not present

## 2020-01-29 DIAGNOSIS — N1832 Chronic kidney disease, stage 3b: Secondary | ICD-10-CM | POA: Diagnosis not present

## 2020-01-29 DIAGNOSIS — E1122 Type 2 diabetes mellitus with diabetic chronic kidney disease: Secondary | ICD-10-CM | POA: Diagnosis not present

## 2020-02-17 DIAGNOSIS — I10 Essential (primary) hypertension: Secondary | ICD-10-CM | POA: Diagnosis not present

## 2020-02-17 DIAGNOSIS — E785 Hyperlipidemia, unspecified: Secondary | ICD-10-CM | POA: Diagnosis not present

## 2020-02-17 DIAGNOSIS — E1169 Type 2 diabetes mellitus with other specified complication: Secondary | ICD-10-CM | POA: Diagnosis not present

## 2020-02-17 DIAGNOSIS — F064 Anxiety disorder due to known physiological condition: Secondary | ICD-10-CM | POA: Diagnosis not present

## 2020-02-18 DIAGNOSIS — E1169 Type 2 diabetes mellitus with other specified complication: Secondary | ICD-10-CM | POA: Diagnosis not present

## 2020-02-18 DIAGNOSIS — I129 Hypertensive chronic kidney disease with stage 1 through stage 4 chronic kidney disease, or unspecified chronic kidney disease: Secondary | ICD-10-CM | POA: Diagnosis not present

## 2020-02-18 DIAGNOSIS — I10 Essential (primary) hypertension: Secondary | ICD-10-CM | POA: Diagnosis not present

## 2020-02-18 DIAGNOSIS — E785 Hyperlipidemia, unspecified: Secondary | ICD-10-CM | POA: Diagnosis not present

## 2020-03-31 DIAGNOSIS — N1832 Chronic kidney disease, stage 3b: Secondary | ICD-10-CM | POA: Diagnosis not present

## 2020-03-31 DIAGNOSIS — E785 Hyperlipidemia, unspecified: Secondary | ICD-10-CM | POA: Diagnosis not present

## 2020-03-31 DIAGNOSIS — I129 Hypertensive chronic kidney disease with stage 1 through stage 4 chronic kidney disease, or unspecified chronic kidney disease: Secondary | ICD-10-CM | POA: Diagnosis not present

## 2020-03-31 DIAGNOSIS — E1122 Type 2 diabetes mellitus with diabetic chronic kidney disease: Secondary | ICD-10-CM | POA: Diagnosis not present

## 2020-04-03 DIAGNOSIS — E1169 Type 2 diabetes mellitus with other specified complication: Secondary | ICD-10-CM | POA: Diagnosis not present

## 2020-04-03 DIAGNOSIS — Z4689 Encounter for fitting and adjustment of other specified devices: Secondary | ICD-10-CM | POA: Diagnosis not present

## 2020-04-03 DIAGNOSIS — I10 Essential (primary) hypertension: Secondary | ICD-10-CM | POA: Diagnosis not present

## 2020-04-03 DIAGNOSIS — I129 Hypertensive chronic kidney disease with stage 1 through stage 4 chronic kidney disease, or unspecified chronic kidney disease: Secondary | ICD-10-CM | POA: Diagnosis not present

## 2020-04-03 DIAGNOSIS — E785 Hyperlipidemia, unspecified: Secondary | ICD-10-CM | POA: Diagnosis not present

## 2020-04-04 DIAGNOSIS — Z0000 Encounter for general adult medical examination without abnormal findings: Secondary | ICD-10-CM | POA: Diagnosis not present

## 2020-04-04 DIAGNOSIS — I10 Essential (primary) hypertension: Secondary | ICD-10-CM | POA: Diagnosis not present

## 2020-04-04 DIAGNOSIS — E038 Other specified hypothyroidism: Secondary | ICD-10-CM | POA: Diagnosis not present

## 2020-04-04 DIAGNOSIS — I129 Hypertensive chronic kidney disease with stage 1 through stage 4 chronic kidney disease, or unspecified chronic kidney disease: Secondary | ICD-10-CM | POA: Diagnosis not present

## 2020-04-04 DIAGNOSIS — N1832 Chronic kidney disease, stage 3b: Secondary | ICD-10-CM | POA: Diagnosis not present

## 2020-04-04 DIAGNOSIS — E785 Hyperlipidemia, unspecified: Secondary | ICD-10-CM | POA: Diagnosis not present

## 2020-04-07 DIAGNOSIS — H401113 Primary open-angle glaucoma, right eye, severe stage: Secondary | ICD-10-CM | POA: Diagnosis not present

## 2020-04-07 DIAGNOSIS — H401122 Primary open-angle glaucoma, left eye, moderate stage: Secondary | ICD-10-CM | POA: Diagnosis not present

## 2020-04-24 DIAGNOSIS — Z79899 Other long term (current) drug therapy: Secondary | ICD-10-CM | POA: Diagnosis not present

## 2020-04-24 DIAGNOSIS — D72819 Decreased white blood cell count, unspecified: Secondary | ICD-10-CM | POA: Diagnosis not present

## 2020-04-24 DIAGNOSIS — Z940 Kidney transplant status: Secondary | ICD-10-CM | POA: Diagnosis not present

## 2020-04-24 DIAGNOSIS — Z4822 Encounter for aftercare following kidney transplant: Secondary | ICD-10-CM | POA: Diagnosis not present

## 2020-04-24 DIAGNOSIS — I10 Essential (primary) hypertension: Secondary | ICD-10-CM | POA: Diagnosis not present

## 2020-04-25 DIAGNOSIS — Z940 Kidney transplant status: Secondary | ICD-10-CM | POA: Diagnosis not present

## 2020-04-29 DIAGNOSIS — N1832 Chronic kidney disease, stage 3b: Secondary | ICD-10-CM | POA: Diagnosis not present

## 2020-04-29 DIAGNOSIS — I129 Hypertensive chronic kidney disease with stage 1 through stage 4 chronic kidney disease, or unspecified chronic kidney disease: Secondary | ICD-10-CM | POA: Diagnosis not present

## 2020-04-29 DIAGNOSIS — E1122 Type 2 diabetes mellitus with diabetic chronic kidney disease: Secondary | ICD-10-CM | POA: Diagnosis not present

## 2020-04-29 DIAGNOSIS — E785 Hyperlipidemia, unspecified: Secondary | ICD-10-CM | POA: Diagnosis not present

## 2020-05-02 DIAGNOSIS — Z4822 Encounter for aftercare following kidney transplant: Secondary | ICD-10-CM | POA: Diagnosis not present

## 2020-05-02 DIAGNOSIS — N281 Cyst of kidney, acquired: Secondary | ICD-10-CM | POA: Diagnosis not present

## 2020-05-11 DIAGNOSIS — Z940 Kidney transplant status: Secondary | ICD-10-CM | POA: Diagnosis not present

## 2020-06-29 DIAGNOSIS — E1122 Type 2 diabetes mellitus with diabetic chronic kidney disease: Secondary | ICD-10-CM | POA: Diagnosis not present

## 2020-06-29 DIAGNOSIS — N1832 Chronic kidney disease, stage 3b: Secondary | ICD-10-CM | POA: Diagnosis not present

## 2020-06-29 DIAGNOSIS — I129 Hypertensive chronic kidney disease with stage 1 through stage 4 chronic kidney disease, or unspecified chronic kidney disease: Secondary | ICD-10-CM | POA: Diagnosis not present

## 2020-06-30 DIAGNOSIS — E785 Hyperlipidemia, unspecified: Secondary | ICD-10-CM | POA: Diagnosis not present

## 2020-06-30 DIAGNOSIS — I129 Hypertensive chronic kidney disease with stage 1 through stage 4 chronic kidney disease, or unspecified chronic kidney disease: Secondary | ICD-10-CM | POA: Diagnosis not present

## 2020-06-30 DIAGNOSIS — E039 Hypothyroidism, unspecified: Secondary | ICD-10-CM | POA: Diagnosis not present

## 2020-06-30 DIAGNOSIS — E1122 Type 2 diabetes mellitus with diabetic chronic kidney disease: Secondary | ICD-10-CM | POA: Diagnosis not present

## 2020-06-30 DIAGNOSIS — I10 Essential (primary) hypertension: Secondary | ICD-10-CM | POA: Diagnosis not present

## 2020-07-03 DIAGNOSIS — I129 Hypertensive chronic kidney disease with stage 1 through stage 4 chronic kidney disease, or unspecified chronic kidney disease: Secondary | ICD-10-CM | POA: Diagnosis not present

## 2020-07-03 DIAGNOSIS — E1122 Type 2 diabetes mellitus with diabetic chronic kidney disease: Secondary | ICD-10-CM | POA: Diagnosis not present

## 2020-07-03 DIAGNOSIS — Z940 Kidney transplant status: Secondary | ICD-10-CM | POA: Diagnosis not present

## 2020-07-03 DIAGNOSIS — L630 Alopecia (capitis) totalis: Secondary | ICD-10-CM | POA: Diagnosis not present

## 2020-07-03 DIAGNOSIS — N1832 Chronic kidney disease, stage 3b: Secondary | ICD-10-CM | POA: Diagnosis not present

## 2020-07-29 DIAGNOSIS — I129 Hypertensive chronic kidney disease with stage 1 through stage 4 chronic kidney disease, or unspecified chronic kidney disease: Secondary | ICD-10-CM | POA: Diagnosis not present

## 2020-07-29 DIAGNOSIS — E1122 Type 2 diabetes mellitus with diabetic chronic kidney disease: Secondary | ICD-10-CM | POA: Diagnosis not present

## 2020-07-29 DIAGNOSIS — N1832 Chronic kidney disease, stage 3b: Secondary | ICD-10-CM | POA: Diagnosis not present

## 2020-07-29 DIAGNOSIS — E785 Hyperlipidemia, unspecified: Secondary | ICD-10-CM | POA: Diagnosis not present

## 2020-08-02 DIAGNOSIS — Z940 Kidney transplant status: Secondary | ICD-10-CM | POA: Diagnosis not present

## 2020-08-08 DIAGNOSIS — H401122 Primary open-angle glaucoma, left eye, moderate stage: Secondary | ICD-10-CM | POA: Diagnosis not present

## 2020-08-08 DIAGNOSIS — H401113 Primary open-angle glaucoma, right eye, severe stage: Secondary | ICD-10-CM | POA: Diagnosis not present

## 2020-08-25 DIAGNOSIS — Z940 Kidney transplant status: Secondary | ICD-10-CM | POA: Diagnosis not present

## 2020-08-25 DIAGNOSIS — Z4822 Encounter for aftercare following kidney transplant: Secondary | ICD-10-CM | POA: Diagnosis not present

## 2020-08-29 DIAGNOSIS — I129 Hypertensive chronic kidney disease with stage 1 through stage 4 chronic kidney disease, or unspecified chronic kidney disease: Secondary | ICD-10-CM | POA: Diagnosis not present

## 2020-08-29 DIAGNOSIS — E1122 Type 2 diabetes mellitus with diabetic chronic kidney disease: Secondary | ICD-10-CM | POA: Diagnosis not present

## 2020-08-29 DIAGNOSIS — N1832 Chronic kidney disease, stage 3b: Secondary | ICD-10-CM | POA: Diagnosis not present

## 2020-08-29 DIAGNOSIS — E785 Hyperlipidemia, unspecified: Secondary | ICD-10-CM | POA: Diagnosis not present

## 2020-09-22 DIAGNOSIS — E1122 Type 2 diabetes mellitus with diabetic chronic kidney disease: Secondary | ICD-10-CM | POA: Diagnosis not present

## 2020-09-22 DIAGNOSIS — I129 Hypertensive chronic kidney disease with stage 1 through stage 4 chronic kidney disease, or unspecified chronic kidney disease: Secondary | ICD-10-CM | POA: Diagnosis not present

## 2020-09-22 DIAGNOSIS — E785 Hyperlipidemia, unspecified: Secondary | ICD-10-CM | POA: Diagnosis not present

## 2020-09-22 DIAGNOSIS — N1832 Chronic kidney disease, stage 3b: Secondary | ICD-10-CM | POA: Diagnosis not present

## 2020-09-25 DIAGNOSIS — I10 Essential (primary) hypertension: Secondary | ICD-10-CM | POA: Diagnosis not present

## 2020-09-25 DIAGNOSIS — T8610 Unspecified complication of kidney transplant: Secondary | ICD-10-CM | POA: Diagnosis not present

## 2020-09-25 DIAGNOSIS — M130 Polyarthritis, unspecified: Secondary | ICD-10-CM | POA: Diagnosis not present

## 2020-09-25 DIAGNOSIS — E785 Hyperlipidemia, unspecified: Secondary | ICD-10-CM | POA: Diagnosis not present

## 2020-09-25 DIAGNOSIS — E1169 Type 2 diabetes mellitus with other specified complication: Secondary | ICD-10-CM | POA: Diagnosis not present

## 2020-09-25 DIAGNOSIS — E1122 Type 2 diabetes mellitus with diabetic chronic kidney disease: Secondary | ICD-10-CM | POA: Diagnosis not present

## 2020-09-26 DIAGNOSIS — R2241 Localized swelling, mass and lump, right lower limb: Secondary | ICD-10-CM | POA: Diagnosis not present

## 2020-09-26 DIAGNOSIS — R2240 Localized swelling, mass and lump, unspecified lower limb: Secondary | ICD-10-CM | POA: Diagnosis not present

## 2020-09-26 DIAGNOSIS — M25561 Pain in right knee: Secondary | ICD-10-CM | POA: Diagnosis not present

## 2020-09-28 DIAGNOSIS — N1832 Chronic kidney disease, stage 3b: Secondary | ICD-10-CM | POA: Diagnosis not present

## 2020-09-28 DIAGNOSIS — E785 Hyperlipidemia, unspecified: Secondary | ICD-10-CM | POA: Diagnosis not present

## 2020-09-28 DIAGNOSIS — E1122 Type 2 diabetes mellitus with diabetic chronic kidney disease: Secondary | ICD-10-CM | POA: Diagnosis not present

## 2020-09-28 DIAGNOSIS — I129 Hypertensive chronic kidney disease with stage 1 through stage 4 chronic kidney disease, or unspecified chronic kidney disease: Secondary | ICD-10-CM | POA: Diagnosis not present

## 2020-10-10 DIAGNOSIS — R2240 Localized swelling, mass and lump, unspecified lower limb: Secondary | ICD-10-CM | POA: Diagnosis not present

## 2020-10-10 DIAGNOSIS — I10 Essential (primary) hypertension: Secondary | ICD-10-CM | POA: Diagnosis not present

## 2020-10-10 DIAGNOSIS — E1121 Type 2 diabetes mellitus with diabetic nephropathy: Secondary | ICD-10-CM | POA: Diagnosis not present

## 2020-10-10 DIAGNOSIS — N186 End stage renal disease: Secondary | ICD-10-CM | POA: Diagnosis not present

## 2020-10-29 DIAGNOSIS — I129 Hypertensive chronic kidney disease with stage 1 through stage 4 chronic kidney disease, or unspecified chronic kidney disease: Secondary | ICD-10-CM | POA: Diagnosis not present

## 2020-10-29 DIAGNOSIS — N1832 Chronic kidney disease, stage 3b: Secondary | ICD-10-CM | POA: Diagnosis not present

## 2020-10-29 DIAGNOSIS — E785 Hyperlipidemia, unspecified: Secondary | ICD-10-CM | POA: Diagnosis not present

## 2020-10-29 DIAGNOSIS — E1122 Type 2 diabetes mellitus with diabetic chronic kidney disease: Secondary | ICD-10-CM | POA: Diagnosis not present

## 2020-11-08 DIAGNOSIS — K5909 Other constipation: Secondary | ICD-10-CM | POA: Diagnosis not present

## 2020-11-08 DIAGNOSIS — N814 Uterovaginal prolapse, unspecified: Secondary | ICD-10-CM | POA: Diagnosis not present

## 2020-11-08 DIAGNOSIS — E119 Type 2 diabetes mellitus without complications: Secondary | ICD-10-CM | POA: Diagnosis not present

## 2020-11-08 DIAGNOSIS — Z79899 Other long term (current) drug therapy: Secondary | ICD-10-CM | POA: Diagnosis not present

## 2020-11-08 DIAGNOSIS — Z4822 Encounter for aftercare following kidney transplant: Secondary | ICD-10-CM | POA: Diagnosis not present

## 2020-11-08 DIAGNOSIS — Z7952 Long term (current) use of systemic steroids: Secondary | ICD-10-CM | POA: Diagnosis not present

## 2020-11-08 DIAGNOSIS — Z940 Kidney transplant status: Secondary | ICD-10-CM | POA: Diagnosis not present

## 2020-11-08 DIAGNOSIS — D8989 Other specified disorders involving the immune mechanism, not elsewhere classified: Secondary | ICD-10-CM | POA: Diagnosis not present

## 2020-11-08 DIAGNOSIS — Z792 Long term (current) use of antibiotics: Secondary | ICD-10-CM | POA: Diagnosis not present

## 2020-11-08 DIAGNOSIS — I10 Essential (primary) hypertension: Secondary | ICD-10-CM | POA: Diagnosis not present

## 2020-11-08 DIAGNOSIS — D72819 Decreased white blood cell count, unspecified: Secondary | ICD-10-CM | POA: Diagnosis not present

## 2020-11-08 DIAGNOSIS — Z8616 Personal history of COVID-19: Secondary | ICD-10-CM | POA: Diagnosis not present

## 2020-11-22 DIAGNOSIS — Z1231 Encounter for screening mammogram for malignant neoplasm of breast: Secondary | ICD-10-CM | POA: Diagnosis not present

## 2020-11-22 DIAGNOSIS — Z4689 Encounter for fitting and adjustment of other specified devices: Secondary | ICD-10-CM | POA: Diagnosis not present

## 2020-11-29 DIAGNOSIS — E1122 Type 2 diabetes mellitus with diabetic chronic kidney disease: Secondary | ICD-10-CM | POA: Diagnosis not present

## 2020-11-29 DIAGNOSIS — N1832 Chronic kidney disease, stage 3b: Secondary | ICD-10-CM | POA: Diagnosis not present

## 2020-11-29 DIAGNOSIS — E785 Hyperlipidemia, unspecified: Secondary | ICD-10-CM | POA: Diagnosis not present

## 2020-11-29 DIAGNOSIS — I129 Hypertensive chronic kidney disease with stage 1 through stage 4 chronic kidney disease, or unspecified chronic kidney disease: Secondary | ICD-10-CM | POA: Diagnosis not present

## 2020-12-01 DIAGNOSIS — E1169 Type 2 diabetes mellitus with other specified complication: Secondary | ICD-10-CM | POA: Diagnosis not present

## 2020-12-01 DIAGNOSIS — E039 Hypothyroidism, unspecified: Secondary | ICD-10-CM | POA: Diagnosis not present

## 2020-12-01 DIAGNOSIS — I10 Essential (primary) hypertension: Secondary | ICD-10-CM | POA: Diagnosis not present

## 2020-12-01 DIAGNOSIS — E785 Hyperlipidemia, unspecified: Secondary | ICD-10-CM | POA: Diagnosis not present

## 2020-12-05 DIAGNOSIS — T8610 Unspecified complication of kidney transplant: Secondary | ICD-10-CM | POA: Diagnosis not present

## 2020-12-05 DIAGNOSIS — E1122 Type 2 diabetes mellitus with diabetic chronic kidney disease: Secondary | ICD-10-CM | POA: Diagnosis not present

## 2020-12-05 DIAGNOSIS — F064 Anxiety disorder due to known physiological condition: Secondary | ICD-10-CM | POA: Diagnosis not present

## 2020-12-05 DIAGNOSIS — I9589 Other hypotension: Secondary | ICD-10-CM | POA: Diagnosis not present

## 2020-12-05 DIAGNOSIS — I10 Essential (primary) hypertension: Secondary | ICD-10-CM | POA: Diagnosis not present

## 2020-12-19 DIAGNOSIS — H401113 Primary open-angle glaucoma, right eye, severe stage: Secondary | ICD-10-CM | POA: Diagnosis not present

## 2020-12-19 DIAGNOSIS — H401122 Primary open-angle glaucoma, left eye, moderate stage: Secondary | ICD-10-CM | POA: Diagnosis not present

## 2020-12-28 DIAGNOSIS — L659 Nonscarring hair loss, unspecified: Secondary | ICD-10-CM | POA: Diagnosis not present

## 2020-12-28 DIAGNOSIS — L821 Other seborrheic keratosis: Secondary | ICD-10-CM | POA: Diagnosis not present

## 2020-12-28 DIAGNOSIS — D225 Melanocytic nevi of trunk: Secondary | ICD-10-CM | POA: Diagnosis not present

## 2020-12-28 DIAGNOSIS — L814 Other melanin hyperpigmentation: Secondary | ICD-10-CM | POA: Diagnosis not present

## 2020-12-29 DIAGNOSIS — E785 Hyperlipidemia, unspecified: Secondary | ICD-10-CM | POA: Diagnosis not present

## 2020-12-29 DIAGNOSIS — E1122 Type 2 diabetes mellitus with diabetic chronic kidney disease: Secondary | ICD-10-CM | POA: Diagnosis not present

## 2020-12-29 DIAGNOSIS — N1832 Chronic kidney disease, stage 3b: Secondary | ICD-10-CM | POA: Diagnosis not present

## 2020-12-29 DIAGNOSIS — I129 Hypertensive chronic kidney disease with stage 1 through stage 4 chronic kidney disease, or unspecified chronic kidney disease: Secondary | ICD-10-CM | POA: Diagnosis not present

## 2021-01-16 DIAGNOSIS — I10 Essential (primary) hypertension: Secondary | ICD-10-CM | POA: Diagnosis not present

## 2021-01-16 DIAGNOSIS — M130 Polyarthritis, unspecified: Secondary | ICD-10-CM | POA: Diagnosis not present

## 2021-01-16 DIAGNOSIS — E038 Other specified hypothyroidism: Secondary | ICD-10-CM | POA: Diagnosis not present

## 2021-01-16 DIAGNOSIS — I129 Hypertensive chronic kidney disease with stage 1 through stage 4 chronic kidney disease, or unspecified chronic kidney disease: Secondary | ICD-10-CM | POA: Diagnosis not present

## 2021-01-16 DIAGNOSIS — E1169 Type 2 diabetes mellitus with other specified complication: Secondary | ICD-10-CM | POA: Diagnosis not present

## 2021-01-16 DIAGNOSIS — N1832 Chronic kidney disease, stage 3b: Secondary | ICD-10-CM | POA: Diagnosis not present

## 2021-01-29 DIAGNOSIS — E785 Hyperlipidemia, unspecified: Secondary | ICD-10-CM | POA: Diagnosis not present

## 2021-01-29 DIAGNOSIS — N1832 Chronic kidney disease, stage 3b: Secondary | ICD-10-CM | POA: Diagnosis not present

## 2021-01-29 DIAGNOSIS — I129 Hypertensive chronic kidney disease with stage 1 through stage 4 chronic kidney disease, or unspecified chronic kidney disease: Secondary | ICD-10-CM | POA: Diagnosis not present

## 2021-01-29 DIAGNOSIS — E1122 Type 2 diabetes mellitus with diabetic chronic kidney disease: Secondary | ICD-10-CM | POA: Diagnosis not present

## 2021-02-02 DIAGNOSIS — D84821 Immunodeficiency due to drugs: Secondary | ICD-10-CM | POA: Diagnosis not present

## 2021-02-02 DIAGNOSIS — Z940 Kidney transplant status: Secondary | ICD-10-CM | POA: Diagnosis not present

## 2021-02-02 DIAGNOSIS — I10 Essential (primary) hypertension: Secondary | ICD-10-CM | POA: Diagnosis not present

## 2021-02-02 DIAGNOSIS — N183 Chronic kidney disease, stage 3 unspecified: Secondary | ICD-10-CM | POA: Diagnosis not present

## 2021-03-30 DIAGNOSIS — N1832 Chronic kidney disease, stage 3b: Secondary | ICD-10-CM | POA: Diagnosis not present

## 2021-03-30 DIAGNOSIS — I129 Hypertensive chronic kidney disease with stage 1 through stage 4 chronic kidney disease, or unspecified chronic kidney disease: Secondary | ICD-10-CM | POA: Diagnosis not present

## 2021-03-30 DIAGNOSIS — E1122 Type 2 diabetes mellitus with diabetic chronic kidney disease: Secondary | ICD-10-CM | POA: Diagnosis not present

## 2021-03-30 DIAGNOSIS — E785 Hyperlipidemia, unspecified: Secondary | ICD-10-CM | POA: Diagnosis not present

## 2021-04-13 DIAGNOSIS — E1122 Type 2 diabetes mellitus with diabetic chronic kidney disease: Secondary | ICD-10-CM | POA: Diagnosis not present

## 2021-04-13 DIAGNOSIS — N1832 Chronic kidney disease, stage 3b: Secondary | ICD-10-CM | POA: Diagnosis not present

## 2021-04-13 DIAGNOSIS — E785 Hyperlipidemia, unspecified: Secondary | ICD-10-CM | POA: Diagnosis not present

## 2021-04-13 DIAGNOSIS — E1169 Type 2 diabetes mellitus with other specified complication: Secondary | ICD-10-CM | POA: Diagnosis not present

## 2021-04-13 DIAGNOSIS — I129 Hypertensive chronic kidney disease with stage 1 through stage 4 chronic kidney disease, or unspecified chronic kidney disease: Secondary | ICD-10-CM | POA: Diagnosis not present

## 2021-04-13 DIAGNOSIS — E039 Hypothyroidism, unspecified: Secondary | ICD-10-CM | POA: Diagnosis not present

## 2021-04-17 DIAGNOSIS — I129 Hypertensive chronic kidney disease with stage 1 through stage 4 chronic kidney disease, or unspecified chronic kidney disease: Secondary | ICD-10-CM | POA: Diagnosis not present

## 2021-04-17 DIAGNOSIS — E038 Other specified hypothyroidism: Secondary | ICD-10-CM | POA: Diagnosis not present

## 2021-04-17 DIAGNOSIS — N1832 Chronic kidney disease, stage 3b: Secondary | ICD-10-CM | POA: Diagnosis not present

## 2021-04-17 DIAGNOSIS — Z940 Kidney transplant status: Secondary | ICD-10-CM | POA: Diagnosis not present

## 2021-04-19 DIAGNOSIS — H401122 Primary open-angle glaucoma, left eye, moderate stage: Secondary | ICD-10-CM | POA: Diagnosis not present

## 2021-04-19 DIAGNOSIS — H401113 Primary open-angle glaucoma, right eye, severe stage: Secondary | ICD-10-CM | POA: Diagnosis not present

## 2021-04-30 DIAGNOSIS — R944 Abnormal results of kidney function studies: Secondary | ICD-10-CM | POA: Diagnosis not present

## 2021-05-01 DIAGNOSIS — N1832 Chronic kidney disease, stage 3b: Secondary | ICD-10-CM | POA: Diagnosis not present

## 2021-05-01 DIAGNOSIS — E038 Other specified hypothyroidism: Secondary | ICD-10-CM | POA: Diagnosis not present

## 2021-05-01 DIAGNOSIS — E1122 Type 2 diabetes mellitus with diabetic chronic kidney disease: Secondary | ICD-10-CM | POA: Diagnosis not present

## 2021-05-01 DIAGNOSIS — Z940 Kidney transplant status: Secondary | ICD-10-CM | POA: Diagnosis not present

## 2021-05-01 DIAGNOSIS — I10 Essential (primary) hypertension: Secondary | ICD-10-CM | POA: Diagnosis not present

## 2021-07-24 DIAGNOSIS — Z7984 Long term (current) use of oral hypoglycemic drugs: Secondary | ICD-10-CM | POA: Diagnosis not present

## 2021-07-24 DIAGNOSIS — N185 Chronic kidney disease, stage 5: Secondary | ICD-10-CM | POA: Diagnosis not present

## 2021-07-24 DIAGNOSIS — K5900 Constipation, unspecified: Secondary | ICD-10-CM | POA: Diagnosis not present

## 2021-07-24 DIAGNOSIS — E1122 Type 2 diabetes mellitus with diabetic chronic kidney disease: Secondary | ICD-10-CM | POA: Diagnosis not present

## 2021-07-30 DIAGNOSIS — Z940 Kidney transplant status: Secondary | ICD-10-CM | POA: Diagnosis not present

## 2021-08-15 DIAGNOSIS — Z7969 Long term (current) use of other immunomodulators and immunosuppressants: Secondary | ICD-10-CM | POA: Diagnosis not present

## 2021-08-15 DIAGNOSIS — E1122 Type 2 diabetes mellitus with diabetic chronic kidney disease: Secondary | ICD-10-CM | POA: Diagnosis not present

## 2021-08-15 DIAGNOSIS — Z79899 Other long term (current) drug therapy: Secondary | ICD-10-CM | POA: Diagnosis not present

## 2021-08-15 DIAGNOSIS — Z4822 Encounter for aftercare following kidney transplant: Secondary | ICD-10-CM | POA: Diagnosis not present

## 2021-08-15 DIAGNOSIS — Z79621 Long term (current) use of calcineurin inhibitor: Secondary | ICD-10-CM | POA: Diagnosis not present

## 2021-08-15 DIAGNOSIS — Z7982 Long term (current) use of aspirin: Secondary | ICD-10-CM | POA: Diagnosis not present

## 2021-08-15 DIAGNOSIS — Z940 Kidney transplant status: Secondary | ICD-10-CM | POA: Diagnosis not present

## 2021-08-15 DIAGNOSIS — N183 Chronic kidney disease, stage 3 unspecified: Secondary | ICD-10-CM | POA: Diagnosis not present

## 2021-08-15 DIAGNOSIS — N1832 Chronic kidney disease, stage 3b: Secondary | ICD-10-CM | POA: Diagnosis not present

## 2021-08-15 DIAGNOSIS — E038 Other specified hypothyroidism: Secondary | ICD-10-CM | POA: Diagnosis not present

## 2021-08-15 DIAGNOSIS — I10 Essential (primary) hypertension: Secondary | ICD-10-CM | POA: Diagnosis not present

## 2021-08-16 DIAGNOSIS — Z0000 Encounter for general adult medical examination without abnormal findings: Secondary | ICD-10-CM | POA: Diagnosis not present

## 2021-08-16 DIAGNOSIS — E1122 Type 2 diabetes mellitus with diabetic chronic kidney disease: Secondary | ICD-10-CM | POA: Diagnosis not present

## 2021-08-16 DIAGNOSIS — I129 Hypertensive chronic kidney disease with stage 1 through stage 4 chronic kidney disease, or unspecified chronic kidney disease: Secondary | ICD-10-CM | POA: Diagnosis not present

## 2021-08-16 DIAGNOSIS — E1169 Type 2 diabetes mellitus with other specified complication: Secondary | ICD-10-CM | POA: Diagnosis not present

## 2021-08-16 DIAGNOSIS — E038 Other specified hypothyroidism: Secondary | ICD-10-CM | POA: Diagnosis not present

## 2021-08-17 DIAGNOSIS — H401113 Primary open-angle glaucoma, right eye, severe stage: Secondary | ICD-10-CM | POA: Diagnosis not present

## 2021-08-17 DIAGNOSIS — H401122 Primary open-angle glaucoma, left eye, moderate stage: Secondary | ICD-10-CM | POA: Diagnosis not present

## 2021-10-26 ENCOUNTER — Other Ambulatory Visit: Payer: Self-pay

## 2021-10-26 ENCOUNTER — Emergency Department (HOSPITAL_BASED_OUTPATIENT_CLINIC_OR_DEPARTMENT_OTHER): Payer: PPO

## 2021-10-26 ENCOUNTER — Emergency Department (HOSPITAL_BASED_OUTPATIENT_CLINIC_OR_DEPARTMENT_OTHER)
Admission: EM | Admit: 2021-10-26 | Discharge: 2021-10-26 | Disposition: A | Discharge status: Home / Self Care | Payer: PPO | Attending: Emergency Medicine | Admitting: Emergency Medicine

## 2021-10-26 ENCOUNTER — Encounter (HOSPITAL_BASED_OUTPATIENT_CLINIC_OR_DEPARTMENT_OTHER): Payer: Self-pay | Admitting: Emergency Medicine

## 2021-10-26 DIAGNOSIS — Z7982 Long term (current) use of aspirin: Secondary | ICD-10-CM | POA: Diagnosis not present

## 2021-10-26 DIAGNOSIS — W232XXA Caught, crushed, jammed or pinched between a moving and stationary object, initial encounter: Secondary | ICD-10-CM | POA: Diagnosis not present

## 2021-10-26 DIAGNOSIS — I120 Hypertensive chronic kidney disease with stage 5 chronic kidney disease or end stage renal disease: Secondary | ICD-10-CM | POA: Diagnosis not present

## 2021-10-26 DIAGNOSIS — S61215A Laceration without foreign body of left ring finger without damage to nail, initial encounter: Secondary | ICD-10-CM | POA: Diagnosis not present

## 2021-10-26 DIAGNOSIS — S61217A Laceration without foreign body of left little finger without damage to nail, initial encounter: Secondary | ICD-10-CM | POA: Insufficient documentation

## 2021-10-26 DIAGNOSIS — N186 End stage renal disease: Secondary | ICD-10-CM | POA: Insufficient documentation

## 2021-10-26 DIAGNOSIS — S60222A Contusion of left hand, initial encounter: Secondary | ICD-10-CM | POA: Diagnosis not present

## 2021-10-26 DIAGNOSIS — S63287A Dislocation of proximal interphalangeal joint of left little finger, initial encounter: Secondary | ICD-10-CM | POA: Diagnosis not present

## 2021-10-26 DIAGNOSIS — Z992 Dependence on renal dialysis: Secondary | ICD-10-CM | POA: Insufficient documentation

## 2021-10-26 DIAGNOSIS — S63259A Unspecified dislocation of unspecified finger, initial encounter: Secondary | ICD-10-CM

## 2021-10-26 DIAGNOSIS — S61412A Laceration without foreign body of left hand, initial encounter: Secondary | ICD-10-CM

## 2021-10-26 DIAGNOSIS — S6992XA Unspecified injury of left wrist, hand and finger(s), initial encounter: Secondary | ICD-10-CM | POA: Diagnosis present

## 2021-10-26 DIAGNOSIS — S63297A Dislocation of distal interphalangeal joint of left little finger, initial encounter: Secondary | ICD-10-CM | POA: Diagnosis not present

## 2021-10-26 MED ORDER — LIDOCAINE HCL (PF) 1 % IJ SOLN
5.0000 mL | Freq: Once | INTRAMUSCULAR | Status: AC
  Administered 2021-10-26: 5 mL
  Filled 2021-10-26: qty 5

## 2021-10-26 MED ORDER — ACETAMINOPHEN 325 MG PO TABS
650.0000 mg | ORAL_TABLET | Freq: Once | ORAL | Status: AC
  Administered 2021-10-26: 650 mg via ORAL
  Filled 2021-10-26: qty 2

## 2021-10-26 NOTE — ED Provider Notes (Signed)
[facility] EMERGENCY DEPT Provider Note   CSN: [id number] Arrival date & time: 10/26/21  1245     History  Chief Complaint  Patient presents with   Fall   Laceration   Finger Injury    Michelle Rubio is a 71 y.o. female.  Patient is a 71 year old female asked medical history of ESRD status post kidney transplant, Graves' disease status post thyroidectomy, hypertension presenting to the emergency department from her primary care doctor's office after a fall.  The patient states that she tripped and fell in her kitchen earlier today and braced herself with her left hand.  She states that she then immediately noticed bleeding and sustained a cut to her hand.  She states that she went to her primary doctor's who gave her a tetanus shot and had an x-ray that showed a finger dislocation and recommended that she come to the emergency department for further management as well as for laceration repair.  The patient denies hitting her head or losing consciousness, any headache, vision changes, numbness or weakness.  She denies any other injuries from the falls.  She denies any blood thinner use.  She states she has not taken anything for pain yet.  She states she is right-handed.  The history is provided by the patient and medical records.  Fall  Laceration      Home Medications Prior to Admission medications   Medication Sig Start Date End Date Taking? Authorizing Provider  aspirin 81 MG tablet Take 81 mg by mouth daily.    [provider]  brimonidine (ALPHAGAN P) 0.1 % SOLN Place 1 drop into both eyes 2 (two) times daily.    [provider]  levofloxacin (LEVAQUIN) 250 MG tablet Take 250 mg by mouth daily.    [provider]  levothyroxine (SYNTHROID, LEVOTHROID) 112 MCG tablet Take 112 mcg by mouth daily.    [provider]  metoprolol tartrate (LOPRESSOR) 25 MG tablet Take 25 mg by mouth 2 (two) times daily.    [provider]  mycophenolate (MYFORTIC) 180 MG EC tablet Take 360 mg by mouth 2 (two) times daily.     [provider]  Omega-3 Fatty Acids (FISH OIL) 1200 MG CAPS Take 2 capsules by mouth daily.    [provider]  sitaGLIPtin (JANUVIA) 25 MG tablet Take 25 mg by mouth daily.    [provider]  sulfamethoxazole-trimethoprim (BACTRIM,SEPTRA) 400-80 MG per tablet Take 1 tablet by mouth every Monday, Wednesday, and Friday.    [provider]  tacrolimus (PROGRAF) 0.5 MG capsule Take 0.5 mg by mouth every morning. Takes 0.'5mg'$  in addition to the '1mg'$  tablet in the morning    [provider]  tacrolimus (PROGRAF) 1 MG capsule Take 1-2 mg by mouth 2 (two) times daily. Pt takes 1 tablet ('1mg'$ ) in morning and 2 tablets ('2mg'$ ) in evening    [provider]  Travoprost, BAK Free, (TRAVATAN) 0.004 % SOLN ophthalmic solution Place 1 drop into both eyes at bedtime.    [provider]      Allergies    Patient has no known allergies.    Review of Systems   Review of Systems  Physical Exam Updated Vital Signs BP (!) 159/79   Pulse (!) 54   Temp (!) 97.5 F (36.4 C)   Resp 16   Ht '5\' 8"'$  (1.727 m)   Wt 65.8 kg   SpO2 100%   BMI 22.05 kg/m  Physical Exam  Constitutional:      Appearance: Normal appearance.  HENT:     Head: Normocephalic and atraumatic.     Mouth/Throat:     Mouth: Mucous membranes are moist.  Eyes:     Extraocular Movements: Extraocular movements intact.     Conjunctiva/sclera: Conjunctivae normal.  Cardiovascular:     Rate and Rhythm: Normal rate and regular rhythm.  Pulmonary:     Effort: Pulmonary effort is normal.     Breath sounds: Normal breath sounds.  Abdominal:     General: Abdomen is flat.     Palpations: Abdomen is soft.  Musculoskeletal:     Cervical back: Normal range of motion and neck supple.     Comments: No bony tenderness in right upper extremity and bilateral lower extremities.  Tenderness to palpation  of the left fifth digit, no other tenderness to palpation in the left upper extremity.  Obvious deformity to the distal phalanx of the left fifth digit, brisk capillary refill in digits, strength and sensation intact with limited ROM in flexion extension of the DIP.  Skin:    Comments: Approximately 2 cm laceration in the plantar webspace between the left fourth and fifth digits with no active bleeding, no surrounding erythema or warmth  Neurological:     General: No focal deficit present.     Mental Status: She is alert and oriented to person, place, and time.  Psychiatric:        Mood and Affect: Mood normal.        Behavior: Behavior normal.     ED Results / Procedures / Treatments   Labs (all labs ordered are listed, but only abnormal results are displayed) Labs Reviewed - No data to display  EKG None  Radiology No results found.  Procedures Procedures    Medications Ordered in ED Medications  lidocaine (PF) (XYLOCAINE) 1 % injection 5 mL (5 mLs Infiltration Given 10/26/21 1445)  acetaminophen (TYLENOL) tablet 650 mg (650 mg Oral Given 10/26/21 1444)    ED Course/ Medical Decision Making/ A&P Clinical Course as of 10/26/21 1536  Fri Oct 26, 2021  1535 We are unable to upload patient's CD disc that she came with here so repeat hand x-ray was performed.  The patient was signed out to Dr. Pearline Cables at 1530 pending x-ray, laceration repair and likely reduction of her finger dislocation. [VK]    Clinical Course User Index [VK] Ottie Glazier, DO                           Medical Decision Making Patient is a 71 year old female that presented to the emergency department with left hand pain after fall.  Patient did not hit her head and is not on any blood thinners no focal neurologic deficits making ICH or mass effect.  She does have a laceration to her left hand that will require repair.  Tetanus is updated with her primary doctor.  She did have a finger dislocation of the  distal phalanx of the left fifth digit on outside x-ray and will repeat x-ray performed will likely require reduction and splinting.  Amount and/or Complexity of Data Reviewed Radiology: ordered.  Risk OTC drugs. Prescription drug management.           Final Clinical Impression(s) / ED Diagnoses Final diagnoses:  None    Rx / DC Orders ED Discharge Orders     None         Rexanna Louthan,  Norman Herrlich, DO 10/26/21 1536

## 2021-10-26 NOTE — Discharge Instructions (Signed)
Please follow-up with orthopedic hand specialist in the next 1 to 2 weeks for dislocation of the DIP on your left fifth digit.  Follow-up with any healthcare professional in the next 7 to 10 days for suture removal.  You can wash your hands and the sink in the shower but otherwise keep the area clean and dry to prevent infection.  Use antibiotic ointment as preferred.  Wear splint for the next 2 weeks until follow-up with hand.

## 2021-10-26 NOTE — ED Triage Notes (Signed)
Pt arrives to ED with c/o fall. Pt reports she fell today and caught herself with her hands on a ceramic floor. She reports that she tripped. She reports laceration to left hand and left pinky dislocation. She went to her PCP who did an xray of her left hand showing a pinky finger dislocation.

## 2021-10-26 NOTE — ED Notes (Signed)
Patient verbalizes understanding of discharge instructions. Opportunity for questioning and answers were provided. Patient discharged from ED.  °

## 2021-10-26 NOTE — ED Provider Notes (Addendum)
3:29 PM Patient signed out to me by previous ED physician. Pt is a 71 yo female presenting for lac repair with pinky finger dislocation.  Plan:  Xrays pending-reduction Suture and splint.   Physical Exam  BP (!) 159/86 (BP Location: Right Arm)   Pulse (!) 51   Temp 97.8 F (36.6 C) (Oral)   Resp 16   Ht '5\' 8"'$  (1.727 m)   Wt 65.8 kg   SpO2 100%   BMI 22.05 kg/m   Physical Exam Vitals and nursing note reviewed.  Constitutional:      Appearance: Normal appearance.  HENT:     Head: Normocephalic and atraumatic.  Cardiovascular:     Rate and Rhythm: Normal rate.     Pulses:          Radial pulses are 2+ on the left side.  Pulmonary:     Effort: Pulmonary effort is normal.  Skin:    Capillary Refill: Capillary refill takes less than 2 seconds.     Comments: Gross deformity left little finger dip. Lac to web space between fifth and fourth digit.  Neurological:     General: No focal deficit present.     Mental Status: She is alert.     GCS: GCS eye subscore is 4. GCS verbal subscore is 5. GCS motor subscore is 6.     Sensory: Sensation is intact.     Motor: Motor function is intact.     Procedures  .Nerve Block  Date/Time: 10/26/2021 4:03 PM  Performed by: Lianne Cure, DO Authorized by: Lianne Cure, DO   Consent:    Consent given by:  Patient   Risks discussed:  Infection and bleeding   Alternatives discussed:  No treatment Universal protocol:    Immediately prior to procedure, a time out was called: yes     Patient identity confirmed:  Verbally with patient, arm band and provided demographic data Indications:    Indications:  Procedural anesthesia and pain relief Location:    Nerve block body site: left fifth digit.   Laterality:  Left Post-procedure details:    Procedure completion:  Tolerated well, no immediate complications .Marland KitchenLaceration Repair  Date/Time: 10/26/2021 4:03 PM  Performed by: Lianne Cure, DO Authorized by: Lianne Cure, DO    Consent:    Consent obtained:  Verbal   Consent given by:  Patient   Risks, benefits, and alternatives were discussed: yes     Risks discussed:  Infection, need for additional repair and nerve damage   Alternatives discussed:  No treatment Universal protocol:    Immediately prior to procedure, a time out was called: no     Patient identity confirmed:  Verbally with patient, arm band and provided demographic data Anesthesia:    Anesthesia method:  Nerve block Laceration details:    Location: skin between left 4th and 5th digit.   Length (cm):  3 Exploration:    Contaminated: no   Treatment:    Amount of cleaning:  Standard   Irrigation solution:  Sterile saline   Irrigation method:  Syringe   Visualized foreign bodies/material removed: no   Skin repair:    Repair method:  Sutures   Suture size:  5-0   Suture material:  Prolene   Suture technique:  Simple interrupted Approximation:    Approximation:  Close Repair type:    Repair type:  Simple Post-procedure details:    Procedure completion:  Tolerated well, no immediate complications Reduction of dislocation  Date/Time: 10/26/2021 4:04 PM  Performed by: Lianne Cure, DO Authorized by: Lianne Cure, DO  Consent: Verbal consent obtained. Consent given by: patient Patient understanding: patient states understanding of the procedure being performed Imaging studies: imaging studies not available Patient identity confirmed: verbally with patient, hospital-assigned identification number and arm band Time out: Immediately prior to procedure a "time out" was called to verify the correct patient, procedure, equipment, support staff and site/side marked as required. Local anesthesia used: yes Anesthesia: digital block  Anesthesia: Local anesthesia used: yes  Sedation: Patient sedated: no  Patient tolerance: patient tolerated the procedure well with no immediate complications Comments: DIP left 5th digit      ED  Course / MDM   Clinical Course as of 10/26/21 1600  Fri Oct 26, 2021  1535 We are unable to upload patient's CD disc that she came with here so repeat hand x-ray was performed.  The patient was signed out to Dr. Pearline Cables at 1530 pending x-ray, laceration repair and likely reduction of her finger dislocation. [VK]    Clinical Course User Index [VK] Ottie Glazier, DO   Medical Decision Making Amount and/or Complexity of Data Reviewed Radiology: ordered.  Risk OTC drugs. Prescription drug management.   4:01 PM Gross deformity left little finger dip. Lac to web space between fifth and fourth digit.  Repeat nerve block.  Local wound care.  No foreign bodies.  Wound approximated with 6 nonabsorbable sutures.  Recommendations for we will in the next 7 to 10 days by any medical care professional.  Closed reduction of left fifth DIP with no acute complications.  Patient offered x-ray for comparison and declined.  Finger splint applied.  Contact for hand for follow-up provided.  Recommended for minimum of 2 weeks for splint wearing for longer as recommended by hand.  Patient in no distress and overall condition improved here in the ED. Detailed discussions were had with the patient regarding current findings, and need for close f/u with PCP or on call doctor. The patient has been instructed to return immediately if the symptoms worsen in any way for re-evaluation. Patient verbalized understanding and is in agreement with current care plan. All questions answered prior to discharge.       Lianne Cure, DO 10/10/17 [id number]    Lianne Cure, DO 32/54/98 1605

## 2021-10-26 NOTE — ED Notes (Signed)
Finger frog splint at bedside

## 2021-11-03 DIAGNOSIS — I10 Essential (primary) hypertension: Secondary | ICD-10-CM | POA: Diagnosis not present

## 2021-11-03 DIAGNOSIS — H814 Vertigo of central origin: Secondary | ICD-10-CM | POA: Diagnosis not present

## 2021-11-03 DIAGNOSIS — Z940 Kidney transplant status: Secondary | ICD-10-CM | POA: Diagnosis not present

## 2021-11-03 DIAGNOSIS — E038 Other specified hypothyroidism: Secondary | ICD-10-CM | POA: Diagnosis not present

## 2021-11-03 DIAGNOSIS — E1169 Type 2 diabetes mellitus with other specified complication: Secondary | ICD-10-CM | POA: Diagnosis not present

## 2021-11-03 DIAGNOSIS — S61412A Laceration without foreign body of left hand, initial encounter: Secondary | ICD-10-CM | POA: Diagnosis not present

## 2021-11-03 DIAGNOSIS — E785 Hyperlipidemia, unspecified: Secondary | ICD-10-CM | POA: Diagnosis not present

## 2021-11-03 DIAGNOSIS — F064 Anxiety disorder due to known physiological condition: Secondary | ICD-10-CM | POA: Diagnosis not present

## 2021-11-03 DIAGNOSIS — H8393 Unspecified disease of inner ear, bilateral: Secondary | ICD-10-CM | POA: Diagnosis not present

## 2021-11-13 DIAGNOSIS — Z940 Kidney transplant status: Secondary | ICD-10-CM | POA: Diagnosis not present

## 2021-11-13 DIAGNOSIS — Z79624 Long term (current) use of inhibitors of nucleotide synthesis: Secondary | ICD-10-CM | POA: Diagnosis not present

## 2021-11-13 DIAGNOSIS — I10 Essential (primary) hypertension: Secondary | ICD-10-CM | POA: Diagnosis not present

## 2021-11-13 DIAGNOSIS — Z5181 Encounter for therapeutic drug level monitoring: Secondary | ICD-10-CM | POA: Diagnosis not present

## 2021-11-13 DIAGNOSIS — Z79621 Long term (current) use of calcineurin inhibitor: Secondary | ICD-10-CM | POA: Diagnosis not present

## 2021-11-13 DIAGNOSIS — R7989 Other specified abnormal findings of blood chemistry: Secondary | ICD-10-CM | POA: Diagnosis not present

## 2021-11-13 DIAGNOSIS — Z4822 Encounter for aftercare following kidney transplant: Secondary | ICD-10-CM | POA: Diagnosis not present

## 2021-11-13 DIAGNOSIS — N814 Uterovaginal prolapse, unspecified: Secondary | ICD-10-CM | POA: Diagnosis not present

## 2021-11-13 DIAGNOSIS — D8989 Other specified disorders involving the immune mechanism, not elsewhere classified: Secondary | ICD-10-CM | POA: Diagnosis not present

## 2021-11-13 DIAGNOSIS — Z792 Long term (current) use of antibiotics: Secondary | ICD-10-CM | POA: Diagnosis not present

## 2021-11-14 DIAGNOSIS — S63287A Dislocation of proximal interphalangeal joint of left little finger, initial encounter: Secondary | ICD-10-CM | POA: Diagnosis not present

## 2021-11-14 DIAGNOSIS — M79645 Pain in left finger(s): Secondary | ICD-10-CM | POA: Diagnosis not present

## 2021-11-16 DIAGNOSIS — E1169 Type 2 diabetes mellitus with other specified complication: Secondary | ICD-10-CM | POA: Diagnosis not present

## 2021-11-16 DIAGNOSIS — I10 Essential (primary) hypertension: Secondary | ICD-10-CM | POA: Diagnosis not present

## 2021-11-16 DIAGNOSIS — E1122 Type 2 diabetes mellitus with diabetic chronic kidney disease: Secondary | ICD-10-CM | POA: Diagnosis not present

## 2021-11-16 DIAGNOSIS — E038 Other specified hypothyroidism: Secondary | ICD-10-CM | POA: Diagnosis not present

## 2021-11-16 DIAGNOSIS — E782 Mixed hyperlipidemia: Secondary | ICD-10-CM | POA: Diagnosis not present

## 2021-11-19 ENCOUNTER — Other Ambulatory Visit: Payer: Self-pay | Admitting: Family Medicine

## 2021-11-19 ENCOUNTER — Ambulatory Visit
Admission: RE | Admit: 2021-11-19 | Discharge: 2021-11-19 | Disposition: A | Discharge status: Home / Self Care | Payer: PPO | Source: Ambulatory Visit | Attending: Family Medicine | Admitting: Family Medicine

## 2021-11-19 DIAGNOSIS — M25561 Pain in right knee: Secondary | ICD-10-CM

## 2021-11-19 DIAGNOSIS — W19XXXA Unspecified fall, initial encounter: Secondary | ICD-10-CM

## 2021-11-19 DIAGNOSIS — E1122 Type 2 diabetes mellitus with diabetic chronic kidney disease: Secondary | ICD-10-CM | POA: Diagnosis not present

## 2021-11-19 DIAGNOSIS — M25562 Pain in left knee: Secondary | ICD-10-CM

## 2021-11-19 DIAGNOSIS — Z940 Kidney transplant status: Secondary | ICD-10-CM | POA: Diagnosis not present

## 2021-11-19 DIAGNOSIS — E785 Hyperlipidemia, unspecified: Secondary | ICD-10-CM | POA: Diagnosis not present

## 2021-11-19 DIAGNOSIS — I10 Essential (primary) hypertension: Secondary | ICD-10-CM | POA: Diagnosis not present

## 2021-11-19 DIAGNOSIS — E1169 Type 2 diabetes mellitus with other specified complication: Secondary | ICD-10-CM | POA: Diagnosis not present

## 2021-11-19 DIAGNOSIS — M130 Polyarthritis, unspecified: Secondary | ICD-10-CM | POA: Diagnosis not present

## 2021-11-22 DIAGNOSIS — M25642 Stiffness of left hand, not elsewhere classified: Secondary | ICD-10-CM | POA: Diagnosis not present

## 2021-11-26 DIAGNOSIS — Z6822 Body mass index (BMI) 22.0-22.9, adult: Secondary | ICD-10-CM | POA: Diagnosis not present

## 2021-11-26 DIAGNOSIS — Z01419 Encounter for gynecological examination (general) (routine) without abnormal findings: Secondary | ICD-10-CM | POA: Diagnosis not present

## 2021-11-26 DIAGNOSIS — Z1231 Encounter for screening mammogram for malignant neoplasm of breast: Secondary | ICD-10-CM | POA: Diagnosis not present

## 2021-11-26 DIAGNOSIS — Z124 Encounter for screening for malignant neoplasm of cervix: Secondary | ICD-10-CM | POA: Diagnosis not present

## 2021-12-10 DIAGNOSIS — H8393 Unspecified disease of inner ear, bilateral: Secondary | ICD-10-CM | POA: Diagnosis not present

## 2021-12-10 DIAGNOSIS — Z940 Kidney transplant status: Secondary | ICD-10-CM | POA: Diagnosis not present

## 2021-12-10 DIAGNOSIS — N1832 Chronic kidney disease, stage 3b: Secondary | ICD-10-CM | POA: Diagnosis not present

## 2021-12-10 DIAGNOSIS — E1122 Type 2 diabetes mellitus with diabetic chronic kidney disease: Secondary | ICD-10-CM | POA: Diagnosis not present

## 2021-12-10 DIAGNOSIS — I129 Hypertensive chronic kidney disease with stage 1 through stage 4 chronic kidney disease, or unspecified chronic kidney disease: Secondary | ICD-10-CM | POA: Diagnosis not present

## 2021-12-13 ENCOUNTER — Telehealth: Payer: Self-pay | Admitting: Neurology

## 2021-12-13 ENCOUNTER — Ambulatory Visit: Payer: PPO | Admitting: Neurology

## 2021-12-13 ENCOUNTER — Encounter: Payer: Self-pay | Admitting: Neurology

## 2021-12-13 VITALS — BP 116/65 | HR 58 | Ht 68.0 in | Wt 145.0 lb

## 2021-12-13 DIAGNOSIS — R269 Unspecified abnormalities of gait and mobility: Secondary | ICD-10-CM | POA: Diagnosis not present

## 2021-12-13 DIAGNOSIS — R296 Repeated falls: Secondary | ICD-10-CM

## 2021-12-13 DIAGNOSIS — R42 Dizziness and giddiness: Secondary | ICD-10-CM

## 2021-12-13 NOTE — Telephone Encounter (Signed)
healthteam adv NPR sent to GI

## 2021-12-13 NOTE — Progress Notes (Signed)
[facility]  PATIENT: Michelle Rubio DOB: 1950-09-02  REQUESTING CLINICIAN: Lucianne Lei, MD HISTORY FROM: Patient  REASON FOR VISIT: Dizziness and multiple falls    HISTORICAL  CHIEF COMPLAINT:  Chief Complaint  Patient presents with   Dizziness    Rm 13 alone Pt is well, states she only experiences dizziness here and there but when she does experience some it is bad, has had a few falls     HISTORY OF PRESENT ILLNESS:  This is a 71 year old woman past medical history of kidney disease, hypertension, hyperlipidemia, diabetes, hypothyroidism, who is presenting with dizziness since early summer.  Patient reports on July 28 she had her first fall due to the dizziness.  She described the dizziness as room spinning sensation lasting a few minutes.  There are no nausea or vomiting associated with dizziness.  When she has these episodes she is unable to walk which has caused her multiple falls.  She reported a total of 6 falls since July and the last 1 was a couple weeks ago.     OTHER MEDICAL CONDITIONS: Kidney disease, hypertension, hyperlipidemia, DMII, Hypothyroidism    REVIEW OF SYSTEMS: Full 14 system review of systems performed and negative with exception of: as noted in the HPI   ALLERGIES: No Known Allergies  HOME MEDICATIONS: Outpatient Medications Prior to Visit  Medication Sig Dispense Refill   aspirin 81 MG tablet Take 81 mg by mouth daily.     brimonidine (ALPHAGAN P) 0.1 % SOLN Place 1 drop into both eyes 2 (two) times daily.     levothyroxine (SYNTHROID, LEVOTHROID) 112 MCG tablet Take 112 mcg by mouth daily.     metoprolol tartrate (LOPRESSOR) 25 MG tablet Take 25 mg by mouth 2 (two) times daily.     mycophenolate (MYFORTIC) 180 MG EC tablet Take 360 mg by mouth 2 (two) times daily.      Omega-3 Fatty Acids (FISH OIL) 1200 MG CAPS Take 2 capsules by mouth daily.     sitaGLIPtin (JANUVIA) 25 MG tablet Take 25 mg by mouth daily.      sulfamethoxazole-trimethoprim (BACTRIM,SEPTRA) 400-80 MG per tablet Take 1 tablet by mouth every Monday, Wednesday, and Friday.     tacrolimus (PROGRAF) 0.5 MG capsule Take 0.5 mg by mouth every morning. Takes 0.'5mg'$  in addition to the '1mg'$  tablet in the morning     tacrolimus (PROGRAF) 1 MG capsule Take 1-2 mg by mouth 2 (two) times daily. Pt takes 1 tablet ('1mg'$ ) in morning and 2 tablets ('2mg'$ ) in evening     levofloxacin (LEVAQUIN) 250 MG tablet Take 250 mg by mouth daily. (Patient not taking: Reported on 12/13/2021)     Travoprost, BAK Free, (TRAVATAN) 0.004 % SOLN ophthalmic solution Place 1 drop into both eyes at bedtime. (Patient not taking: Reported on 12/13/2021)     No facility-administered medications prior to visit.    PAST MEDICAL HISTORY: Past Medical History:  Diagnosis Date   Acute renal failure on dialysis ([place])    Body mass index (BMI) 20.0-20.9, adult    Chronic kidney disease    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   Glaucoma    Gout    H/O Graves' disease 1980s   Hyperlipidemia    Hypertension    Hypothyroidism    Mitral valve prolapse     PAST SURGICAL HISTORY: Past Surgical History:  Procedure Laterality Date   KIDNEY TRANSPLANT  2012   THYROIDECTOMY, PARTIAL  1985  TONSILLECTOMY     TUBAL LIGATION      FAMILY HISTORY: Family History  Problem Relation Age of Onset   Coronary artery disease Mother    Heart disease Father        CHF    SOCIAL HISTORY: Social History   Socioeconomic History   Marital status: Widowed    Spouse name: Not on file   Number of children: 2   Years of education: Not on file   Highest education level: Not on file  Occupational History   Not on file  Tobacco Use   Smoking status: Never   Smokeless tobacco: Not on file  Substance and Sexual Activity   Alcohol use: No   Drug use: No   Sexual activity: Not on file  Other Topics Concern   Not on file  Social History Narrative   Not on file   Social  Determinants of Health   Financial Resource Strain: Not on file  Food Insecurity: Not on file  Transportation Needs: Not on file  Physical Activity: Not on file  Stress: Not on file  Social Connections: Not on file  Intimate Partner Violence: Not on file    PHYSICAL EXAM  GENERAL EXAM/CONSTITUTIONAL: Vitals:  Vitals:   12/13/21 0807  BP: 116/65  Pulse: (!) 58  Weight: 145 lb (65.8 kg)  Height: '5\' 8"'$  (1.727 m)   Body mass index is 22.05 kg/m. Wt Readings from Last 3 Encounters:  12/13/21 145 lb (65.8 kg)  10/26/21 145 lb (65.8 kg)  12/10/11 148 lb (67.1 kg)   Patient is in no distress; well developed, nourished and groomed; neck is supple  EYES: Pupils round and reactive to light, Visual fields full to confrontation, Extraocular movements intacts, she has sustained nystagmus on horizontal gazes   MUSCULOSKELETAL: Gait, strength, tone, movements noted in Neurologic exam below  NEUROLOGIC: MENTAL STATUS:      No data to display         awake, alert, oriented to person, place and time recent and remote memory intact normal attention and concentration language fluent, comprehension intact, naming intact fund of knowledge appropriate  CRANIAL NERVE:  2nd, 3rd, 4th, 6th - pupils equal and reactive to light, visual fields full to confrontation, extraocular muscles intact, there is sustained nystagmus on horizontal gazes.  5th - facial sensation symmetric 7th - facial strength symmetric 8th - hearing intact, ear canal cleared of wax.  9th - palate elevates symmetrically, uvula midline 11th - shoulder shrug symmetric 12th - tongue protrusion midline  MOTOR:  normal bulk and tone, full strength in the BUE, BLE  SENSORY:  normal and symmetric to light touch  COORDINATION:  finger-nose-finger, fine finger movements normal, no ataxia   GAIT/STATION:  Very unsteady, narrow based, unable to walk without assistance. Unable to tandem   DIAGNOSTIC DATA (LABS,  IMAGING, TESTING) - I reviewed patient records, labs, notes, testing and imaging myself where available.  Lab Results  Component Value Date   WBC 4.5 12/06/2011   HGB 12.4 12/06/2011   HCT 37.8 12/06/2011   MCV 88.5 12/06/2011   PLT 182 12/06/2011      Component Value Date/Time   NA 138 12/06/2011 1014   K 4.9 12/06/2011 1014   CL 103 12/06/2011 1014   CO2 27 12/06/2011 1014   GLUCOSE 102 (H) 12/06/2011 1014   BUN 24 (H) 12/06/2011 1014   CREATININE 1.15 (H) 12/06/2011 1014   CALCIUM 9.7 12/06/2011 1014   GFRNONAA 51 (L) 12/06/2011  [place] (L) 12/06/2011 1014   No results found for: "CHOL", "HDL", "LDLCALC", "LDLDIRECT", "TRIG", "CHOLHDL" No results found for: "HGBA1C" No results found for: "VITAMINB12" No results found for: "TSH"   ASSESSMENT AND PLAN  71 y.o. year old female with history of chronic kidney disease, hypertension, hyperlipidemia, diabetes mellitus, hypothyroidism, who is presenting with ongoing dizziness for the past 4 to 5 months which resulted in multiple falls.  Patient is very unsteady, even now she is not symptomatic but she is unsteady which required her to use a cane.  On exam she has she sustained nystagmus on horizontal gaze bilaterally which does not fatigue, suspect central etiology.  I will start by getting obtaining MRI of her brain and also referring her to physical therapy for gait training.  We will see her in 3 months for follow-up.  Advised her to contact me sooner if her symptoms worsen.   1. Dizziness   2. Multiple falls   3. Abnormal gait      Patient Instructions  MRI brain without contrast, I will contact you to go over the results Referral to physical therapy for gait training Continue your other medications Follow-up in 3 months or sooner if worse  Orders Placed This Encounter  Procedures   MR BRAIN WO CONTRAST   Ambulatory referral to Physical Therapy    No orders of the defined types were placed in this  encounter.   Return in about 3 months (around 03/14/2022).    Alric Ran, MD 12/13/2021, 8:34 AM  [facility] [address] [place], [place] [address] [phone]

## 2021-12-13 NOTE — Patient Instructions (Signed)
MRI brain without contrast, I will contact you to go over the results Referral to physical therapy for gait training Continue your other medications Follow-up in 3 months or sooner if worse

## 2021-12-19 NOTE — Therapy (Incomplete)
OUTPATIENT PHYSICAL THERAPY VESTIBULAR EVALUATION     Patient Name: Michelle Rubio MRN: [id number] DOB:1950/12/15, 71 y.o., female Today's Date: 12/20/2021  PCP: Lucianne Lei, MD REFERRING PROVIDER: Alric Ran, MD   PT End of Session - 12/20/21 0802     Visit Number 1    Number of Visits 9    Date for PT Re-Evaluation 02/18/22    Authorization Type [employer]    PT Start Time 0800    PT Stop Time 0843    PT Time Calculation (min) 43 min    Activity Tolerance Patient tolerated treatment well    Behavior During Therapy [facility] for tasks assessed/performed             Past Medical History:  Diagnosis Date   Acute renal failure on dialysis ([place])    Body mass index (BMI) 20.0-20.9, adult    Chronic kidney disease    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   Glaucoma    Gout    H/O Graves' disease 1980s   Hyperlipidemia    Hypertension    Hypothyroidism    Mitral valve prolapse    Past Surgical History:  Procedure Laterality Date   KIDNEY TRANSPLANT  2012   THYROIDECTOMY, PARTIAL  1985   TONSILLECTOMY     TUBAL LIGATION     Patient Active Problem List   Diagnosis Date Noted   Preop cardiovascular exam 12/10/2011   Abnormal electrocardiogram 12/10/2011   Hypertension    Hypothyroidism    Chronic kidney disease    H/O Graves' disease     ONSET DATE: 12/13/2021   REFERRING DIAG: R42 (ICD-10-CM) - Dizziness R29.6 (ICD-10-CM) - Multiple falls R26.9 (ICD-10-CM) - Abnormal gait   THERAPY DIAG:  Dizziness and giddiness  Unsteadiness on feet  Other abnormalities of gait and mobility  History of falling  Rationale for Evaluation and Treatment Rehabilitation  SUBJECTIVE:   SUBJECTIVE STATEMENT: Has been falling a lot. Not too steady on her feet. Dizziness happens from time to time and feels like everything is spinning.  Feels like she is wobbling from side to side. Tries to be careful - had to buy a cane to feel more steady on her  feet. Has been using her cane for about 3 months. Dizziness started early summer. Used to walk, but is not walking anymore. Has had 5 falls, one falls she dislocated her finger. Has brain MRI scheduled for 12/27/21  Pt accompanied by: self  PERTINENT HISTORY: PMH: kidney disease, hypertension, hyperlipidemia, diabetes, hypothyroidism.   Per Dr. Maryan Puls: On exam she has she sustained nystagmus on horizontal gaze bilaterally which does not fatigue, suspect central etiology.   PAIN:  Are you having pain? No  PRECAUTIONS: Fall  FALLS: Has patient fallen in last 6 months? Yes. Number of falls 5  LIVING ENVIRONMENT: Lives with: lives alone Lives in: House/apartment Stairs: No Has following equipment at home: Single point cane  PLOF: Independent and Leisure: Enjoys walking, being active in her church (has been unable to participate due to dizziness).   PATIENT GOALS Wants to walk normally.   OBJECTIVE:   DIAGNOSTIC FINDINGS: Scheduled for brain MRI 12/27/21  COGNITION: Overall cognitive status: Within functional limits for tasks assessed   SENSATION: WFL  COORDINATION:  RAM: more difficulty with LUE  Finger to nose: WFL RUE, mildly dysmetric LUE initially, but improved with incr reps.    TRANSFERS: Assistive device utilized: Single point cane  Sit to stand: SBA Stand to sit:  SBA  Pressed up with cane and from chair.   GAIT: Gait pattern: step through pattern and wide BOS, unsteadiness with gait.  Distance walked: In and out of session Assistive device utilized: Single point cane Level of assistance: SBA Comments: Pt has had to use a cane for the past few months, prior was using no AD.    PATIENT SURVEYS:  FOTO DFS: 45, DPS: 58   VESTIBULAR ASSESSMENT   GENERAL OBSERVATION: Ambulates into session with SPC.     SYMPTOM BEHAVIOR:   Subjective history: See above. Reports episode started at the beginning of summer spontaneously. Has had 5 falls since then and now has  to ambulate with a cane. Reports sometimes it is worse in the grocery store.    Non-Vestibular symptoms:  N/A   Type of dizziness: Imbalance (Disequilibrium), Spinning/Vertigo, and "World moves"   Frequency: 1x a week.   Duration: Few minutes.    Aggravating factors: Spontaneous - pt unable to name anything that makes it worse.    Relieving factors:  sitting and being still.    Progression of symptoms: worse   OCULOMOTOR EXAM:   Ocular Alignment: normal   Ocular ROM: No Limitations   Spontaneous Nystagmus: absent   Gaze-Induced Nystagmus: right beating with right gaze and left beating with left gaze, nystagmus does not fatigue.    Smooth Pursuits: intact   Saccades:  ~2-3 saccadic beats from R/L <> midline.      VESTIBULAR - OCULAR REFLEX:    Slow VOR: Normal   VOR Cancellation: Normal   Head-Impulse Test: HIT Right: positive HIT Left: positive Pt with mild dizziness afterwards.      POSITIONAL TESTING: Right Dix-Hallpike: appeared to be mild downbeating that did not fatigue Left Dix-Hallpike: appeared to be mild apogeotropic nystagmus that did not fatigue Right Roll Test: no dizziness, mild nystagmus (unable to determine direction) Left Roll Test: no dizziness, mild nystagmus (unable to determine direction) Right Sidelying: very mild nystagmus (appeared to be downbeating, but difficult to tell direction), did not fatigue.  Left Sidelying: apogeotropic nystagmus, mild dizziness, nystagmus did not fatigue.     MOTION SENSITIVITY:    Motion Sensitivity Quotient  Intensity: 0 = none, 1 = Lightheaded, 2 = Mild, 3 = Moderate, 4 = Severe, 5 = Vomiting  Intensity  1. Sitting to supine 0  2. Supine to L side 0  3. Supine to R side 0  4. Supine to sitting 0  5. L Hallpike-Dix 2  6. Up from L  0  7. R Hallpike-Dix 2  8. Up from R  0  9. Sitting, head  tipped to L knee 0  10. Head up from L  knee 0  11. Sitting, head  tipped to R knee 0  12. Head up from R  knee 0  13.  Sitting head turns x5 2  14.Sitting head nods x5 2  15. In stance, 180  turn to L  2  16. In stance, 180  turn to R 2       M-CTSIB  Condition 1: Firm Surface, EO 30 Sec, Normal Sway  Condition 2: Firm Surface, EC 12 Sec, Mild Sway  Condition 3: Foam Surface, EO 10 Sec, Mild Sway  Condition 4: Foam Surface, EC 1 Sec      VESTIBULAR TREATMENT:  N/A during eval.    PATIENT EDUCATION: Education details: Discussed clinical findings, POC. Person educated: Patient Education method: Explanation Education comprehension: verbalized understanding   GOALS: Goals reviewed  with patient? Yes  SHORT TERM GOALS: Target date: 01/10/2022   Pt will be independent with initial HEP in order to build upon functional gains made in therapy. Baseline: Goal status: INITIAL  2.  Pt will improve condition 2 of mCTSIB to at least 20 seconds in order to demo improved balance with EC.  Baseline: 12 seconds  Goal status: INITIAL  3.  Pt will improve condition 3 of mCTSIB to at least 15 seconds in order to demo improved balance on compliant surfaces.  Baseline: 10 seconds  Goal status: INITIAL   LONG TERM GOALS: Target date: 01/31/2022    Pt will be independent with final HEP in order to build upon functional gains made in therapy. Baseline:  Goal status: INITIAL  2.  Pt will report 0/5 on items on MSQ in order to demo improved motion sensitivity/habituation.  Baseline:  Goal status: INITIAL  3.  Pt will improve DFS to at least a 57 in order to demo improved functional outcomes with dizziness.  Baseline: 45 Goal status: INITIAL  4.  Further balance test to be assessed with LTG written. Baseline:  Goal status: INITIAL  5.  Pt will improve condition 2 of mCTSIB to at least 30 seconds in order to demo improved balance with EC.  Baseline: 12 Sec, Mild Sway Goal status: INITIAL  6.  Pt will perform condition 4 of mCTSIB for at least 8 seconds in order to demo improves vestibular input  for balance.  Baseline: 1 second Goal status: INITIAL  ASSESSMENT:  CLINICAL IMPRESSION: Patient is a 71 year old female referred to Neuro OPPT for dizziness/multiple falls.  Pt recently saw the neurologist for dizziness, who suspects central etiology. Pt is scheduled for brain MRI on the 28th. Pt's PMH is significant for: kidney disease, hypertension, hyperlipidemia, diabetes, hypothyroidism. The following deficits were present during the exam: motion sensitivities, direction changing nystagmus, nystagmus that does not fatigue, nystagmus that is not consistent with positional testing, impaired saccades, impaired balance (esp EC and on compliant surfaces), unsteadiness with gait/gait abnormalities, decr activity tolerance, impaired VOR, impaired coordination.  Pt would benefit from skilled PT to address these impairments and functional limitations to maximize functional mobility independence    OBJECTIVE IMPAIRMENTS Abnormal gait, decreased activity tolerance, decreased balance, decreased coordination, decreased knowledge of use of DME, decreased mobility, difficulty walking, and dizziness.   ACTIVITY LIMITATIONS carrying, lifting, bending, squatting, stairs, transfers, and locomotion level  PARTICIPATION LIMITATIONS: community activity, church, and walking outside   [place] Behavior pattern, Past/current experiences, Time since onset of injury/illness/exacerbation, and 3+ comorbidities: kidney disease, hypertension, hyperlipidemia, diabetes, hypothyroidism.   are also affecting patient's functional outcome.   REHAB POTENTIAL: Good  CLINICAL DECISION MAKING: Evolving/moderate complexity  EVALUATION COMPLEXITY: Moderate   PLAN: PT FREQUENCY: 1-2x/week - pt only wishing to do 1x week   PT DURATION: 8 weeks  PLANNED INTERVENTIONS: Therapeutic exercises, Therapeutic activity, Neuromuscular re-education, Balance training, Gait training, Patient/Family education, Self Care, Stair  training, Vestibular training, DME instructions, and Re-evaluation  PLAN FOR NEXT SESSION: Initial HEP for balance - EC, unlevel surfaces, head motions. VOR exercises. Please adjust pt's cane to the right height (I forgot). Perform further balance assessment for fall risk, prob DGI?    Arliss Journey, PT, DPT 12/20/2021, 8:48 AM

## 2021-12-20 ENCOUNTER — Ambulatory Visit: Payer: PPO | Attending: Neurology | Admitting: Physical Therapy

## 2021-12-20 ENCOUNTER — Encounter: Payer: Self-pay | Admitting: Physical Therapy

## 2021-12-20 DIAGNOSIS — R2681 Unsteadiness on feet: Secondary | ICD-10-CM | POA: Diagnosis not present

## 2021-12-20 DIAGNOSIS — Z9181 History of falling: Secondary | ICD-10-CM | POA: Diagnosis not present

## 2021-12-20 DIAGNOSIS — R42 Dizziness and giddiness: Secondary | ICD-10-CM | POA: Insufficient documentation

## 2021-12-20 DIAGNOSIS — R269 Unspecified abnormalities of gait and mobility: Secondary | ICD-10-CM | POA: Insufficient documentation

## 2021-12-20 DIAGNOSIS — R296 Repeated falls: Secondary | ICD-10-CM | POA: Insufficient documentation

## 2021-12-20 DIAGNOSIS — R2689 Other abnormalities of gait and mobility: Secondary | ICD-10-CM | POA: Insufficient documentation

## 2021-12-24 ENCOUNTER — Ambulatory Visit
Admission: RE | Admit: 2021-12-24 | Discharge: 2021-12-24 | Disposition: A | Discharge status: Home / Self Care | Payer: PPO | Source: Ambulatory Visit | Attending: Neurology | Admitting: Neurology

## 2021-12-24 ENCOUNTER — Telehealth: Payer: Self-pay | Admitting: Neurology

## 2021-12-24 ENCOUNTER — Other Ambulatory Visit: Payer: Self-pay | Admitting: Neurology

## 2021-12-24 DIAGNOSIS — R296 Repeated falls: Secondary | ICD-10-CM

## 2021-12-24 DIAGNOSIS — R269 Unspecified abnormalities of gait and mobility: Secondary | ICD-10-CM

## 2021-12-24 DIAGNOSIS — R42 Dizziness and giddiness: Secondary | ICD-10-CM

## 2021-12-24 MED ORDER — GADOBUTROL 1 MMOL/ML IV SOLN
7.0000 mL | Freq: Once | INTRAVENOUS | Status: AC | PRN
  Administered 2021-12-24: 7 mL via INTRAVENOUS

## 2021-12-24 NOTE — Telephone Encounter (Signed)
[place] pt has something in the imaging need to use contrast

## 2021-12-24 NOTE — Telephone Encounter (Signed)
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

## 2021-12-25 ENCOUNTER — Ambulatory Visit: Payer: PPO | Admitting: Physical Therapy

## 2021-12-25 DIAGNOSIS — R42 Dizziness and giddiness: Secondary | ICD-10-CM | POA: Diagnosis not present

## 2021-12-25 DIAGNOSIS — R2681 Unsteadiness on feet: Secondary | ICD-10-CM

## 2021-12-25 DIAGNOSIS — R2689 Other abnormalities of gait and mobility: Secondary | ICD-10-CM

## 2021-12-25 NOTE — Patient Instructions (Signed)
Turn    Head centered, chin horizontal, slowly turn to look over right shoulder then left shoulder. Hold __1-2_ seconds at each shoulder. Repeat __5-10_ times. Do _1__ times per day   DO IN SEATED POSITION FIRST, TRY IN STANDING IF YOU FEEL ABLE TO DO SO.      Standing Marching - at counter for safety   Using a chair if necessary, march in place. Repeat 10 times. Do 1 sessions per day.   Hip Backward Kick   Using a chair for balance, keep legs shoulder width apart and toes pointed for- ward. Slowly extend one leg back, keeping knee straight. Do not lean forward. Repeat with other leg. Repeat 10 times. Do 1 sessions per day.    Hip Side Kick   Holding a chair for balance, keep legs shoulder width apart and toes pointed forward. Swing a leg out to side, keeping knee straight. Do not lean. Repeat using other leg. Repeat 10 times. Do 1 sessions per day.   ALSO DO FORWARD KICKS - ALTERNATING 10 REPS EACH LEG    Feet Heel-Toe "Tandem", Varied Arm Positions - Eyes Open - DO PARTIAL HEEL TO TOE - DO NOT DO DIRECT HEEL TO TOE   With eyes open, right foot directly in front of the other, arms out, look straight ahead at a stationary object. Hold 15 seconds. Repeat 1  times per session. Do 1 sessions per day.      Standing On One Leg Without Support .  Stand on one leg in neutral spine without support. Hold 10 seconds. Repeat on other leg. Do 1-2 repetitions, 1 sets.    Side-Stepping   Walk to left side with eyes open. Take even steps, leading with same foot. Make sure each foot lifts off the floor. Repeat in opposite direction. Repeat foR 2-3 TIMES ALONG COUNTER.  Do 1 sessions per day.  Copyright  VHI. All rights reserved.       SIT TO STAND: No Device - place chair or walker in front    Sit with feet shoulder-width apart, on floor. Lean chest forward, raise hips up from surface. Straighten hips and knees. Weight bear equally on left and right  sides. _10__ reps per set, __1_ sets per day, __5_ days per week Place left leg closer to sitting surface.

## 2021-12-26 ENCOUNTER — Telehealth: Payer: Self-pay

## 2021-12-26 ENCOUNTER — Encounter: Payer: Self-pay | Admitting: Physical Therapy

## 2021-12-26 ENCOUNTER — Telehealth: Payer: Self-pay | Admitting: Physical Therapy

## 2021-12-26 DIAGNOSIS — R9089 Other abnormal findings on diagnostic imaging of central nervous system: Secondary | ICD-10-CM

## 2021-12-26 DIAGNOSIS — Z0289 Encounter for other administrative examinations: Secondary | ICD-10-CM | POA: Diagnosis not present

## 2021-12-26 NOTE — Therapy (Signed)
OUTPATIENT PHYSICAL THERAPY VESTIBULAR EVALUATION     Patient Name: Michelle Rubio MRN: [id number] DOB:20-Dec-1950, 71 y.o., female Today's Date: 12/26/2021  PCP: Lucianne Lei, MD REFERRING PROVIDER: Alric Ran, MD   PT End of Session - 12/26/21 [phone]     Visit Number 2    Number of Visits 9    Date for PT Re-Evaluation 02/18/22    Authorization Type [employer]    PT Start Time 0935    PT Stop Time 1015    PT Time Calculation (min) 40 min    Equipment Utilized During Treatment Other (comment)   RW   Activity Tolerance Patient tolerated treatment well    Behavior During Therapy WFL for tasks assessed/performed             Past Medical History:  Diagnosis Date   Acute renal failure on dialysis ([place])    Body mass index (BMI) 20.0-20.9, adult    Chronic kidney disease    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   Glaucoma    Gout    H/O Graves' disease 1980s   Hyperlipidemia    Hypertension    Hypothyroidism    Mitral valve prolapse    Past Surgical History:  Procedure Laterality Date   KIDNEY TRANSPLANT  2012   THYROIDECTOMY, PARTIAL  1985   TONSILLECTOMY     TUBAL LIGATION     Patient Active Problem List   Diagnosis Date Noted   Preop cardiovascular exam 12/10/2011   Abnormal electrocardiogram 12/10/2011   Hypertension    Hypothyroidism    Chronic kidney disease    H/O Graves' disease     ONSET DATE: 12/13/2021   REFERRING DIAG: R42 (ICD-10-CM) - Dizziness R29.6 (ICD-10-CM) - Multiple falls R26.9 (ICD-10-CM) - Abnormal gait   THERAPY DIAG:  Unsteadiness on feet  Other abnormalities of gait and mobility  Rationale for Evaluation and Treatment Rehabilitation  SUBJECTIVE:   SUBJECTIVE STATEMENT: Pt states she had MRI done on the 25th - is waiting to get results; pt report she wants to hold off on PT until results of MRI obtained.  Pt amb. With [facility] - has very unsteady gait; reports no dizziness at this time Pt accompanied  by: self  PERTINENT HISTORY: PMH: kidney disease, hypertension, hyperlipidemia, diabetes, hypothyroidism.   Per Dr. Maryan Puls: On exam she has she sustained nystagmus on horizontal gaze bilaterally which does not fatigue, suspect central etiology.   PAIN:  Are you having pain? No  PRECAUTIONS: Fall  FALLS: Has patient fallen in last 6 months? Yes. Number of falls 5  LIVING ENVIRONMENT: Lives with: lives alone Lives in: House/apartment Stairs: No Has following equipment at home: Single point cane  PLOF: Independent and Leisure: Enjoys walking, being active in her church (has been unable to participate due to dizziness).   PATIENT GOALS Wants to walk normally.   OBJECTIVE:   NeuroRe-ed:  Pt performed the following exercises for HEP to address balance and vestibular deficits    Turn    Head centered, chin horizontal, slowly turn to look over right shoulder then left shoulder. Hold __1-2_ seconds at each shoulder. Repeat __5-10_ times. Do _1__ times per day   DO IN SEATED POSITION FIRST, TRY IN STANDING IF YOU FEEL ABLE TO DO SO.      Standing Marching - at counter for safety   Using a chair if necessary, march in place. Repeat 10 times. Do 1 sessions per day.   Hip Backward Kick  Using a chair for balance, keep legs shoulder width apart and toes pointed for- ward. Slowly extend one leg back, keeping knee straight. Do not lean forward. Repeat with other leg. Repeat 10 times. Do 1 sessions per day.    Hip Side Kick   Holding a chair for balance, keep legs shoulder width apart and toes pointed forward. Swing a leg out to side, keeping knee straight. Do not lean. Repeat using other leg. Repeat 10 times. Do 1 sessions per day.   ALSO DO FORWARD KICKS - ALTERNATING 10 REPS EACH LEG    Feet Heel-Toe "Tandem", Varied Arm Positions - Eyes Open - DO PARTIAL HEEL TO TOE - DO NOT DO DIRECT HEEL TO TOE   With eyes open, right foot directly in front of the other,  arms out, look straight ahead at a stationary object. Hold 15 seconds. Repeat 1  times per session. Do 1 sessions per day.      Standing On One Leg Without Support .  Stand on one leg in neutral spine without support. Hold 10 seconds. Repeat on other leg. Do 1-2 repetitions, 1 sets.    Side-Stepping   Walk to left side with eyes open. Take even steps, leading with same foot. Make sure each foot lifts off the floor. Repeat in opposite direction. Repeat foR 2-3 TIMES ALONG COUNTER.  Do 1 sessions per day.  Copyright  VHI. All rights reserved.       SIT TO STAND: No Device - place chair or walker in front    Sit with feet shoulder-width apart, on floor. Lean chest forward, raise hips up from surface. Straighten hips and knees. Weight bear equally on left and right sides. _10__ reps per set, __1_ sets per day, __5_ days per week Place left leg closer to sitting surface.    GAIT: Gait pattern: step through pattern and wide BOS, unsteadiness with gait.  Distance walked: approx. 100' Assistive device utilized: Standard RW Level of assistance: SBA Comments: instructed pt in correct hand placement with sit to/from transfers with use of RW; instructed in correct placement of body/feet during amb. With use of RW, especially during turning    Recommended use of RW for pt to maximize safety with ambulation to reduce risk of falling; rollator not recommended due to pt's balance deficits - more stability provided by standard RW is needed to maximize safety    PATIENT EDUCATION: Education details: Discussed recommendation for standard RW and process for obtaining one - pt is very reluctant to pursue/agree to use of this assistive device at this time - pt did agree to PT requesting order from Dr. April Manson so she would have this if she did decide to obtain RW Person educated: Patient Education method: Explanation Education comprehension: verbalized understanding   GOALS: Goals  reviewed with patient? Yes  SHORT TERM GOALS: Target date: 01/10/2022   Pt will be independent with initial HEP in order to build upon functional gains made in therapy. Baseline: Goal status: INITIAL  2.  Pt will improve condition 2 of mCTSIB to at least 20 seconds in order to demo improved balance with EC.  Baseline: 12 seconds  Goal status: INITIAL  3.  Pt will improve condition 3 of mCTSIB to at least 15 seconds in order to demo improved balance on compliant surfaces.  Baseline: 10 seconds  Goal status: INITIAL   LONG TERM GOALS: Target date: 01/31/2022    Pt will be independent with final HEP in order to build  upon functional gains made in therapy. Baseline:  Goal status: INITIAL  2.  Pt will report 0/5 on items on MSQ in order to demo improved motion sensitivity/habituation.  Baseline:  Goal status: INITIAL  3.  Pt will improve DFS to at least a 57 in order to demo improved functional outcomes with dizziness.  Baseline: 45 Goal status: INITIAL  4.  Further balance test to be assessed with LTG written. Baseline:  Goal status: INITIAL  5.  Pt will improve condition 2 of mCTSIB to at least 30 seconds in order to demo improved balance with EC.  Baseline: 12 Sec, Mild Sway Goal status: INITIAL  6.  Pt will perform condition 4 of mCTSIB for at least 8 seconds in order to demo improves vestibular input for balance.  Baseline: 1 second Goal status: INITIAL  ASSESSMENT:  CLINICAL IMPRESSION: PT session focused on establishing HEP for balance and on pt education/instruction in proper use of RW for assistance with amb. To maximize safety with gait to reduce fall risk.  Pt is currently amb. With use of SPC but is very unsteady and is at high risk for falls.  Pt wishes to hold PT until results of MRI (completed 12-24-21) received from MD.  Focus of session was on pt's safety to minimize fall risk (use of RW for amb.) and on balance HEP with UE support for safety to maintain  current functional status and mobility.  Remaining PT appts cancelled at this time per pt's request.   OBJECTIVE IMPAIRMENTS Abnormal gait, decreased activity tolerance, decreased balance, decreased coordination, decreased knowledge of use of DME, decreased mobility, difficulty walking, and dizziness.   ACTIVITY LIMITATIONS carrying, lifting, bending, squatting, stairs, transfers, and locomotion level  PARTICIPATION LIMITATIONS: community activity, church, and walking outside   [place] Behavior pattern, Past/current experiences, Time since onset of injury/illness/exacerbation, and 3+ comorbidities: kidney disease, hypertension, hyperlipidemia, diabetes, hypothyroidism.   are also affecting patient's functional outcome.   REHAB POTENTIAL: Good  CLINICAL DECISION MAKING: Evolving/moderate complexity  EVALUATION COMPLEXITY: Moderate   PLAN: PT FREQUENCY: 1-2x/week - pt only wishing to do 1x week   PT DURATION: 8 weeks  PLANNED INTERVENTIONS: Therapeutic exercises, Therapeutic activity, Neuromuscular re-education, Balance training, Gait training, Patient/Family education, Self Care, Stair training, Vestibular training, DME instructions, and Re-evaluation  PLAN FOR NEXT SESSION: Pt requested to cancel PT appts until MRI results have been received from MD; plan to contact pt to let her know when order for RW received from Dr. Merlene Pulling, [place], PT 12/26/2021, 8:47 AM

## 2021-12-26 NOTE — Telephone Encounter (Signed)
Dr. April Manson, Trudee Grip is receiving PT to address dizziness and gait abnormality.  She would benefit from use of RW to increase safety with gait and to reduce fall risk.  If you agree with this recommendation, would you please place order for "Standard Rolling Walker with 5" wheels" and we will assist her in obtaining this device.  Thank you, Guido Sander, PT

## 2021-12-26 NOTE — Telephone Encounter (Signed)
I called Neurosurgery and spoke with scheduler- she called the on call MD (Dr. Zada Finders) and he accepted the pt.   She is scheduled to see Dr. Zada Finders tomorrow at 915.

## 2021-12-26 NOTE — Telephone Encounter (Signed)
I spoke with patient about results. 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 Fortunately she is feeling well. No headaches or nausea. Will request stat neurosurgery consult. If any worsening sxs, instructed her to go to ER.   Penni Bombard, MD [id number]/[id number], 2:15 PM Certified in Neurology, Neurophysiology and Neuroimaging  [facility] [address], [place], [place] [address] [phone]

## 2021-12-26 NOTE — Addendum Note (Signed)
Addended by: Verlin Grills on: 12/26/2021 05:01 PM   Modules accepted: Orders

## 2021-12-26 NOTE — Telephone Encounter (Signed)
Dr. Leta Baptist, could you review MRI for this pt while Dr. April Manson of the clinic?    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  The cyst exerts mass effect on the brainstem with shifting towards the right.  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  Other tumors in adults with cysts and mural nodules that could have similar characteristics include pilocytic astrocytoma, pleomorphic xanthoastrocytoma but these are less likely in this patient's age and at this location Scattered T2/FLAIR hyperintense foci consistent with mild chronic medical ischemic change.   Mild generalized cortical atrophy, normal for age

## 2021-12-27 ENCOUNTER — Other Ambulatory Visit: Payer: PPO

## 2021-12-27 ENCOUNTER — Telehealth: Payer: Self-pay | Admitting: Neurology

## 2021-12-27 DIAGNOSIS — D333 Benign neoplasm of cranial nerves: Secondary | ICD-10-CM | POA: Diagnosis not present

## 2021-12-27 NOTE — Telephone Encounter (Signed)
Referral for neurosurgery sent to [facility]. Phone: [phone], Fax: [phone]

## 2021-12-31 ENCOUNTER — Other Ambulatory Visit: Payer: Self-pay | Admitting: Neurology

## 2021-12-31 DIAGNOSIS — R269 Unspecified abnormalities of gait and mobility: Secondary | ICD-10-CM

## 2021-12-31 DIAGNOSIS — R296 Repeated falls: Secondary | ICD-10-CM

## 2021-12-31 NOTE — Telephone Encounter (Signed)
Order for Rollator completed   Thank you  Dr. April Manson

## 2021-12-31 NOTE — Telephone Encounter (Signed)
I am not sure if you are aware, but patient has a large cerebellar mass. We have sent her to neurosurgery.

## 2022-01-01 ENCOUNTER — Ambulatory Visit: Payer: PPO | Admitting: Physical Therapy

## 2022-01-01 DIAGNOSIS — H401113 Primary open-angle glaucoma, right eye, severe stage: Secondary | ICD-10-CM | POA: Diagnosis not present

## 2022-01-01 DIAGNOSIS — H401122 Primary open-angle glaucoma, left eye, moderate stage: Secondary | ICD-10-CM | POA: Diagnosis not present

## 2022-01-04 DIAGNOSIS — R2689 Other abnormalities of gait and mobility: Secondary | ICD-10-CM | POA: Diagnosis not present

## 2022-01-04 NOTE — Progress Notes (Signed)
Spoke with with patient. He has already seen neurosurgery Dr. Zada Finders and she is pending surgery.

## 2022-01-08 ENCOUNTER — Other Ambulatory Visit: Payer: Self-pay | Admitting: Neurological Surgery

## 2022-01-08 ENCOUNTER — Encounter: Payer: PPO | Admitting: Physical Therapy

## 2022-01-15 ENCOUNTER — Encounter: Payer: PPO | Admitting: Physical Therapy

## 2022-01-17 DIAGNOSIS — W19XXXD Unspecified fall, subsequent encounter: Secondary | ICD-10-CM | POA: Diagnosis not present

## 2022-01-17 DIAGNOSIS — Z7984 Long term (current) use of oral hypoglycemic drugs: Secondary | ICD-10-CM | POA: Diagnosis not present

## 2022-01-17 DIAGNOSIS — M25561 Pain in right knee: Secondary | ICD-10-CM | POA: Diagnosis not present

## 2022-01-17 DIAGNOSIS — E1122 Type 2 diabetes mellitus with diabetic chronic kidney disease: Secondary | ICD-10-CM | POA: Diagnosis not present

## 2022-01-17 DIAGNOSIS — I10 Essential (primary) hypertension: Secondary | ICD-10-CM | POA: Diagnosis not present

## 2022-01-17 DIAGNOSIS — N186 End stage renal disease: Secondary | ICD-10-CM | POA: Diagnosis not present

## 2022-01-17 DIAGNOSIS — Z940 Kidney transplant status: Secondary | ICD-10-CM | POA: Diagnosis not present

## 2022-01-17 NOTE — Pre-Procedure Instructions (Signed)
Surgical Instructions    Your procedure is scheduled on Monday, October 23.  Report to [facility] Main Entrance "A" at 5:30 A.M., then check in with the Admitting office.  Call this number if you have problems the morning of surgery:  [phone]   If you have any questions prior to your surgery date call [phone]: Open Monday-Friday 8am-4pm If you experience any cold or flu symptoms such as cough, fever, chills, shortness of breath, etc. between now and your scheduled surgery, please notify us at the above number     Remember:  Do not eat after midnight the night before your surgery  You may drink clear liquids until 4:30AM the morning of your surgery.   Clear liquids allowed are: Water, Non-Citrus Juices (without pulp), Carbonated Beverages, Clear Tea, Black Coffee ONLY (NO MILK, CREAM OR POWDERED CREAMER of any kind), and Gatorade    Take these medicines the morning of surgery with A SIP OF WATER:  Bempedoic Acid (NEXLETOL)  ezetimibe (ZETIA) levothyroxine (SYNTHROID)  metoprolol tartrate (LOPRESSOR) mycophenolate (MYFORTIC)  tacrolimus (PROGRAF)  Sulfamethoxazole-trimethoprim (BACTRIM,SEPTRA)  Eye drops  Follow your surgeon's instructions on when to stop Aspirin.  If no instructions were given by your surgeon then you will need to call the office to get those instructions.     As of today, STOP taking any diclofenac Sodium (VOLTAREN), Aleve, Naproxen, Ibuprofen, Motrin, Advil, Goody's, BC's, all herbal medications, fish oil, and all vitamins.  WHAT DO I DO ABOUT MY DIABETES MEDICATION?   Do not take oral diabetes medicines (pills) the morning of surgery. DO NOT TAKE sitaGLIPtin (JANUVIA) the day of surgery.     HOW TO MANAGE YOUR DIABETES BEFORE AND AFTER SURGERY  Why is it important to control my blood sugar before and after surgery? Improving blood sugar levels before and after surgery helps healing and can limit problems. A way of improving blood sugar control  is eating a healthy diet by:  Eating less sugar and carbohydrates  Increasing activity/exercise  Talking with your doctor about reaching your blood sugar goals High blood sugars (greater than 180 mg/dL) can raise your risk of infections and slow your recovery, so you will need to focus on controlling your diabetes during the weeks before surgery. Make sure that the doctor who takes care of your diabetes knows about your planned surgery including the date and location.  How do I manage my blood sugar before surgery? Check your blood sugar at least 4 times a day, starting 2 days before surgery, to make sure that the level is not too high or low.  Check your blood sugar the morning of your surgery when you wake up and every 2 hours until you get to the Short Stay unit.  If your blood sugar is less than 70 mg/dL, you will need to treat for low blood sugar: Do not take insulin. Treat a low blood sugar (less than 70 mg/dL) with  cup of clear juice (cranberry or apple), 4 glucose tablets, OR glucose gel. Recheck blood sugar in 15 minutes after treatment (to make sure it is greater than 70 mg/dL). If your blood sugar is not greater than 70 mg/dL on recheck, call [phone] for further instructions. Report your blood sugar to the short stay nurse when you get to Short Stay.  If you are admitted to the hospital after surgery: Your blood sugar will be checked by the staff and you will probably be given insulin after surgery (instead of oral diabetes medicines) to  make sure you have good blood sugar levels. The goal for blood sugar control after surgery is 80-180 mg/dL.             [facility] is not responsible for any belongings or valuables.    Do NOT Smoke (Tobacco/Vaping)  24 hours prior to your procedure  If you use a CPAP at night, you may bring your mask for your overnight stay.   Contacts, glasses, hearing aids, dentures or partials may not be worn into surgery, please bring cases for  these belongings   For patients admitted to the hospital, discharge time will be determined by your treatment team.   Patients discharged the day of surgery will not be allowed to drive home, and someone needs to stay with them for 24 hours.   SURGICAL WAITING ROOM VISITATION Patients having surgery or a procedure may have no more than 2 support people in the waiting area - these visitors may rotate.   Children under the age of 4 must have an adult with them who is not the patient. If the patient needs to stay at the hospital during part of their recovery, the visitor guidelines for inpatient rooms apply. Pre-op nurse will coordinate an appropriate time for 1 support person to accompany patient in pre-op.  This support person may not rotate.   Please refer to [email or web address][employer]hu for the visitor guidelines for Inpatients (after your surgery is over and you are in a regular room).    Special instructions:    Oral Hygiene is also important to reduce your risk of infection.  Remember - BRUSH YOUR TEETH THE MORNING OF SURGERY WITH YOUR REGULAR TOOTHPASTE   [facility]- Preparing For Surgery  Before surgery, you can play an important role. Because skin is not sterile, your skin needs to be as free of germs as possible. You can reduce the number of germs on your skin by washing with CHG (chlorahexidine gluconate) Soap before surgery.  CHG is an antiseptic cleaner which kills germs and bonds with the skin to continue killing germs even after washing.     Please do not use if you have an allergy to CHG or antibacterial soaps. If your skin becomes reddened/irritated stop using the CHG.  Do not shave (including legs and underarms) for at least 48 hours prior to first CHG shower. It is OK to shave your face.  Please follow these instructions carefully.     Shower the NIGHT BEFORE SURGERY and the MORNING OF SURGERY with CHG Soap.   If you chose  to wash your hair, wash your hair first as usual with your normal shampoo. After you shampoo, rinse your hair and body thoroughly to remove the shampoo.  Then [employer] and genitals (private parts) with your normal soap and rinse thoroughly to remove soap.  After that Use CHG Soap as you would any other liquid soap. You can apply CHG directly to the skin and wash gently with a scrungie or a clean washcloth.   Apply the CHG Soap to your body ONLY FROM THE NECK DOWN.  Do not use on open wounds or open sores. Avoid contact with your eyes, ears, mouth and genitals (private parts). Wash Face and genitals (private parts)  with your normal soap.   Wash thoroughly, paying special attention to the area where your surgery will be performed.  Thoroughly rinse your body with warm water from the neck down.  DO NOT shower/wash with your normal soap after using and rinsing  off the CHG Soap.  Pat yourself dry with a CLEAN TOWEL.  Wear CLEAN PAJAMAS to bed the night before surgery  Place CLEAN SHEETS on your bed the night before your surgery  DO NOT SLEEP WITH PETS.   Day of Surgery:  Take a shower with CHG soap. Wear Clean/Comfortable clothing the morning of surgery Do not wear jewelry or makeup. Do not wear lotions, powders, perfumes/cologne or deodorant. Do not shave 48 hours prior to surgery.  Men may shave face and neck. Do not bring valuables to the hospital. Do not wear nail polish, gel polish, artificial nails, or any other type of covering on natural nails (fingers and toes) If you have artificial nails or gel coating that need to be removed by a nail salon, please have this removed prior to surgery. Artificial nails or gel coating may interfere with anesthesia's ability to adequately monitor your vital signs. Remember to brush your teeth WITH YOUR REGULAR TOOTHPASTE.    If you received a COVID test during your pre-op visit, it is requested that you wear a mask when out in public, stay  away from anyone that may not be feeling well, and notify your surgeon if you develop symptoms. If you have been in contact with anyone that has tested positive in the last 10 days, please notify your surgeon.    Please read over the following fact sheets that you were given.

## 2022-01-18 ENCOUNTER — Other Ambulatory Visit: Payer: Self-pay

## 2022-01-18 ENCOUNTER — Encounter (HOSPITAL_COMMUNITY): Payer: Self-pay

## 2022-01-18 ENCOUNTER — Encounter (HOSPITAL_COMMUNITY)
Admission: RE | Admit: 2022-01-18 | Discharge: 2022-01-18 | Disposition: A | Discharge status: Home / Self Care | Payer: PPO | Source: Ambulatory Visit | Attending: Neurological Surgery | Admitting: Neurological Surgery

## 2022-01-18 DIAGNOSIS — I129 Hypertensive chronic kidney disease with stage 1 through stage 4 chronic kidney disease, or unspecified chronic kidney disease: Secondary | ICD-10-CM | POA: Diagnosis present

## 2022-01-18 DIAGNOSIS — R29810 Facial weakness: Secondary | ICD-10-CM | POA: Diagnosis not present

## 2022-01-18 DIAGNOSIS — Z86018 Personal history of other benign neoplasm: Secondary | ICD-10-CM | POA: Diagnosis not present

## 2022-01-18 DIAGNOSIS — R059 Cough, unspecified: Secondary | ICD-10-CM | POA: Diagnosis not present

## 2022-01-18 DIAGNOSIS — Z01818 Encounter for other preprocedural examination: Secondary | ICD-10-CM

## 2022-01-18 DIAGNOSIS — D333 Benign neoplasm of cranial nerves: Secondary | ICD-10-CM | POA: Diagnosis present

## 2022-01-18 DIAGNOSIS — I341 Nonrheumatic mitral (valve) prolapse: Secondary | ICD-10-CM | POA: Diagnosis present

## 2022-01-18 DIAGNOSIS — Z940 Kidney transplant status: Secondary | ICD-10-CM | POA: Diagnosis not present

## 2022-01-18 DIAGNOSIS — I6782 Cerebral ischemia: Secondary | ICD-10-CM | POA: Diagnosis not present

## 2022-01-18 DIAGNOSIS — Z7984 Long term (current) use of oral hypoglycemic drugs: Secondary | ICD-10-CM | POA: Diagnosis not present

## 2022-01-18 DIAGNOSIS — R509 Fever, unspecified: Secondary | ICD-10-CM | POA: Diagnosis not present

## 2022-01-18 DIAGNOSIS — R296 Repeated falls: Secondary | ICD-10-CM | POA: Diagnosis present

## 2022-01-18 DIAGNOSIS — Z792 Long term (current) use of antibiotics: Secondary | ICD-10-CM | POA: Diagnosis not present

## 2022-01-18 DIAGNOSIS — E785 Hyperlipidemia, unspecified: Secondary | ICD-10-CM | POA: Diagnosis present

## 2022-01-18 DIAGNOSIS — D496 Neoplasm of unspecified behavior of brain: Secondary | ICD-10-CM | POA: Diagnosis not present

## 2022-01-18 DIAGNOSIS — G936 Cerebral edema: Secondary | ICD-10-CM | POA: Diagnosis not present

## 2022-01-18 DIAGNOSIS — E039 Hypothyroidism, unspecified: Secondary | ICD-10-CM | POA: Diagnosis present

## 2022-01-18 DIAGNOSIS — E1122 Type 2 diabetes mellitus with diabetic chronic kidney disease: Secondary | ICD-10-CM | POA: Diagnosis present

## 2022-01-18 DIAGNOSIS — Z79890 Hormone replacement therapy (postmenopausal): Secondary | ICD-10-CM | POA: Diagnosis not present

## 2022-01-18 DIAGNOSIS — G518 Other disorders of facial nerve: Secondary | ICD-10-CM | POA: Diagnosis not present

## 2022-01-18 DIAGNOSIS — R000 Tachycardia, unspecified: Secondary | ICD-10-CM | POA: Diagnosis not present

## 2022-01-18 DIAGNOSIS — R809 Proteinuria, unspecified: Secondary | ICD-10-CM | POA: Diagnosis not present

## 2022-01-18 DIAGNOSIS — Z48811 Encounter for surgical aftercare following surgery on the nervous system: Secondary | ICD-10-CM | POA: Diagnosis not present

## 2022-01-18 DIAGNOSIS — R270 Ataxia, unspecified: Secondary | ICD-10-CM | POA: Diagnosis present

## 2022-01-18 DIAGNOSIS — H9192 Unspecified hearing loss, left ear: Secondary | ICD-10-CM | POA: Diagnosis present

## 2022-01-18 DIAGNOSIS — N184 Chronic kidney disease, stage 4 (severe): Secondary | ICD-10-CM | POA: Diagnosis present

## 2022-01-18 DIAGNOSIS — E119 Type 2 diabetes mellitus without complications: Secondary | ICD-10-CM | POA: Diagnosis not present

## 2022-01-18 DIAGNOSIS — Z7969 Long term (current) use of other immunomodulators and immunosuppressants: Secondary | ICD-10-CM | POA: Diagnosis not present

## 2022-01-18 DIAGNOSIS — Z8249 Family history of ischemic heart disease and other diseases of the circulatory system: Secondary | ICD-10-CM | POA: Diagnosis not present

## 2022-01-18 DIAGNOSIS — I10 Essential (primary) hypertension: Secondary | ICD-10-CM | POA: Diagnosis not present

## 2022-01-18 DIAGNOSIS — M109 Gout, unspecified: Secondary | ICD-10-CM | POA: Diagnosis present

## 2022-01-18 DIAGNOSIS — H409 Unspecified glaucoma: Secondary | ICD-10-CM | POA: Diagnosis present

## 2022-01-18 DIAGNOSIS — G4700 Insomnia, unspecified: Secondary | ICD-10-CM | POA: Diagnosis not present

## 2022-01-18 DIAGNOSIS — Z7982 Long term (current) use of aspirin: Secondary | ICD-10-CM | POA: Diagnosis not present

## 2022-01-18 DIAGNOSIS — N289 Disorder of kidney and ureter, unspecified: Secondary | ICD-10-CM | POA: Diagnosis not present

## 2022-01-18 HISTORY — DX: Type 2 diabetes mellitus without complications: E11.9

## 2022-01-18 LAB — SURGICAL PCR SCREEN
MRSA, PCR: NEGATIVE
Staphylococcus aureus: NEGATIVE

## 2022-01-18 LAB — BASIC METABOLIC PANEL
Anion gap: 11 (ref 5–15)
BUN: 46 mg/dL — ABNORMAL HIGH (ref 8–23)
CO2: 24 mmol/L (ref 22–32)
Calcium: 8.6 mg/dL — ABNORMAL LOW (ref 8.9–10.3)
Chloride: 102 mmol/L (ref 98–111)
Creatinine, Ser: 2.6 mg/dL — ABNORMAL HIGH (ref 0.44–1.00)
GFR, Estimated: 19 mL/min — ABNORMAL LOW (ref >60)
Glucose, Bld: 143 mg/dL — ABNORMAL HIGH (ref 70–99)
Potassium: 4.3 mmol/L (ref 3.5–5.1)
Sodium: 137 mmol/L (ref 135–145)

## 2022-01-18 LAB — CBC
HCT: 32.4 % — ABNORMAL LOW (ref 36.0–46.0)
Hemoglobin: 10.4 g/dL — ABNORMAL LOW (ref 12.0–15.0)
MCH: 28.7 pg (ref 26.0–34.0)
MCHC: 32.1 g/dL (ref 30.0–36.0)
MCV: 89.3 fL (ref 80.0–100.0)
Platelets: 266 10*3/uL (ref 150–400)
RBC: 3.63 MIL/uL — ABNORMAL LOW (ref 3.87–5.11)
RDW: 13.2 % (ref 11.5–15.5)
WBC: 10.4 10*3/uL (ref 4.0–10.5)
nRBC: 0 % (ref 0.0–0.2)

## 2022-01-18 LAB — HEMOGLOBIN A1C
Hgb A1c MFr Bld: 6.8 % — ABNORMAL HIGH (ref 4.8–5.6)
Mean Plasma Glucose: 148.46 mg/dL

## 2022-01-18 LAB — GLUCOSE, CAPILLARY: Glucose-Capillary: 144 mg/dL — ABNORMAL HIGH (ref 70–99)

## 2022-01-18 NOTE — Anesthesia Preprocedure Evaluation (Signed)
Anesthesia Evaluation  Patient identified by MRN, date of birth, ID band Patient awake    Reviewed: Allergy & Precautions, H&P , NPO status , Patient's Chart, lab work & pertinent test results  Airway Mallampati: II  TM Distance: >3 FB Neck ROM: Full    Dental no notable dental hx. (+) Teeth Intact, Dental Advisory Given   Pulmonary neg pulmonary ROS   Pulmonary exam normal breath sounds clear to auscultation       Cardiovascular Exercise Tolerance: Good hypertension, Pt. on medications and Pt. on home beta blockers  Rhythm:Regular Rate:Normal     Neuro/Psych negative neurological ROS  negative psych ROS   GI/Hepatic negative GI ROS, Neg liver ROS,,,  Endo/Other  diabetes, Type 2Hypothyroidism    Renal/GU Renal InsufficiencyRenal disease  negative genitourinary   Musculoskeletal   Abdominal   Peds  Hematology negative hematology ROS (+)   Anesthesia Other Findings   Reproductive/Obstetrics negative OB ROS                             Anesthesia Physical Anesthesia Plan  ASA: 3  Anesthesia Plan: General   Post-op Pain Management: Tylenol PO (pre-op)*   Induction: Intravenous  PONV Risk Score and Plan: 4 or greater and Ondansetron, Dexamethasone and Treatment may vary due to age or medical condition  Airway Management Planned: Oral ETT  Additional Equipment: Arterial line  Intra-op Plan:   Post-operative Plan: Extubation in OR  Informed Consent: I have reviewed the patients History and Physical, chart, labs and discussed the procedure including the risks, benefits and alternatives for the proposed anesthesia with the patient or authorized representative who has indicated his/her understanding and acceptance.     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  She is currently maintained on Prograf 2.5 mg twice daily and Myfortic 360 mg twice daily and prophylactic Bactrim Monday Wednesday Friday.  She was last seen 11/13/2021 and noted to be stable at that time.  Baseline creatinine 1.7-2.0 (2.02 at time of that visit).  No changes made at that time, 1 year follow-up recommended.  She is also followed by her primary nephrologist Dr. Quintella Baton.  Patient had a benign pre-transplant cardiac elevation in 2011.  Subsequently had a stress echo 2013 for evaluation of abnormal EKG which was normal.  Patient recently evaluated neurology for dizziness and multiple falls.  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  Non-insulin-dependent DM2, A1c 6.8 on preop labs.  Preop labs reviewed, creatinine elevated somewhat above baseline at 2.60, mild anemia with hemoglobin 10.4, otherwise unremarkable.  EKG 01/18/2022: Normal sinus rhythm.  Rate 71. Right atrial enlargement. Nonspecific ST and T wave abnormality )        Anesthesia Quick Evaluation

## 2022-01-18 NOTE — Progress Notes (Signed)
PCP - Michelle Rubio Cardiologist - denies Nephrologist: Quintella Baton  PPM/ICD - denies   Chest x-ray -  EKG - 01/18/22 Stress Test - 12/20/11 ECHO - 12/19/11 Cardiac Cath - denies  Sleep Study - denies  Does not check CBG at home  Follow your surgeon's instructions on when to stop Aspirin.  If no instructions were given by your surgeon then you will need to call the office to get those instructions.       As of today, STOP taking any diclofenac Sodium (VOLTAREN), Aleve, Naproxen, Ibuprofen, Motrin, Advil, Goody's, BC's, all herbal medications, fish oil, and all vitamins.  ERAS Protcol -yes PRE-SURGERY Ensure or G2- not ordered  COVID TEST- not needed   Anesthesia review: yes, cardiac history  Patient denies shortness of breath, fever, cough and chest pain at PAT appointment   All instructions explained to the patient, with a verbal understanding of the material. Patient agrees to go over the instructions while at home for a better understanding. Patient also instructed to self quarantine after being tested for COVID-19. The opportunity to ask questions was provided.

## 2022-01-18 NOTE — Progress Notes (Signed)
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  She is currently maintained on Prograf 2.5 mg twice daily and Myfortic 360 mg twice daily and prophylactic Bactrim Monday Wednesday Friday.  She was last seen 11/13/2021 and noted to be stable at that time.  Baseline creatinine 1.7-2.0 (2.02 at time of that visit).  No changes made at that time, 1 year follow-up recommended.  She is also followed by her primary nephrologist Dr. Quintella Baton.  Patient had a benign pre-transplant cardiac elevation in 2011.  Subsequently had a stress echo 2013 for evaluation of abnormal EKG which was normal.  Patient recently evaluated neurology for dizziness and multiple falls.  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  Non-insulin-dependent DM2, A1c 6.8 on preop labs.  Preop labs reviewed, creatinine elevated somewhat above baseline at 2.60, mild anemia with hemoglobin 10.4, otherwise unremarkable.  EKG 01/18/2022: Normal sinus rhythm.  Rate 71. Right atrial enlargement. Nonspecific ST and T wave abnormality    Wynonia Musty [facility] Short Stay Center/Anesthesiology Phone [phone] 01/18/2022 4:32 PM

## 2022-01-21 ENCOUNTER — Inpatient Hospital Stay (HOSPITAL_COMMUNITY): Payer: PPO | Admitting: Certified Registered Nurse Anesthetist

## 2022-01-21 ENCOUNTER — Other Ambulatory Visit: Payer: Self-pay

## 2022-01-21 ENCOUNTER — Inpatient Hospital Stay (HOSPITAL_COMMUNITY): Payer: PPO

## 2022-01-21 ENCOUNTER — Inpatient Hospital Stay (HOSPITAL_COMMUNITY)
Admission: RE | Admit: 2022-01-21 | Discharge: 2022-01-23 | DRG: 026 | Disposition: A | Discharge status: Rehabilitation Facility | Payer: PPO | Source: Ambulatory Visit | Attending: Neurological Surgery | Admitting: Neurological Surgery

## 2022-01-21 ENCOUNTER — Encounter (HOSPITAL_COMMUNITY)
Admission: RE | Disposition: A | Discharge status: Rehabilitation Facility | Payer: Self-pay | Source: Ambulatory Visit | Attending: Neurological Surgery

## 2022-01-21 DIAGNOSIS — Z940 Kidney transplant status: Secondary | ICD-10-CM

## 2022-01-21 DIAGNOSIS — Z792 Long term (current) use of antibiotics: Secondary | ICD-10-CM

## 2022-01-21 DIAGNOSIS — Z8249 Family history of ischemic heart disease and other diseases of the circulatory system: Secondary | ICD-10-CM | POA: Diagnosis not present

## 2022-01-21 DIAGNOSIS — I341 Nonrheumatic mitral (valve) prolapse: Secondary | ICD-10-CM | POA: Diagnosis present

## 2022-01-21 DIAGNOSIS — N289 Disorder of kidney and ureter, unspecified: Secondary | ICD-10-CM

## 2022-01-21 DIAGNOSIS — D333 Benign neoplasm of cranial nerves: Secondary | ICD-10-CM | POA: Diagnosis present

## 2022-01-21 DIAGNOSIS — R296 Repeated falls: Secondary | ICD-10-CM | POA: Diagnosis present

## 2022-01-21 DIAGNOSIS — Z7969 Long term (current) use of other immunomodulators and immunosuppressants: Secondary | ICD-10-CM

## 2022-01-21 DIAGNOSIS — E039 Hypothyroidism, unspecified: Secondary | ICD-10-CM | POA: Diagnosis present

## 2022-01-21 DIAGNOSIS — M109 Gout, unspecified: Secondary | ICD-10-CM | POA: Diagnosis present

## 2022-01-21 DIAGNOSIS — E785 Hyperlipidemia, unspecified: Secondary | ICD-10-CM | POA: Diagnosis present

## 2022-01-21 DIAGNOSIS — E1122 Type 2 diabetes mellitus with diabetic chronic kidney disease: Secondary | ICD-10-CM | POA: Diagnosis present

## 2022-01-21 DIAGNOSIS — H409 Unspecified glaucoma: Secondary | ICD-10-CM | POA: Diagnosis present

## 2022-01-21 DIAGNOSIS — H9192 Unspecified hearing loss, left ear: Secondary | ICD-10-CM | POA: Diagnosis present

## 2022-01-21 DIAGNOSIS — Z01818 Encounter for other preprocedural examination: Secondary | ICD-10-CM

## 2022-01-21 DIAGNOSIS — R29810 Facial weakness: Secondary | ICD-10-CM | POA: Diagnosis not present

## 2022-01-21 DIAGNOSIS — I10 Essential (primary) hypertension: Secondary | ICD-10-CM | POA: Diagnosis not present

## 2022-01-21 DIAGNOSIS — R270 Ataxia, unspecified: Secondary | ICD-10-CM | POA: Diagnosis present

## 2022-01-21 DIAGNOSIS — G4700 Insomnia, unspecified: Secondary | ICD-10-CM | POA: Diagnosis not present

## 2022-01-21 DIAGNOSIS — N184 Chronic kidney disease, stage 4 (severe): Secondary | ICD-10-CM | POA: Diagnosis present

## 2022-01-21 DIAGNOSIS — I129 Hypertensive chronic kidney disease with stage 1 through stage 4 chronic kidney disease, or unspecified chronic kidney disease: Secondary | ICD-10-CM | POA: Diagnosis present

## 2022-01-21 DIAGNOSIS — Z9889 Other specified postprocedural states: Principal | ICD-10-CM

## 2022-01-21 DIAGNOSIS — Z48811 Encounter for surgical aftercare following surgery on the nervous system: Secondary | ICD-10-CM | POA: Diagnosis not present

## 2022-01-21 HISTORY — PX: CRANIOTOMY: SHX93

## 2022-01-21 LAB — GLUCOSE, CAPILLARY
Glucose-Capillary: 133 mg/dL — ABNORMAL HIGH (ref 70–99)
Glucose-Capillary: 167 mg/dL — ABNORMAL HIGH (ref 70–99)

## 2022-01-21 LAB — POCT I-STAT 7, (LYTES, BLD GAS, ICA,H+H)
Acid-base deficit: 2 mmol/L (ref 0.0–2.0)
Bicarbonate: 22.3 mmol/L (ref 20.0–28.0)
Calcium, Ion: 1.14 mmol/L — ABNORMAL LOW (ref 1.15–1.40)
HCT: 24 % — ABNORMAL LOW (ref 36.0–46.0)
Hemoglobin: 8.2 g/dL — ABNORMAL LOW (ref 12.0–15.0)
O2 Saturation: 100 %
Patient temperature: 35.6
Potassium: 4 mmol/L (ref 3.5–5.1)
Sodium: 139 mmol/L (ref 135–145)
TCO2: 23 mmol/L (ref 22–32)
pCO2 arterial: 33.2 mmHg (ref 32–48)
pH, Arterial: 7.429 (ref 7.35–7.45)
pO2, Arterial: 335 mmHg — ABNORMAL HIGH (ref 83–108)

## 2022-01-21 LAB — ABO/RH: ABO/RH(D): O POS

## 2022-01-21 LAB — PREPARE RBC (CROSSMATCH)

## 2022-01-21 SURGERY — CRANIOTOMY TUMOR EXCISION
Anesthesia: General | Site: Head | Laterality: Left

## 2022-01-21 MED ORDER — ORAL CARE MOUTH RINSE: 15.0000 mL | Freq: Once | OROMUCOSAL | Status: AC

## 2022-01-21 MED ORDER — LABETALOL HCL 5 MG/ML IV SOLN
10.0000 mg | INTRAVENOUS | Status: DC | PRN
  Administered 2022-01-22 (×2): 20 mg via INTRAVENOUS
  Filled 2022-01-21 (×3): qty 4

## 2022-01-21 MED ORDER — HYDROMORPHONE HCL 1 MG/ML IJ SOLN
INTRAMUSCULAR | Status: AC
  Filled 2022-01-21: qty 1

## 2022-01-21 MED ORDER — NETARSUDIL-LATANOPROST 0.02-0.005 % OP SOLN: 1.0000 [drp] | Freq: Every day | OPHTHALMIC | Status: DC

## 2022-01-21 MED ORDER — PHENYLEPHRINE HCL-NACL 20-0.9 MG/250ML-% IV SOLN
INTRAVENOUS | Status: AC
  Filled 2022-01-21: qty 250

## 2022-01-21 MED ORDER — BACITRACIN ZINC 500 UNIT/GM EX OINT
TOPICAL_OINTMENT | CUTANEOUS | Status: AC
  Filled 2022-01-21: qty 28.35

## 2022-01-21 MED ORDER — ACETAMINOPHEN 500 MG PO TABS
1000.0000 mg | ORAL_TABLET | Freq: Once | ORAL | Status: AC
  Administered 2022-01-21: 1000 mg via ORAL
  Filled 2022-01-21: qty 2

## 2022-01-21 MED ORDER — DEXAMETHASONE SODIUM PHOSPHATE 10 MG/ML IJ SOLN
INTRAMUSCULAR | Status: DC | PRN
  Administered 2022-01-21: 10 mg via INTRAVENOUS

## 2022-01-21 MED ORDER — ONDANSETRON HCL 4 MG/2ML IJ SOLN: 4.0000 mg | INTRAMUSCULAR | Status: DC | PRN

## 2022-01-21 MED ORDER — INSULIN ASPART 100 UNIT/ML IJ SOLN: 0.0000 [IU] | INTRAMUSCULAR | Status: DC | PRN

## 2022-01-21 MED ORDER — CHLORHEXIDINE GLUCONATE CLOTH 2 % EX PADS: 6.0000 | MEDICATED_PAD | Freq: Once | CUTANEOUS | Status: DC

## 2022-01-21 MED ORDER — TACROLIMUS 1 MG PO CAPS
2.0000 mg | ORAL_CAPSULE | Freq: Two times a day (BID) | ORAL | Status: DC
  Administered 2022-01-21 – 2022-01-23 (×4): 2 mg via ORAL
  Filled 2022-01-21 (×6): qty 2

## 2022-01-21 MED ORDER — DEXAMETHASONE SODIUM PHOSPHATE 10 MG/ML IJ SOLN
INTRAMUSCULAR | Status: AC
  Filled 2022-01-21: qty 1

## 2022-01-21 MED ORDER — PHENYLEPHRINE 80 MCG/ML (10ML) SYRINGE FOR IV PUSH (FOR BLOOD PRESSURE SUPPORT)
PREFILLED_SYRINGE | INTRAVENOUS | Status: DC | PRN
  Administered 2022-01-21 (×2): 80 ug via INTRAVENOUS

## 2022-01-21 MED ORDER — PROPOFOL 10 MG/ML IV BOLUS
INTRAVENOUS | Status: AC
  Filled 2022-01-21: qty 20

## 2022-01-21 MED ORDER — CLEVIDIPINE BUTYRATE 0.5 MG/ML IV EMUL
INTRAVENOUS | Status: AC
  Filled 2022-01-21: qty 50

## 2022-01-21 MED ORDER — LABETALOL HCL 5 MG/ML IV SOLN
INTRAVENOUS | Status: AC
  Filled 2022-01-21: qty 4

## 2022-01-21 MED ORDER — ACETAMINOPHEN 325 MG PO TABS
650.0000 mg | ORAL_TABLET | ORAL | Status: DC | PRN
  Administered 2022-01-21 – 2022-01-23 (×4): 650 mg via ORAL
  Filled 2022-01-21 (×5): qty 2

## 2022-01-21 MED ORDER — LIDOCAINE 2% (20 MG/ML) 5 ML SYRINGE
INTRAMUSCULAR | Status: DC | PRN
  Administered 2022-01-21: 60 mg via INTRAVENOUS

## 2022-01-21 MED ORDER — THROMBIN 20000 UNITS EX SOLR
CUTANEOUS | Status: AC
  Filled 2022-01-21: qty 20000

## 2022-01-21 MED ORDER — ESMOLOL HCL 100 MG/10ML IV SOLN
INTRAVENOUS | Status: DC | PRN
  Administered 2022-01-21: 20 mg via INTRAVENOUS
  Administered 2022-01-21: 30 mg via INTRAVENOUS
  Administered 2022-01-21: 20 mg via INTRAVENOUS

## 2022-01-21 MED ORDER — SUGAMMADEX SODIUM 200 MG/2ML IV SOLN
INTRAVENOUS | Status: DC | PRN
  Administered 2022-01-21: 200 mg via INTRAVENOUS

## 2022-01-21 MED ORDER — SODIUM CHLORIDE 0.9 % IV SOLN
0.1500 ug/kg/min | INTRAVENOUS | Status: AC
  Administered 2022-01-21 (×2): .2 ug/kg/min via INTRAVENOUS
  Filled 2022-01-21: qty 2000

## 2022-01-21 MED ORDER — SUCCINYLCHOLINE CHLORIDE 200 MG/10ML IV SOSY
PREFILLED_SYRINGE | INTRAVENOUS | Status: AC
  Filled 2022-01-21: qty 10

## 2022-01-21 MED ORDER — HYDROCODONE-ACETAMINOPHEN 5-325 MG PO TABS
1.0000 | ORAL_TABLET | ORAL | Status: DC | PRN
  Filled 2022-01-21: qty 1

## 2022-01-21 MED ORDER — GLYCOPYRROLATE PF 0.2 MG/ML IJ SOSY
PREFILLED_SYRINGE | INTRAMUSCULAR | Status: DC | PRN
  Administered 2022-01-21: .2 mg via INTRAVENOUS

## 2022-01-21 MED ORDER — THROMBIN 20000 UNITS EX SOLR: CUTANEOUS | Status: DC | PRN

## 2022-01-21 MED ORDER — FENTANYL CITRATE (PF) 250 MCG/5ML IJ SOLN
INTRAMUSCULAR | Status: AC
  Filled 2022-01-21: qty 5

## 2022-01-21 MED ORDER — CLEVIDIPINE BUTYRATE 0.5 MG/ML IV EMUL
0.0000 mg/h | INTRAVENOUS | Status: DC
  Administered 2022-01-21: 5 mg/h via INTRAVENOUS
  Administered 2022-01-21: 2 mg/h via INTRAVENOUS
  Filled 2022-01-21: qty 50
  Filled 2022-01-21 (×2): qty 100

## 2022-01-21 MED ORDER — SODIUM CHLORIDE 0.9 % IV SOLN
0.1500 ug/kg/min | INTRAVENOUS | Status: DC
  Filled 2022-01-21: qty 2000

## 2022-01-21 MED ORDER — ACETAMINOPHEN 650 MG RE SUPP: 650.0000 mg | RECTAL | Status: DC | PRN

## 2022-01-21 MED ORDER — MYCOPHENOLATE SODIUM 180 MG PO TBEC
360.0000 mg | DELAYED_RELEASE_TABLET | Freq: Two times a day (BID) | ORAL | Status: DC
  Administered 2022-01-21 – 2022-01-23 (×4): 360 mg via ORAL
  Filled 2022-01-21 (×6): qty 2

## 2022-01-21 MED ORDER — EPHEDRINE SULFATE-NACL 50-0.9 MG/10ML-% IV SOSY
PREFILLED_SYRINGE | INTRAVENOUS | Status: DC | PRN
  Administered 2022-01-21: 2.5 mg via INTRAVENOUS
  Administered 2022-01-21: 5 mg via INTRAVENOUS

## 2022-01-21 MED ORDER — ONDANSETRON HCL 4 MG/2ML IJ SOLN
INTRAMUSCULAR | Status: AC
  Filled 2022-01-21: qty 2

## 2022-01-21 MED ORDER — LIDOCAINE-EPINEPHRINE 1 %-1:100000 IJ SOLN
INTRAMUSCULAR | Status: DC | PRN
  Administered 2022-01-21: 10 mL

## 2022-01-21 MED ORDER — SODIUM CHLORIDE 0.9 % IV SOLN: INTRAVENOUS | Status: DC

## 2022-01-21 MED ORDER — METOPROLOL TARTRATE 12.5 MG HALF TABLET
12.5000 mg | ORAL_TABLET | Freq: Two times a day (BID) | ORAL | Status: DC
  Administered 2022-01-21 – 2022-01-23 (×4): 12.5 mg via ORAL
  Filled 2022-01-21 (×6): qty 1

## 2022-01-21 MED ORDER — ONDANSETRON HCL 4 MG PO TABS: 4.0000 mg | ORAL_TABLET | ORAL | Status: DC | PRN

## 2022-01-21 MED ORDER — EZETIMIBE 10 MG PO TABS
10.0000 mg | ORAL_TABLET | Freq: Every day | ORAL | Status: DC
  Administered 2022-01-22 – 2022-01-23 (×2): 10 mg via ORAL
  Filled 2022-01-21 (×2): qty 1

## 2022-01-21 MED ORDER — CEFAZOLIN SODIUM-DEXTROSE 2-4 GM/100ML-% IV SOLN
2.0000 g | Freq: Three times a day (TID) | INTRAVENOUS | Status: AC
  Administered 2022-01-21 – 2022-01-22 (×2): 2 g via INTRAVENOUS
  Filled 2022-01-21 (×2): qty 100

## 2022-01-21 MED ORDER — DOCUSATE SODIUM 100 MG PO CAPS
100.0000 mg | ORAL_CAPSULE | Freq: Two times a day (BID) | ORAL | Status: DC
  Administered 2022-01-21 – 2022-01-23 (×4): 100 mg via ORAL
  Filled 2022-01-21 (×4): qty 1

## 2022-01-21 MED ORDER — SODIUM CHLORIDE 0.9 % IV SOLN: 10.0000 mL/h | Freq: Once | INTRAVENOUS | Status: AC

## 2022-01-21 MED ORDER — BRIMONIDINE TARTRATE 0.2 % OP SOLN
1.0000 [drp] | Freq: Two times a day (BID) | OPHTHALMIC | Status: DC
  Administered 2022-01-21 – 2022-01-23 (×4): 1 [drp] via OPHTHALMIC
  Filled 2022-01-21 (×2): qty 5

## 2022-01-21 MED ORDER — LIDOCAINE 2% (20 MG/ML) 5 ML SYRINGE
INTRAMUSCULAR | Status: AC
  Filled 2022-01-21: qty 5

## 2022-01-21 MED ORDER — HYDROMORPHONE HCL 1 MG/ML IJ SOLN
0.2500 mg | INTRAMUSCULAR | Status: DC | PRN
  Administered 2022-01-21 (×4): 0.5 mg via INTRAVENOUS

## 2022-01-21 MED ORDER — LIDOCAINE-EPINEPHRINE 1 %-1:100000 IJ SOLN
INTRAMUSCULAR | Status: AC
  Filled 2022-01-21: qty 1

## 2022-01-21 MED ORDER — HEMOSTATIC AGENTS (NO CHARGE) OPTIME
TOPICAL | Status: DC | PRN
  Administered 2022-01-21: 1 via TOPICAL

## 2022-01-21 MED ORDER — FENTANYL CITRATE (PF) 250 MCG/5ML IJ SOLN
INTRAMUSCULAR | Status: DC | PRN
  Administered 2022-01-21 (×2): 50 ug via INTRAVENOUS

## 2022-01-21 MED ORDER — FAMOTIDINE IN NACL 20-0.9 MG/50ML-% IV SOLN
20.0000 mg | INTRAVENOUS | Status: DC
  Administered 2022-01-21: 20 mg via INTRAVENOUS
  Filled 2022-01-21 (×2): qty 50

## 2022-01-21 MED ORDER — PHENYLEPHRINE 80 MCG/ML (10ML) SYRINGE FOR IV PUSH (FOR BLOOD PRESSURE SUPPORT)
PREFILLED_SYRINGE | INTRAVENOUS | Status: AC
  Filled 2022-01-21: qty 10

## 2022-01-21 MED ORDER — BEMPEDOIC ACID 180 MG PO TABS: 180.0000 mg | ORAL_TABLET | Freq: Every day | ORAL | Status: DC

## 2022-01-21 MED ORDER — 0.9 % SODIUM CHLORIDE (POUR BTL) OPTIME
TOPICAL | Status: DC | PRN
  Administered 2022-01-21: 3000 mL

## 2022-01-21 MED ORDER — LEVOTHYROXINE SODIUM 75 MCG PO TABS
75.0000 ug | ORAL_TABLET | Freq: Every day | ORAL | Status: DC
  Administered 2022-01-22 – 2022-01-23 (×2): 75 ug via ORAL
  Filled 2022-01-21 (×3): qty 1

## 2022-01-21 MED ORDER — HEPARIN SODIUM (PORCINE) 5000 UNIT/ML IJ SOLN
5000.0000 [IU] | Freq: Three times a day (TID) | INTRAMUSCULAR | Status: DC
  Administered 2022-01-23 (×2): 5000 [IU] via SUBCUTANEOUS
  Filled 2022-01-21 (×2): qty 1

## 2022-01-21 MED ORDER — LABETALOL HCL 5 MG/ML IV SOLN
10.0000 mg | INTRAVENOUS | Status: AC | PRN
  Administered 2022-01-21 (×2): 10 mg via INTRAVENOUS

## 2022-01-21 MED ORDER — POLYETHYLENE GLYCOL 3350 17 G PO PACK: 17.0000 g | PACK | Freq: Every day | ORAL | Status: DC | PRN

## 2022-01-21 MED ORDER — LABETALOL HCL 5 MG/ML IV SOLN
10.0000 mg | Freq: Once | INTRAVENOUS | Status: AC
  Administered 2022-01-21: 10 mg via INTRAVENOUS

## 2022-01-21 MED ORDER — ROCURONIUM BROMIDE 10 MG/ML (PF) SYRINGE
PREFILLED_SYRINGE | INTRAVENOUS | Status: DC | PRN
  Administered 2022-01-21: 40 mg via INTRAVENOUS

## 2022-01-21 MED ORDER — BACITRACIN ZINC 500 UNIT/GM EX OINT
TOPICAL_OINTMENT | CUTANEOUS | Status: DC | PRN
  Administered 2022-01-21: 1 via TOPICAL

## 2022-01-21 MED ORDER — CEFAZOLIN SODIUM-DEXTROSE 2-4 GM/100ML-% IV SOLN
2.0000 g | INTRAVENOUS | Status: AC
  Administered 2022-01-21: 2 g via INTRAVENOUS
  Filled 2022-01-21: qty 100

## 2022-01-21 MED ORDER — LINAGLIPTIN 5 MG PO TABS
5.0000 mg | ORAL_TABLET | Freq: Every day | ORAL | Status: DC
  Administered 2022-01-21 – 2022-01-23 (×3): 5 mg via ORAL
  Filled 2022-01-21 (×3): qty 1

## 2022-01-21 MED ORDER — THROMBIN 5000 UNITS EX SOLR
CUTANEOUS | Status: AC
  Filled 2022-01-21: qty 5000

## 2022-01-21 MED ORDER — SULFAMETHOXAZOLE-TRIMETHOPRIM 400-80 MG PO TABS
1.0000 | ORAL_TABLET | ORAL | Status: DC
  Administered 2022-01-21 – 2022-01-23 (×2): 1 via ORAL
  Filled 2022-01-21 (×3): qty 1

## 2022-01-21 MED ORDER — CHLORHEXIDINE GLUCONATE 0.12 % MT SOLN
15.0000 mL | Freq: Once | OROMUCOSAL | Status: AC
  Administered 2022-01-21: 15 mL via OROMUCOSAL
  Filled 2022-01-21: qty 15

## 2022-01-21 MED ORDER — DICLOFENAC SODIUM 1 % EX GEL: 2.0000 g | Freq: Every day | CUTANEOUS | Status: DC | PRN

## 2022-01-21 MED ORDER — ROCURONIUM BROMIDE 10 MG/ML (PF) SYRINGE
PREFILLED_SYRINGE | INTRAVENOUS | Status: AC
  Filled 2022-01-21: qty 10

## 2022-01-21 MED ORDER — PROPOFOL 10 MG/ML IV BOLUS
INTRAVENOUS | Status: DC | PRN
  Administered 2022-01-21: 90 mg via INTRAVENOUS
  Administered 2022-01-21: 40 mg via INTRAVENOUS

## 2022-01-21 MED ORDER — SODIUM CHLORIDE 0.9 % IV SOLN: INTRAVENOUS | Status: DC | PRN

## 2022-01-21 MED ORDER — THROMBIN 5000 UNITS EX SOLR: OROMUCOSAL | Status: DC | PRN

## 2022-01-21 MED ORDER — HYDROMORPHONE HCL 1 MG/ML IJ SOLN
0.5000 mg | INTRAMUSCULAR | Status: DC | PRN
  Administered 2022-01-21: 0.5 mg via INTRAVENOUS
  Filled 2022-01-21: qty 1

## 2022-01-21 MED ORDER — PROMETHAZINE HCL 25 MG PO TABS
12.5000 mg | ORAL_TABLET | ORAL | Status: DC | PRN
  Administered 2022-01-21: 25 mg via ORAL
  Filled 2022-01-21: qty 1

## 2022-01-21 MED ORDER — PHENYLEPHRINE HCL-NACL 20-0.9 MG/250ML-% IV SOLN
INTRAVENOUS | Status: DC | PRN
  Administered 2022-01-21: 40 ug/min via INTRAVENOUS

## 2022-01-21 MED ORDER — ONDANSETRON HCL 4 MG/2ML IJ SOLN
INTRAMUSCULAR | Status: DC | PRN
  Administered 2022-01-21: 4 mg via INTRAVENOUS

## 2022-01-21 MED ORDER — CHLORHEXIDINE GLUCONATE CLOTH 2 % EX PADS
6.0000 | MEDICATED_PAD | Freq: Every day | CUTANEOUS | Status: DC
  Administered 2022-01-21: 6 via TOPICAL

## 2022-01-21 SURGICAL SUPPLY — 60 items
ADH SKN CLS APL DERMABOND .7 (GAUZE/BANDAGES/DRESSINGS) ×1
BAG COUNTER SPONGE SURGICOUNT (BAG) ×1 IMPLANT
BAG SPNG CNTER NS LX DISP (BAG) ×2
BAND INSRT 18 STRL LF DISP RB (MISCELLANEOUS) ×2
BAND RUBBER #18 3X1/16 STRL (MISCELLANEOUS) IMPLANT
BLADE CLIPPER SURG (BLADE) ×1 IMPLANT
BUR ACORN 9.0 PRECISION (BURR) ×1 IMPLANT
BUR SABER DIAMOND 2.5 (BURR) IMPLANT
BUR SPIRAL ROUTER 2.3 (BUR) ×1 IMPLANT
CANISTER SUCT 3000ML PPV (MISCELLANEOUS) ×2 IMPLANT
CASSETTE SUCT IRRIG SONOPET IQ (MISCELLANEOUS) IMPLANT
CNTNR URN SCR LID CUP LEK RST (MISCELLANEOUS) ×1 IMPLANT
CONT SPEC 4OZ STRL OR WHT (MISCELLANEOUS) ×1
DERMABOND ADVANCED .7 DNX12 (GAUZE/BANDAGES/DRESSINGS) IMPLANT
DRAPE HALF SHEET 40X57 (DRAPES) ×1 IMPLANT
DRAPE MICROSCOPE LEICA (MISCELLANEOUS) IMPLANT
DRAPE NEUROLOGICAL W/INCISE (DRAPES) ×1 IMPLANT
DRAPE WARM FLUID 44X44 (DRAPES) ×1 IMPLANT
DURAPREP 6ML APPLICATOR 50/CS (WOUND CARE) ×1 IMPLANT
ELECT REM PT RETURN 9FT ADLT (ELECTROSURGICAL) ×1
ELECTRODE REM PT RTRN 9FT ADLT (ELECTROSURGICAL) ×1 IMPLANT
FEE INTRAOP CADWELL SUPPLY NCS (MISCELLANEOUS) IMPLANT
FEE INTRAOP MONITOR IMPULS NCS (MISCELLANEOUS) IMPLANT
FORCEPS BIPOLAR SPETZLER 8 1.0 (NEUROSURGERY SUPPLIES) ×1 IMPLANT
GLOVE BIO SURGEON STRL SZ7 (GLOVE) IMPLANT
GLOVE BIOGEL PI IND STRL 7.5 (GLOVE) ×1 IMPLANT
GLOVE ECLIPSE 7.5 STRL STRAW (GLOVE) ×1 IMPLANT
GOWN STRL REUS W/ TWL LRG LVL3 (GOWN DISPOSABLE) ×2 IMPLANT
GOWN STRL REUS W/ TWL XL LVL3 (GOWN DISPOSABLE) IMPLANT
GOWN STRL REUS W/TWL LRG LVL3 (GOWN DISPOSABLE) ×2
GOWN STRL REUS W/TWL XL LVL3 (GOWN DISPOSABLE) ×3
HEMOSTAT POWDER KIT SURGIFOAM (HEMOSTASIS) ×1 IMPLANT
HEMOSTAT SURGICEL 2X14 (HEMOSTASIS) ×1 IMPLANT
INTRAOP CADWELL SUPPLY FEE NCS (MISCELLANEOUS) ×1
INTRAOP DISP SUPPLY FEE NCS (MISCELLANEOUS) ×1
INTRAOP MONITOR FEE IMPULS NCS (MISCELLANEOUS) ×1
INTRAOP MONITOR FEE IMPULSE (MISCELLANEOUS) ×1
IV NS 1000ML (IV SOLUTION) ×1
IV NS 1000ML BAXH (IV SOLUTION) ×1 IMPLANT
KIT BASIN OR (CUSTOM PROCEDURE TRAY) ×1 IMPLANT
KIT TURNOVER KIT B (KITS) ×1 IMPLANT
NEEDLE HYPO 22GX1.5 SAFETY (NEEDLE) ×1 IMPLANT
NS IRRIG 1000ML POUR BTL (IV SOLUTION) ×3 IMPLANT
PACK CRANIOTOMY CUSTOM (CUSTOM PROCEDURE TRAY) ×1 IMPLANT
PATTIES SURGICAL .5 X.5 (GAUZE/BANDAGES/DRESSINGS) IMPLANT
PIN MAYFIELD SKULL DISP (PIN) ×1 IMPLANT
PLATE MALL UNIV 0.3 (Plate) IMPLANT
PROBE NERVBE PRASS .33 (MISCELLANEOUS) IMPLANT
SCREW UNIII AXS SD 1.5X4 (Screw) IMPLANT
SPONGE SURGIFOAM ABS GEL 100 (HEMOSTASIS) ×1 IMPLANT
STAPLER VISISTAT 35W (STAPLE) ×1 IMPLANT
SUT MNCRL AB 3-0 PS2 18 (SUTURE) IMPLANT
SUT NURALON 4 0 TR CR/8 (SUTURE) ×3 IMPLANT
SUT VIC AB 2-0 CP2 18 (SUTURE) ×1 IMPLANT
TIP TISSUE SONOPET IQ STD 12 (TIP) IMPLANT
TOWEL GREEN STERILE (TOWEL DISPOSABLE) ×1 IMPLANT
TOWEL GREEN STERILE FF (TOWEL DISPOSABLE) ×1 IMPLANT
TRAY FOLEY MTR SLVR 16FR STAT (SET/KITS/TRAYS/PACK) ×1 IMPLANT
UNDERPAD 30X36 HEAVY ABSORB (UNDERPADS AND DIAPERS) ×1 IMPLANT
WATER STERILE IRR 1000ML POUR (IV SOLUTION) ×1 IMPLANT

## 2022-01-21 NOTE — H&P (Signed)
Surgical H&P Update  HPI: 71 y.o. with a history of CKD, transplant, p/w ataxia and hearing loss. 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 No changes in health since they were last seen. Still having the above and wishes to proceed with surgery.  PMHx:  Past Medical History:  Diagnosis Date   Acute renal failure on dialysis ([place])    Body mass index (BMI) 20.0-20.9, adult    Chronic kidney disease    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   Diabetes mellitus without complication ([place])    type 2   Glaucoma    Gout    H/O Graves' disease 1980s   Hyperlipidemia    Hypertension    Hypothyroidism    Mitral valve prolapse    FamHx:  Family History  Problem Relation Age of Onset   Coronary artery disease Mother    Heart disease Father        CHF   SocHx:  reports that she has never smoked. She does not have any smokeless tobacco history on file. She reports that she does not drink alcohol and does not use drugs.  Physical Exam: Strength 5/5 x4 and SILTx4, FS & SS, Aox3, decreased hearing on the left  Assesment/Plan: 71 y.o. 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 Risks, benefits, and alternatives discussed and the patient would like to continue with surgery.  -OR today -4N ICU post-op  Judith Part, MD 01/21/22 7:41 AM

## 2022-01-21 NOTE — Anesthesia Procedure Notes (Addendum)
Arterial Line Insertion Start/End10/23/2023 7:00 AM, 01/21/2022 7:20 AM Performed by: Dorann Lodge, CRNA, CRNA  Patient location: Pre-op. Preanesthetic checklist: patient identified, IV checked, site marked, risks and benefits discussed, surgical consent, monitors and equipment checked, pre-op evaluation, timeout performed and anesthesia consent Lidocaine 1% used for infiltration Right, radial was placed Catheter size: 20 G Hand hygiene performed  and maximum sterile barriers used   Attempts: 2 Procedure performed without using ultrasound guided technique. Following insertion, dressing applied and Biopatch. Post procedure assessment: normal and unchanged  Patient tolerated the procedure well with no immediate complications. Additional procedure comments: 1st attempt by SRNA.

## 2022-01-21 NOTE — Op Note (Signed)
PATIENT: Michelle Rubio  DAY OF SURGERY: 01/21/22   PRE-OPERATIVE DIAGNOSIS:  Left cystic vestibular schwannoma   POST-OPERATIVE DIAGNOSIS:  Same   PROCEDURE:  Left retrosigmoid craniotomy for tumor resection, use of operating room microscope and facial nerve intraoperative neuromonitoring   SURGEON:  Surgeon(s) and Role:    Judith Part, MD - Primary    Sherley Bounds, MD - Assisting   ANESTHESIA: ETGA   BRIEF HISTORY: This is a 71 year old woman who presented with left sided hearing loss and ataxia. The patient was found to have a large left cystic vestibular schwannoma. Given her history of renal transplant and CKD, we had a long discussion regarding risks and befits as well as discussion regarding the usual risks and benefits of surgical treatment of these tumors and she wished to proceed with surgery.   OPERATIVE DETAIL: The patient was taken to the operating room and placed on the OR table in the supine position. A formal time out was performed with two patient identifiers and confirmed the operative site. Anesthesia was induced by the anesthesia team. The [place] head holder was applied to the head and the patient's head was turned to the right to bring the left mastoid up into the field. A standard retrosigmoid incision was marked on the left, hair was clipped with surgical clippers over the incision and the area was then prepped and draped in a sterile fashion.  A linear incision was placed for a large left retrosigmoid exposure. A craniectomy was created with a high speed drill and rongeurs with careful exposure of the left T-S junction to maximize exposure and minimize any dynamic retraction, no static retractors were used. The angle was tight, I opened cisterns in multiple directions, but there was still minimal room for dissection. I therefore worked superiorly over the top of the tumor and was able to drain the large medial cyst to improve dissection safety and expose the  posterior aspect of the nerve to identify and stimulate the proximal facial nerve. The fourth nerve was draped over the super portion of the cyst, which was carefully dissected off.    Stimulation of the dorsal tumor capsule was negative. The tumor was then debulked then dissected circumferentially off the lower cranial nerves, then CN5, with stimulation along the capsule as it was dissected. There were multiple cystic portions that were connected. The tumor was quite adherent along the entire petrous face, brainstem, and cranial nerves. CN7 was ventral and slightly superior, followed into the IAC on the capsule. This portion of the capsule was left in place. The remaining portion in the IAC was mobilized, but CN7 was too involved in the residual, so I left a residual portion. With good debulking and lysis of the cysts, I opted to get hemostasis and begin closure.   The incision was thoroughly irrigated, hemostasis was confirmed, the dura was reapproximated with suture, epidural space filled with gelfoam, and titanium mesh was placed over the craniectomy defect.   All instrument and sponge counts were correct, the incision was then closed in layers. The patient was then returned to anesthesia for emergence. No apparent complications at the completion of the procedure.   EBL:  341m   DRAINS: none   SPECIMENS: Left CP angle mass   TJudith Part MD 01/21/22 7:45 AM

## 2022-01-21 NOTE — Anesthesia Procedure Notes (Addendum)
Procedure Name: Intubation Date/Time: 01/21/2022 8:04 AM  Performed by: Dorann Lodge, CRNAPre-anesthesia Checklist: Patient identified, Emergency Drugs available, Suction available and Patient being monitored Patient Re-evaluated:Patient Re-evaluated prior to induction Oxygen Delivery Method: Circle system utilized Preoxygenation: Pre-oxygenation with 100% oxygen Induction Type: IV induction Ventilation: Mask ventilation without difficulty Laryngoscope Size: Mac and 3 Grade View: Grade I Tube type: Oral Tube size: 7.0 mm Number of attempts: 1 Airway Equipment and Method: Stylet and Oral airway Placement Confirmation: ETT inserted through vocal cords under direct vision, positive ETCO2 and breath sounds checked- equal and bilateral Secured at: 23 cm Tube secured with: Tape Dental Injury: Teeth and Oropharynx as per pre-operative assessment

## 2022-01-21 NOTE — Evaluation (Signed)
Physical Therapy Evaluation Patient Details Name: Michelle Rubio MRN: [id number] DOB: 1951/03/04 Today's Date: 01/21/2022  History of Present Illness  71 y.o. female presenting 10/23 for Left retrosigmoid craniotomy for resection of left cystic vestibular schwannoma. PMH includes: CKD s/p kidney transplant in 2012, DM II, glaucoma, gout, HLD, HTN, and hypothyroidism.   Clinical Impression  Pt in bed upon arrival of PT, agreeable to evaluation at this time. Prior to admission the pt was using rollator in her home, but also reports significant frequency of falls. The pt now presents with limitations in functional mobility, coordination, strength, stability, and activity tolerance due to above dx, and will continue to benefit from skilled PT to address these deficits. The pt was able to complete bed mobility and initial sit-stand transfer with minA of 1-2, but required up to modA to recover balance with gait. The pt had intermittent scissoring steps, drifts to R, and frequent LOB needing hands-on assist for all OOB mobility at this time. May benefit from short stint acute inpatient rehab to facilitate full return to independence prior to return home.         Recommendations for follow up therapy are one component of a multi-disciplinary discharge planning process, led by the attending physician.  Recommendations may be updated based on patient status, additional functional criteria and insurance authorization.  Follow Up Recommendations Acute inpatient rehab (3hours/day) (vs HHPT pending progression)      Assistance Recommended at Discharge Frequent or constant Supervision/Assistance  Patient can return home with the following  A little help with walking and/or transfers;A little help with bathing/dressing/bathroom;Assistance with cooking/housework;Direct supervision/assist for medications management;Direct supervision/assist for financial management;Assist for transportation;Help with stairs or  ramp for entrance    Equipment Recommendations Rolling walker (2 wheels)  Recommendations for Other Services  Rehab consult    Functional Status Assessment Patient has had a recent decline in their functional status and demonstrates the ability to make significant improvements in function in a reasonable and predictable amount of time.     Precautions / Restrictions Precautions Precautions: Fall Restrictions Weight Bearing Restrictions: No      Mobility  Bed Mobility Overal bed mobility: Needs Assistance Bed Mobility: Supine to Sit, Sit to Supine     Supine to sit: Min assist Sit to supine: Min guard   General bed mobility comments: minA to complete transition to sitting but able to return to bed without assist    Transfers Overall transfer level: Needs assistance Equipment used: 2 person hand held assist, Rolling walker (2 wheels) Transfers: Sit to/from Stand Sit to Stand: Min assist           General transfer comment: minA of 2 with HHA, minA with use of RW and cues for hand placement    Ambulation/Gait Ambulation/Gait assistance: Min assist, Mod assist Gait Distance (Feet): 75 Feet Assistive device: Rolling walker (2 wheels) Gait Pattern/deviations: Step-through pattern, Decreased stride length, Scissoring, Narrow base of support, Staggering right, Drifts right/left Gait velocity: decreased Gait velocity interpretation: <1.31 ft/sec, indicative of household ambulator   General Gait Details: pt maintaining head down, intermittent scissoring steps, increased episodes of LOB when cued to look forwards while walking. pt aware of deficits but unable to correct despite BUE support     Balance Overall balance assessment: Needs assistance, History of Falls Sitting-balance support: Single extremity supported, Feet supported Sitting balance-Leahy Scale: Fair     Standing balance support: Bilateral upper extremity supported, Reliant on assistive device for  balance Standing balance-Leahy Scale:  Poor Standing balance comment: dependent on BUE support and up to modA to correct LOB                             Pertinent Vitals/Pain Pain Assessment Pain Assessment: Faces Faces Pain Scale: Hurts a little bit Pain Location: points to the left side of her face Pain Descriptors / Indicators: Discomfort, Grimacing Pain Intervention(s): Limited activity within patient's tolerance, Monitored during session, Repositioned    Home Living Family/patient expects to be discharged to:: Private residence Living Arrangements: Alone Available Help at Discharge: Family;Available PRN/intermittently Type of Home: House Home Access: Stairs to enter   Entrance Stairs-Number of Steps: 1   Home Layout: One level Home Equipment: Rollator (4 wheels);Cane - single point      Prior Function Prior Level of Function : Independent/Modified Independent;Driving;History of Falls (last six months) (pt reports more falls than she can remember)             Mobility Comments: walks with rollator in the house ADLs Comments: driving     Hand Dominance   Dominant Hand: Right    Extremity/Trunk Assessment   Upper Extremity Assessment Upper Extremity Assessment: Defer to OT evaluation    Lower Extremity Assessment Lower Extremity Assessment: Generalized weakness (with mild incoordination, reports sensation intact)    Cervical / Trunk Assessment Cervical / Trunk Assessment: Kyphotic  Communication   Communication: No difficulties  Cognition Arousal/Alertness: Awake/alert Behavior During Therapy: WFL for tasks assessed/performed Overall Cognitive Status: Within Functional Limits for tasks assessed                                          General Comments General comments (skin integrity, edema, etc.): VSS on RA, slight BP elevation but RN present and increased cleviprex        Assessment/Plan    PT Assessment Patient  needs continued PT services  PT Problem List Decreased strength;Decreased activity tolerance;Decreased balance;Decreased mobility;Decreased coordination;Decreased safety awareness       PT Treatment Interventions DME instruction;Gait training;Stair training;Functional mobility training;Therapeutic activities;Therapeutic exercise;Balance training;Neuromuscular re-education;Patient/family education    PT Goals (Current goals can be found in the Care Plan section)  Acute Rehab PT Goals Patient Stated Goal: return home and not fall PT Goal Formulation: With patient Time For Goal Achievement: 02/04/22 Potential to Achieve Goals: Good    Frequency Min 4X/week     Co-evaluation PT/OT/SLP Co-Evaluation/Treatment: Yes Reason for Co-Treatment: For patient/therapist safety;To address functional/ADL transfers PT goals addressed during session: Mobility/safety with mobility;Balance;Strengthening/ROM OT goals addressed during session: Strengthening/ROM;ADL's and self-care       AM-PAC PT "6 Clicks" Mobility  Outcome Measure Help needed turning from your back to your side while in a flat bed without using bedrails?: A Little Help needed moving from lying on your back to sitting on the side of a flat bed without using bedrails?: A Little Help needed moving to and from a bed to a chair (including a wheelchair)?: A Little Help needed standing up from a chair using your arms (e.g., wheelchair or bedside chair)?: A Little Help needed to walk in hospital room?: A Lot Help needed climbing 3-5 steps with a railing? : A Lot 6 Click Score: 16    End of Session Equipment Utilized During Treatment: Gait belt Activity Tolerance: Patient tolerated treatment well Patient left: in bed;with call  bell/phone within reach;with bed alarm set;with family/visitor present Nurse Communication: Mobility status PT Visit Diagnosis: Other abnormalities of gait and mobility (R26.89);Ataxic gait (R26.0);Repeated falls  (R29.6)    Time: 3491-7915 PT Time Calculation (min) (ACUTE ONLY): 27 min   Charges:   PT Evaluation $PT Eval Moderate Complexity: 1 Mod          [place] Carbo, PT, DPT   Acute Rehabilitation Department  Sandra Cockayne 01/21/2022, 6:17 PM

## 2022-01-21 NOTE — Progress Notes (Signed)
? ?  Inpatient Rehab Admissions Coordinator : ? ?Per therapy recommendations, patient was screened for CIR candidacy by Ethell Blatchford RN MSN.  At this time patient appears to be a potential candidate for CIR. I will place a rehab consult per protocol for full assessment. Please call me with any questions. ? ?Rolene Andrades RN MSN ?Admissions Coordinator ?[phone] ?  ?

## 2022-01-21 NOTE — Transfer of Care (Signed)
Immediate Anesthesia Transfer of Care Note  Patient: Michelle Rubio  Procedure(s) Performed: Left Craniotomy for Tumor Resection (Left: Head)  Patient Location: PACU  Anesthesia Type:General  Level of Consciousness: awake, alert , oriented, patient cooperative, and responds to stimulation  Airway & Oxygen Therapy: Patient Spontanous Breathing  Post-op Assessment: Report given to RN and Post -op Vital signs reviewed and stable  Post vital signs: Reviewed and stable  Last Vitals:  Vitals Value Taken Time  BP    Temp    Pulse 75 01/21/22 1219  Resp 21 01/21/22 1219  SpO2 100 % 01/21/22 1219  Vitals shown include unvalidated device data.  Last Pain:  Vitals:   01/21/22 0649  TempSrc:   PainSc: 0-No pain         Complications: No notable events documented.

## 2022-01-21 NOTE — Anesthesia Postprocedure Evaluation (Signed)
Anesthesia Post Note  Patient: Michelle Rubio  Procedure(s) Performed: Left Craniotomy for Tumor Resection (Left: Head)     Patient location during evaluation: PACU Anesthesia Type: General Level of consciousness: awake and alert Pain management: pain level controlled Vital Signs Assessment: post-procedure vital signs reviewed and stable Respiratory status: spontaneous breathing, nonlabored ventilation and respiratory function stable Cardiovascular status: blood pressure returned to baseline and stable Postop Assessment: no apparent nausea or vomiting Anesthetic complications: no   No notable events documented.  Last Vitals:  Vitals:   01/21/22 1445 01/21/22 1600  BP: 114/63 (!) 111/57  Pulse: 76 82  Resp: 12 11  Temp: (!) 36.2 C   SpO2: 95% 95%    Last Pain:  Vitals:   01/21/22 1415  TempSrc:   PainSc: 3                  Morelia Cassells,W. EDMOND

## 2022-01-21 NOTE — Evaluation (Signed)
Occupational Therapy Evaluation Patient Details Name: Michelle Rubio MRN: [id number] DOB: June 27, 1950 Today's Date: 01/21/2022   History of Present Illness 71 y.o. 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 Pt now s/p Left retrosigmoid craniotomy for tumor resection 10/23.   PHMx:with a history of CKD, transplant.   Clinical Impression   This 71 yo female admitted and underwent above presents to acute OT with PLOF before recent events of being totally independent with all basic ADLs, IADLs, and driving. Currently she is setup/S-min A for basic ADLs at a RW level and needs someone with her anytime she is up on her feet due to decreased balance. She will continue to benefit from acute OT with follow up on AIR currently recommended.      Recommendations for follow up therapy are one component of a multi-disciplinary discharge planning process, led by the attending physician.  Recommendations may be updated based on patient status, additional functional criteria and insurance authorization.   Follow Up Recommendations  Acute inpatient rehab (3hours/day) (if progresses more quickly then [facility])    Assistance Recommended at Discharge Frequent or constant Supervision/Assistance  Patient can return home with the following A little help with walking and/or transfers;A little help with bathing/dressing/bathroom;Assistance with cooking/housework;Assistance with feeding;Help with stairs or ramp for entrance;Assist for transportation;Direct supervision/assist for financial management;Direct supervision/assist for medications management    Functional Status Assessment  Patient has had a recent decline in their functional status and demonstrates the ability to make significant improvements in function in a reasonable and predictable amount of time.  Equipment Recommendations  BSC/3in1;Tub/shower seat       Precautions / Restrictions  Precautions Precautions: Fall Restrictions Weight Bearing Restrictions: No      Mobility Bed Mobility Overal bed mobility: Needs Assistance Bed Mobility: Supine to Sit, Sit to Supine     Supine to sit: Min assist Sit to supine: Min guard        Transfers Overall transfer level: Needs assistance Equipment used: Rolling walker (2 wheels) Transfers: Sit to/from Stand Sit to Stand: Min assist                  Balance Overall balance assessment: Needs assistance Sitting-balance support: Single extremity supported, Feet supported Sitting balance-Leahy Scale: Fair     Standing balance support: Bilateral upper extremity supported, Reliant on assistive device for balance Standing balance-Leahy Scale: Poor                             ADL either performed or assessed with clinical judgement   ADL Overall ADL's : Needs assistance/impaired Eating/Feeding: Set up;Bed level   Grooming: Set up;Supervision/safety;Sitting Grooming Details (indicate cue type and reason): EOB Upper Body Bathing: Set up;Supervision/ safety;Sitting Upper Body Bathing Details (indicate cue type and reason): EOB Lower Body Bathing: Minimal assistance;Sit to/from stand   Upper Body Dressing : Supervision/safety;Set up;Sitting Upper Body Dressing Details (indicate cue type and reason): EOB Lower Body Dressing: Minimal assistance;Sit to/from stand   Toilet Transfer: Minimal assistance;+2 for safety/equipment;Rolling walker (2 wheels) Toilet Transfer Details (indicate cue type and reason): simulated bed>hallway walking>bed Toileting- Clothing Manipulation and Hygiene: Minimal assistance               Vision Baseline Vision/History: 1 Wears glasses Ability to See in Adequate Light: 0 Adequate Patient Visual Report: No change from baseline Additional Comments: Difficulty with tracking in all planes (jerky movements at times and  over/undershooting at times). RIght eye lid closes all  on its own at times while left one remains open. When asked to close both eyes the left eye lid lags behind right one.            Pertinent Vitals/Pain Pain Assessment Pain Assessment: Faces Faces Pain Scale: Hurts a little bit Pain Location: points to the left side of her face Pain Descriptors / Indicators:  (did not describe) Pain Intervention(s): Monitored during session     Hand Dominance Right   Extremity/Trunk Assessment Upper Extremity Assessment Upper Extremity Assessment:  (mild dysmeteria on left for finger to nose)           Communication Communication Communication: No difficulties   Cognition Arousal/Alertness: Awake/alert Behavior During Therapy: WFL for tasks assessed/performed Overall Cognitive Status: Within Functional Limits for tasks assessed                                                  Home Living Family/patient expects to be discharged to:: Private residence Living Arrangements: Alone Available Help at Discharge: Family;Available PRN/intermittently Type of Home: House Home Access: Stairs to enter Entrance Stairs-Number of Steps: 1   Home Layout: One level     Bathroom Shower/Tub: Tub/shower unit;Curtain   Biochemist, clinical: Standard     Home Equipment: Rollator (4 wheels);Cane - single point          Prior Functioning/Environment Prior Level of Function : Independent/Modified Independent;Driving             Mobility Comments: walks with rollator in the house ADLs Comments: driving        OT Problem List: Impaired balance (sitting and/or standing);Impaired vision/perception;Decreased coordination;Decreased safety awareness;Decreased cognition;Decreased knowledge of use of DME or AE;Impaired UE functional use;Pain      OT Treatment/Interventions: Self-care/ADL training;DME and/or AE instruction;Patient/family education;Balance training;Visual/perceptual remediation/compensation    OT Goals(Current goals  can be found in the care plan section) Acute Rehab OT Goals Patient Stated Goal: did not state OT Goal Formulation: With patient Time For Goal Achievement: 02/04/22 Potential to Achieve Goals: Good  OT Frequency: Min 2X/week    Co-evaluation PT/OT/SLP Co-Evaluation/Treatment: Yes Reason for Co-Treatment: For patient/therapist safety;To address functional/ADL transfers PT goals addressed during session: Mobility/safety with mobility;Balance;Proper use of DME;Strengthening/ROM OT goals addressed during session: Strengthening/ROM;ADL's and self-care      AM-PAC OT "6 Clicks" Daily Activity     Outcome Measure Help from another person eating meals?: A Little Help from another person taking care of personal grooming?: A Little Help from another person toileting, which includes using toliet, bedpan, or urinal?: A Little Help from another person bathing (including washing, rinsing, drying)?: A Little Help from another person to put on and taking off regular upper body clothing?: A Little Help from another person to put on and taking off regular lower body clothing?: A Little 6 Click Score: 18   End of Session Equipment Utilized During Treatment: Gait belt;Rolling walker (2 wheels) Nurse Communication: Mobility status  Activity Tolerance: Patient tolerated treatment well Patient left: in bed;with call bell/phone within reach;with bed alarm set;with family/visitor present  OT Visit Diagnosis: Unsteadiness on feet (R26.81);Other abnormalities of gait and mobility (R26.89);Low vision, both eyes (H54.2);Pain Pain - Right/Left: Left Pain - part of body:  (side of face)  Time: 3167-4255 OT Time Calculation (min): 28 min Charges:  OT General Charges $OT Visit: 1 Visit OT Evaluation $OT Eval Moderate Complexity: 1 Mod  Golden Circle, OTR/L Acute [employer] [facility] [phone] Office [phone]    Almon Register 01/21/2022, 5:50 PM

## 2022-01-21 NOTE — Progress Notes (Signed)
Neurosurgery Service Post-operative progress note  Assessment & Plan: 71 y.o. woman s/p L RS rsxn VS, seen in PACU, awake/alert, MAEx4 full strength, +left facial droop with complete eye closure.  -discussed w/ pt, with a House-[facility] 3 should improve significantly with time, discussed that she should expect this preop -as long as eye closure remains good, no need for eye gtt -ICU overnight -MRI brain w/wo to assess size of residual tumor when able -SCDs/TEDs, [facility] POD2 -RFP in AM  -advance diet as tolerated to regular diet  Judith Part  01/21/22 12:44 PM

## 2022-01-22 ENCOUNTER — Encounter: Payer: PPO | Admitting: Physical Therapy

## 2022-01-22 ENCOUNTER — Inpatient Hospital Stay (HOSPITAL_COMMUNITY): Payer: PPO

## 2022-01-22 ENCOUNTER — Encounter (HOSPITAL_COMMUNITY): Payer: Self-pay | Admitting: Neurological Surgery

## 2022-01-22 LAB — RENAL FUNCTION PANEL
Albumin: 3.2 g/dL — ABNORMAL LOW (ref 3.5–5.0)
Anion gap: 15 (ref 5–15)
BUN: 30 mg/dL — ABNORMAL HIGH (ref 8–23)
CO2: 21 mmol/L — ABNORMAL LOW (ref 22–32)
Calcium: 8.5 mg/dL — ABNORMAL LOW (ref 8.9–10.3)
Chloride: 102 mmol/L (ref 98–111)
Creatinine, Ser: 1.82 mg/dL — ABNORMAL HIGH (ref 0.44–1.00)
GFR, Estimated: 30 mL/min — ABNORMAL LOW (ref >60)
Glucose, Bld: 166 mg/dL — ABNORMAL HIGH (ref 70–99)
Phosphorus: 3 mg/dL (ref 2.5–4.6)
Potassium: 4.2 mmol/L (ref 3.5–5.1)
Sodium: 138 mmol/L (ref 135–145)

## 2022-01-22 LAB — GLUCOSE, CAPILLARY
Glucose-Capillary: 122 mg/dL — ABNORMAL HIGH (ref 70–99)
Glucose-Capillary: 129 mg/dL — ABNORMAL HIGH (ref 70–99)

## 2022-01-22 LAB — SURGICAL PATHOLOGY

## 2022-01-22 MED ORDER — DIPHENHYDRAMINE HCL 25 MG PO CAPS
50.0000 mg | ORAL_CAPSULE | Freq: Every evening | ORAL | Status: DC | PRN
  Administered 2022-01-22: 50 mg via ORAL
  Filled 2022-01-22: qty 2

## 2022-01-22 MED ORDER — FAMOTIDINE 10 MG PO TABS
10.0000 mg | ORAL_TABLET | Freq: Every day | ORAL | Status: DC
  Administered 2022-01-22 – 2022-01-23 (×2): 10 mg via ORAL
  Filled 2022-01-22 (×2): qty 1

## 2022-01-22 MED ORDER — GADOBUTROL 1 MMOL/ML IV SOLN
6.0000 mL | Freq: Once | INTRAVENOUS | Status: AC | PRN
  Administered 2022-01-22: 6 mL via INTRAVENOUS

## 2022-01-22 MED ORDER — POLYVINYL ALCOHOL 1.4 % OP SOLN
1.0000 [drp] | OPHTHALMIC | Status: DC
  Administered 2022-01-22 – 2022-01-23 (×7): 1 [drp] via OPHTHALMIC
  Filled 2022-01-22: qty 15

## 2022-01-22 MED ORDER — ZOLPIDEM TARTRATE 5 MG PO TABS: 5.0000 mg | ORAL_TABLET | Freq: Every evening | ORAL | Status: DC | PRN

## 2022-01-22 NOTE — Progress Notes (Addendum)
  Inpatient Rehabilitation Admissions Coordinator   Met with patient, sister and son, Jerrye Beavers at bedside for rehab assessment. We discussed goals and expectations of a possible CIR admit. Jerrye Beavers states that family are arranging rotating schedule to provide assistance for her at home. They would prefer CIR admit prior to discharge home. I will begin insurance Auth with [employer]. I will follow up tomorrow. Please call me with any questions.   Danne Baxter, RN, MSN Rehab Admissions Coordinator [phone]

## 2022-01-22 NOTE — Progress Notes (Signed)
Neurosurgery Service Progress Note  Subjective: No acute events overnight, L facial weakness upon awakening, otherwise unremarkable   Objective: Vitals:   01/22/22 0715 01/22/22 0730 01/22/22 0745 01/22/22 0800  BP: 126/66 136/69 135/67 (!) 145/76  Pulse: 97 97 98 (!) 104  Resp: '16 17 17 16  '$ Temp:    98.5 F (36.9 C)  TempSrc:    Oral  SpO2: 97% 97% 99% 98%  Weight:      Height:        Physical Exam: Awake/alert, Ox3, PERRL, gaze conjugate / EOMI, left face with HB4 weakness with incomplete eye closure, face symmetrically sensate, tongue midline, strength 5/5x4, incision c/d/I, no voice hoarseness, able to swallow liquids without issue  Assessment & Plan: 71 y.o. 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 MRI with expected subtotal, 70% resection by volume, lower cranial nerve function intact  -PT/OT -transfer to floor -d/c foley -some insomnia overnight, will start non-benzo benzo -eye gtt during the day, tape lid closed at night -SCDs/TEDs, [facility] tomorrow  Judith Part  01/22/22 9:24 AM

## 2022-01-22 NOTE — Progress Notes (Signed)
RT called by RN to assess malfunctioning art line. RT discovered art line continuously not presenting a waveform. RT also unable to withdraw blood from the line as well. RN stated that she was also unable to operate. Art line pulled due to malfunction. No issues at this time.

## 2022-01-22 NOTE — Progress Notes (Signed)
Physical Therapy Treatment Patient Details Name: Michelle Rubio MRN: [id number] DOB: May 19, 1950 Today's Date: 01/22/2022   History of Present Illness 71 y.o. female presenting 10/23 for Left retrosigmoid craniotomy for resection of left cystic vestibular schwannoma. PMH includes: CKD s/p kidney transplant in 2012, DM II, glaucoma, gout, HLD, HTN, and hypothyroidism.    PT Comments    Pt progressing well towards all goals. Pt remains to have inability to completely close L eye, L facial droop, impaired coordination, impaired balance, and requires minA for all function. Pt was able to ambulate looking forward today however continues to vear R and demo's ataxia in stepping pattern. Pt also with c/o echoing in R ear today. Pt to greatly benefit from AIR upon d/c for maximal functional recovery. Pt demonstrating excellent rehab potential. Acute PT to cont to follow.    Recommendations for follow up therapy are one component of a multi-disciplinary discharge planning process, led by the attending physician.  Recommendations may be updated based on patient status, additional functional criteria and insurance authorization.  Follow Up Recommendations  Acute inpatient rehab (3hours/day) (vs HHPT pending progression)     Assistance Recommended at Discharge Frequent or constant Supervision/Assistance  Patient can return home with the following A little help with walking and/or transfers;A little help with bathing/dressing/bathroom;Assistance with cooking/housework;Direct supervision/assist for medications management;Direct supervision/assist for financial management;Assist for transportation;Help with stairs or ramp for entrance   Equipment Recommendations  Rolling walker (2 wheels)    Recommendations for Other Services Rehab consult     Precautions / Restrictions Precautions Precautions: Fall Restrictions Weight Bearing Restrictions: No     Mobility  Bed Mobility Overal bed mobility:  Needs Assistance Bed Mobility: Supine to Sit     Supine to sit: Min guard     General bed mobility comments: increased time, min G for safety    Transfers Overall transfer level: Needs assistance   Transfers: Sit to/from Stand Sit to Stand: Min assist           General transfer comment: minA to power up and bring to neutral as pt with posterior bias, verbal cues for safe hand placement to push up from bed, minA to steady during transition of hands to RW from bed    Ambulation/Gait Ambulation/Gait assistance: Min assist, Mod assist Gait Distance (Feet): 150 Feet Assistive device: Rolling walker (2 wheels) Gait Pattern/deviations: Step-through pattern, Decreased stride length, Narrow base of support, Staggering right, Drifts right/left, Step-to pattern Gait velocity: decreased Gait velocity interpretation: <1.31 ft/sec, indicative of household ambulator   General Gait Details: pt with improved ability to look ahead when ambulating today with no scissoring today. Pt continues to vear to the R and progressed from short step to gait pattern to reciprocal gait pattern intermittently at decreased cadence. Pt requiring modA initially for walker management to minimize vearing R, pt then able to recognize and self correct by going back to middle of hallway, HR 126bpm, mild tremors, suspect due to nerves, verbal cues to decrease dependency on UEs   Stairs             Wheelchair Mobility    Modified Rankin (Stroke Patients Only)       Balance Overall balance assessment: Needs assistance, History of Falls Sitting-balance support: Single extremity supported, Feet supported Sitting balance-Leahy Scale: Fair     Standing balance support: Bilateral upper extremity supported, Reliant on assistive device for balance Standing balance-Leahy Scale: Poor Standing balance comment: dependent on BUE support and up to [employer]  to correct LOB, pt did stand at sink to brush teeth, noted  tremors and UE impaired coordination during putting on cap to toothepaste and toothpaste on tooth brush                            Cognition Arousal/Alertness: Awake/alert Behavior During Therapy: Anxious (fear of falling, HR in 120s t/o PT session) Overall Cognitive Status: Within Functional Limits for tasks assessed                                 General Comments: slightly impulsive but easily redirectable        Exercises      General Comments        Pertinent Vitals/Pain Pain Assessment Pain Assessment: No/denies pain    Home Living                          Prior Function            PT Goals (current goals can now be found in the care plan section) Acute Rehab PT Goals Patient Stated Goal: return home and not fall PT Goal Formulation: With patient Time For Goal Achievement: 02/04/22 Potential to Achieve Goals: Good Progress towards PT goals: Progressing toward goals    Frequency    Min 4X/week      PT Plan Current plan remains appropriate    Co-evaluation              AM-PAC PT "6 Clicks" Mobility   Outcome Measure  Help needed turning from your back to your side while in a flat bed without using bedrails?: A Little Help needed moving from lying on your back to sitting on the side of a flat bed without using bedrails?: A Little Help needed moving to and from a bed to a chair (including a wheelchair)?: A Little Help needed standing up from a chair using your arms (e.g., wheelchair or bedside chair)?: A Little Help needed to walk in hospital room?: A Lot Help needed climbing 3-5 steps with a railing? : A Lot 6 Click Score: 16    End of Session Equipment Utilized During Treatment: Gait belt Activity Tolerance: Patient tolerated treatment well Patient left: in bed;with call bell/phone within reach;with bed alarm set;with family/visitor present Nurse Communication: Mobility status PT Visit Diagnosis: Other  abnormalities of gait and mobility (R26.89);Ataxic gait (R26.0);Repeated falls (R29.6)     Time: 1761-6073 PT Time Calculation (min) (ACUTE ONLY): 40 min  Charges:  $Gait Training: 23-37 mins $Therapeutic Activity: 8-22 mins                     Kittie Plater, PT, DPT Acute Rehabilitation Services Secure chat preferred Office #: [phone]    Berline Lopes 01/22/2022, 10:04 AM

## 2022-01-23 ENCOUNTER — Inpatient Hospital Stay (HOSPITAL_COMMUNITY)
Admission: RE | Admit: 2022-01-23 | Discharge: 2022-01-28 | DRG: 949 | Disposition: A | Discharge status: Home / Self Care | Payer: PPO | Source: Intra-hospital | Attending: Physical Medicine & Rehabilitation | Admitting: Physical Medicine & Rehabilitation

## 2022-01-23 ENCOUNTER — Other Ambulatory Visit: Payer: Self-pay

## 2022-01-23 ENCOUNTER — Encounter (HOSPITAL_COMMUNITY): Payer: Self-pay | Admitting: Physical Medicine & Rehabilitation

## 2022-01-23 DIAGNOSIS — G518 Other disorders of facial nerve: Secondary | ICD-10-CM | POA: Diagnosis present

## 2022-01-23 DIAGNOSIS — D333 Benign neoplasm of cranial nerves: Principal | ICD-10-CM | POA: Diagnosis present

## 2022-01-23 DIAGNOSIS — R809 Proteinuria, unspecified: Secondary | ICD-10-CM | POA: Diagnosis not present

## 2022-01-23 DIAGNOSIS — Z79890 Hormone replacement therapy (postmenopausal): Secondary | ICD-10-CM | POA: Diagnosis not present

## 2022-01-23 DIAGNOSIS — E785 Hyperlipidemia, unspecified: Secondary | ICD-10-CM | POA: Diagnosis present

## 2022-01-23 DIAGNOSIS — Z940 Kidney transplant status: Secondary | ICD-10-CM | POA: Diagnosis not present

## 2022-01-23 DIAGNOSIS — E1122 Type 2 diabetes mellitus with diabetic chronic kidney disease: Secondary | ICD-10-CM | POA: Diagnosis present

## 2022-01-23 DIAGNOSIS — Z7984 Long term (current) use of oral hypoglycemic drugs: Secondary | ICD-10-CM | POA: Diagnosis not present

## 2022-01-23 DIAGNOSIS — Z7982 Long term (current) use of aspirin: Secondary | ICD-10-CM | POA: Diagnosis not present

## 2022-01-23 DIAGNOSIS — R059 Cough, unspecified: Secondary | ICD-10-CM | POA: Diagnosis not present

## 2022-01-23 DIAGNOSIS — R29810 Facial weakness: Secondary | ICD-10-CM | POA: Diagnosis not present

## 2022-01-23 DIAGNOSIS — I129 Hypertensive chronic kidney disease with stage 1 through stage 4 chronic kidney disease, or unspecified chronic kidney disease: Secondary | ICD-10-CM | POA: Diagnosis not present

## 2022-01-23 DIAGNOSIS — M109 Gout, unspecified: Secondary | ICD-10-CM | POA: Diagnosis not present

## 2022-01-23 DIAGNOSIS — Z8249 Family history of ischemic heart disease and other diseases of the circulatory system: Secondary | ICD-10-CM

## 2022-01-23 DIAGNOSIS — E039 Hypothyroidism, unspecified: Secondary | ICD-10-CM | POA: Diagnosis present

## 2022-01-23 DIAGNOSIS — R509 Fever, unspecified: Secondary | ICD-10-CM | POA: Diagnosis not present

## 2022-01-23 DIAGNOSIS — N184 Chronic kidney disease, stage 4 (severe): Secondary | ICD-10-CM | POA: Diagnosis present

## 2022-01-23 DIAGNOSIS — Z48811 Encounter for surgical aftercare following surgery on the nervous system: Principal | ICD-10-CM

## 2022-01-23 DIAGNOSIS — H409 Unspecified glaucoma: Secondary | ICD-10-CM | POA: Diagnosis present

## 2022-01-23 DIAGNOSIS — Z86018 Personal history of other benign neoplasm: Secondary | ICD-10-CM

## 2022-01-23 DIAGNOSIS — R000 Tachycardia, unspecified: Secondary | ICD-10-CM | POA: Diagnosis present

## 2022-01-23 LAB — GLUCOSE, CAPILLARY
Glucose-Capillary: 178 mg/dL — ABNORMAL HIGH (ref 70–99)
Glucose-Capillary: 133 mg/dL — ABNORMAL HIGH (ref 70–99)
Glucose-Capillary: 118 mg/dL — ABNORMAL HIGH (ref 70–99)

## 2022-01-23 MED ORDER — DOCUSATE SODIUM 100 MG PO CAPS
100.0000 mg | ORAL_CAPSULE | Freq: Two times a day (BID) | ORAL | Status: DC
  Administered 2022-01-23 – 2022-01-28 (×10): 100 mg via ORAL
  Filled 2022-01-23 (×10): qty 1

## 2022-01-23 MED ORDER — TACROLIMUS 1 MG PO CAPS
2.0000 mg | ORAL_CAPSULE | Freq: Two times a day (BID) | ORAL | Status: DC
  Administered 2022-01-23 – 2022-01-28 (×10): 2 mg via ORAL
  Filled 2022-01-23 (×10): qty 2

## 2022-01-23 MED ORDER — PROCHLORPERAZINE EDISYLATE 10 MG/2ML IJ SOLN: 5.0000 mg | Freq: Four times a day (QID) | INTRAMUSCULAR | Status: DC | PRN

## 2022-01-23 MED ORDER — POLYVINYL ALCOHOL 1.4 % OP SOLN: 1.0000 [drp] | OPHTHALMIC | 0 refills | Status: AC

## 2022-01-23 MED ORDER — MYCOPHENOLATE SODIUM 180 MG PO TBEC
360.0000 mg | DELAYED_RELEASE_TABLET | Freq: Two times a day (BID) | ORAL | Status: DC
  Administered 2022-01-23 – 2022-01-28 (×10): 360 mg via ORAL
  Filled 2022-01-23 (×10): qty 2

## 2022-01-23 MED ORDER — EZETIMIBE 10 MG PO TABS
10.0000 mg | ORAL_TABLET | Freq: Every day | ORAL | Status: DC
  Administered 2022-01-24 – 2022-01-28 (×5): 10 mg via ORAL
  Filled 2022-01-23 (×5): qty 1

## 2022-01-23 MED ORDER — PROCHLORPERAZINE MALEATE 5 MG PO TABS: 5.0000 mg | ORAL_TABLET | Freq: Four times a day (QID) | ORAL | Status: DC | PRN

## 2022-01-23 MED ORDER — GUAIFENESIN-DM 100-10 MG/5ML PO SYRP: 5.0000 mL | ORAL_SOLUTION | Freq: Four times a day (QID) | ORAL | Status: DC | PRN

## 2022-01-23 MED ORDER — FLEET ENEMA 7-19 GM/118ML RE ENEM: 1.0000 | ENEMA | Freq: Once | RECTAL | Status: DC | PRN

## 2022-01-23 MED ORDER — METOPROLOL TARTRATE 12.5 MG HALF TABLET: 12.5000 mg | ORAL_TABLET | Freq: Two times a day (BID) | ORAL | Status: DC

## 2022-01-23 MED ORDER — HEPARIN SODIUM (PORCINE) 5000 UNIT/ML IJ SOLN
5000.0000 [IU] | Freq: Three times a day (TID) | INTRAMUSCULAR | Status: DC
  Administered 2022-01-23 – 2022-01-27 (×11): 5000 [IU] via SUBCUTANEOUS
  Filled 2022-01-23 (×11): qty 1

## 2022-01-23 MED ORDER — SENNOSIDES-DOCUSATE SODIUM 8.6-50 MG PO TABS: 1.0000 | ORAL_TABLET | Freq: Every evening | ORAL | Status: DC | PRN

## 2022-01-23 MED ORDER — POLYVINYL ALCOHOL 1.4 % OP SOLN
1.0000 [drp] | OPHTHALMIC | Status: DC
  Administered 2022-01-23 – 2022-01-28 (×24): 1 [drp] via OPHTHALMIC
  Filled 2022-01-23: qty 15

## 2022-01-23 MED ORDER — LEVOTHYROXINE SODIUM 75 MCG PO TABS
75.0000 ug | ORAL_TABLET | Freq: Every day | ORAL | Status: DC
  Administered 2022-01-24 – 2022-01-28 (×5): 75 ug via ORAL
  Filled 2022-01-23 (×5): qty 1

## 2022-01-23 MED ORDER — ACETAMINOPHEN 325 MG PO TABS
325.0000 mg | ORAL_TABLET | ORAL | Status: DC | PRN
  Administered 2022-01-23: 650 mg via ORAL
  Filled 2022-01-23: qty 2

## 2022-01-23 MED ORDER — LINAGLIPTIN 5 MG PO TABS
5.0000 mg | ORAL_TABLET | Freq: Every day | ORAL | Status: DC
  Administered 2022-01-24 – 2022-01-28 (×5): 5 mg via ORAL
  Filled 2022-01-23 (×5): qty 1

## 2022-01-23 MED ORDER — DICLOFENAC SODIUM 1 % EX GEL: 2.0000 g | Freq: Every day | CUTANEOUS | Status: DC | PRN

## 2022-01-23 MED ORDER — PROCHLORPERAZINE 25 MG RE SUPP: 12.5000 mg | Freq: Four times a day (QID) | RECTAL | Status: DC | PRN

## 2022-01-23 MED ORDER — FAMOTIDINE 20 MG PO TABS
10.0000 mg | ORAL_TABLET | Freq: Every day | ORAL | Status: DC
  Administered 2022-01-24 – 2022-01-28 (×5): 10 mg via ORAL
  Filled 2022-01-23 (×5): qty 1

## 2022-01-23 MED ORDER — HYDROCODONE-ACETAMINOPHEN 5-325 MG PO TABS
1.0000 | ORAL_TABLET | ORAL | Status: DC | PRN
  Administered 2022-01-23: 1 via ORAL
  Filled 2022-01-23: qty 1

## 2022-01-23 MED ORDER — BRIMONIDINE TARTRATE 0.2 % OP SOLN
1.0000 [drp] | Freq: Two times a day (BID) | OPHTHALMIC | Status: DC
  Administered 2022-01-23 – 2022-01-28 (×10): 1 [drp] via OPHTHALMIC
  Filled 2022-01-23: qty 5

## 2022-01-23 MED ORDER — INSULIN ASPART 100 UNIT/ML IJ SOLN
0.0000 [IU] | Freq: Three times a day (TID) | INTRAMUSCULAR | Status: DC
  Administered 2022-01-23 – 2022-01-24 (×3): 2 [IU] via SUBCUTANEOUS
  Administered 2022-01-25 – 2022-01-26 (×2): 3 [IU] via SUBCUTANEOUS
  Administered 2022-01-27: 2 [IU] via SUBCUTANEOUS

## 2022-01-23 MED ORDER — DIPHENHYDRAMINE HCL 12.5 MG/5ML PO ELIX: 12.5000 mg | ORAL_SOLUTION | Freq: Four times a day (QID) | ORAL | Status: DC | PRN

## 2022-01-23 MED ORDER — INSULIN ASPART 100 UNIT/ML IJ SOLN: 0.0000 [IU] | Freq: Every day | INTRAMUSCULAR | Status: DC

## 2022-01-23 MED ORDER — METHOCARBAMOL 500 MG PO TABS: 500.0000 mg | ORAL_TABLET | Freq: Four times a day (QID) | ORAL | Status: DC | PRN

## 2022-01-23 MED ORDER — NETARSUDIL-LATANOPROST 0.02-0.005 % OP SOLN
1.0000 [drp] | Freq: Every day | OPHTHALMIC | Status: DC
  Administered 2022-01-23 – 2022-01-27 (×5): 1 [drp] via OPHTHALMIC

## 2022-01-23 MED ORDER — METOPROLOL TARTRATE 25 MG PO TABS
25.0000 mg | ORAL_TABLET | Freq: Two times a day (BID) | ORAL | Status: DC
  Administered 2022-01-23 – 2022-01-28 (×10): 25 mg via ORAL
  Filled 2022-01-23 (×10): qty 1

## 2022-01-23 MED ORDER — SULFAMETHOXAZOLE-TRIMETHOPRIM 400-80 MG PO TABS
1.0000 | ORAL_TABLET | ORAL | Status: DC
  Administered 2022-01-25 – 2022-01-28 (×2): 1 via ORAL
  Filled 2022-01-23 (×2): qty 1

## 2022-01-23 MED ORDER — TRAZODONE HCL 50 MG PO TABS: 25.0000 mg | ORAL_TABLET | Freq: Every evening | ORAL | Status: DC | PRN

## 2022-01-23 MED ORDER — SORBITOL 70 % SOLN: 30.0000 mL | Freq: Every day | Status: DC | PRN

## 2022-01-23 MED ORDER — HEPARIN SODIUM (PORCINE) 5000 UNIT/ML IJ SOLN: 5000.0000 [IU] | Freq: Three times a day (TID) | INTRAMUSCULAR | Status: DC

## 2022-01-23 NOTE — Progress Notes (Signed)
Neurosurgery Service Progress Note  Subjective: No acute events overnight, no new complaints  Objective: Vitals:   01/22/22 2141 01/22/22 2256 01/23/22 0053 01/23/22 0852  BP: (!) 177/84 (!) 145/72 (!) 158/79 137/86  Pulse: (!) 103 88 91 99  Resp: '17  17 18  '$ Temp: 100.1 F (37.8 C)  100.2 F (37.9 C) 100 F (37.8 C)  TempSrc: Oral  Oral Oral  SpO2: 98% 97% 98% 97%  Weight:      Height:        Physical Exam: Awake/alert, Ox3, PERRL, gaze conjugate / EOMI, left face with HB4 weakness with incomplete eye closure, face symmetrically sensate, tongue midline, strength 5/5x4, incision c/d/I, no voice hoarseness  Assessment & Plan: 71 y.o. 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 MRI with expected subtotal, 70% resection by volume, lower cranial nerve function intact  -PT/OT rec CIR, ok for xfer to CIR when bed available -eye gtt during the day, tape lid closed at night -SCDs/TEDs, [facility]  Judith Part  01/23/22 9:24 AM

## 2022-01-23 NOTE — H&P (Signed)
Physical Medicine and Rehabilitation Admission H&P    CC: Functional deficits secondary to left cystic vestibular schwannoma  HPI: Michelle Rubio is a 71 year old female who presented with symptomatic left cystic vestibular schwannoma.  She underwent left craniectomy with resection of her tumor on 01/21/2022 by Dr. Venetia Constable.  Her preoperative symptoms included left hearing loss and ataxia.  On postop day 0, her examination revealed left facial droop with complete eye closure. She was observed overnight in the ICU. House-Brackmann score 4 on POD 1.  Follow-up MRI of the brain revealed expected subtotal, 70% resection by volume with lower cranial nerve function intact.  Eyedrops continue and left lid taped closed at nighttime.  Of cutaneous heparin started for DVT prophylaxis.  Home aspirin 81 mg on hold. The patient requires inpatient physical medicine and rehabilitation evaluations and treatment secondary to dysfunction due to left cystic vestibular schwannoma that is postresection.  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 Her nephrologist is Dr. Kris Mouton at [facility].  EKG 01/18/2022: Normal sinus rhythm.  Rate 71. Right atrial enlargement. Nonspecific ST and T wave abnormality. A1C on 01/18/2022 = 6.8%. She would prefer to take her home glaucoma medications rather than the medications she is on here.  Review of Systems  Constitutional:  Negative for chills and fever.  HENT:  Negative for congestion and sore throat.        Mild incisional pain  Respiratory:  Negative for cough and shortness of breath.   Cardiovascular:  Negative for chest pain.  Genitourinary:  Negative for dysuria and urgency.   Past Medical History:  Diagnosis Date   Acute renal failure on dialysis ([place])    Body mass index (BMI) 20.0-20.9, adult    Chronic  kidney disease    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   Diabetes mellitus without complication ([place])    type 2   Glaucoma    Gout    H/O Graves' disease 1980s   Hyperlipidemia    Hypertension    Hypothyroidism    Mitral valve prolapse    Past Surgical History:  Procedure Laterality Date   CRANIOTOMY Left 01/21/2022   Procedure: Left Craniotomy for Tumor Resection;  Surgeon: Judith Part, MD;  Location: [place];  Service: Neurosurgery;  Laterality: Left;   KIDNEY TRANSPLANT  2012   THYROIDECTOMY, PARTIAL  1985   TONSILLECTOMY     TUBAL LIGATION     Family History  Problem Relation Age of Onset   Coronary artery disease Mother    Heart disease Father        CHF   Social History:  reports that she has never smoked. She does not have any smokeless tobacco history on file. She reports that she does not drink alcohol and does not use drugs. Allergies: No Known Allergies Medications Prior to Admission  Medication Sig Dispense Refill   aspirin 81 MG tablet Take 81 mg by mouth daily.     Bempedoic Acid (NEXLETOL) 180 MG TABS Take 180 mg by mouth daily.     brimonidine (ALPHAGAN) 0.2 % ophthalmic solution Place 1 drop into both eyes 2 (two) times daily.     diclofenac Sodium (VOLTAREN) 1 % GEL Apply 2 g topically daily as needed (knee pain).     ezetimibe (ZETIA) 10 MG tablet Take 10 mg by mouth daily.     levothyroxine (SYNTHROID) 75 MCG  tablet Take 75 mcg by mouth daily.     metoprolol tartrate (LOPRESSOR) 25 MG tablet Take 12.5 mg by mouth 2 (two) times daily.     mycophenolate (MYFORTIC) 180 MG EC tablet Take 360 mg by mouth 2 (two) times daily.      Omega-3 Fatty Acids (FISH OIL) 1200 MG CAPS Take 2,400 mg by mouth daily.     polyvinyl alcohol (LIQUIFILM TEARS) 1.4 % ophthalmic solution Place 1 drop into the left eye every 4 (four) hours while awake. 15 mL 0   ROCKLATAN 0.02-0.005 % SOLN Place 1 drop into both eyes at bedtime.     sitaGLIPtin (JANUVIA) 25 MG  tablet Take 25 mg by mouth daily.     sulfamethoxazole-trimethoprim (BACTRIM,SEPTRA) 400-80 MG per tablet Take 1 tablet by mouth every Monday, Wednesday, and Friday.     tacrolimus (PROGRAF) 1 MG capsule Take 2 mg by mouth 2 (two) times daily.      Home: Home Living Family/patient expects to be discharged to:: Private residence Living Arrangements: Alone Available Help at Discharge: Family, Available 24 hours/day (family to rotate schedule to  provide supervision) Type of Home: House Home Access: Stairs to enter [employer] of Steps: 1 Home Layout: One level Bathroom Shower/Tub: Tub/shower unit, Architectural technologist: Programmer, systems: Yes Home Equipment: Rollator (4 wheels), Cane - single point  Lives With: Alone   Functional History: Prior Function Prior Level of Function : Independent/Modified Independent, Driving, History of Falls (last six months) (pt reports more falls than she can remember) Mobility Comments: walks with rollator in the house ADLs Comments: driving   Functional Status:  Mobility: Bed Mobility Overal bed mobility: Needs Assistance Bed Mobility: Supine to Sit Supine to sit: Min guard Sit to supine: Min guard General bed mobility comments: increased time, min G for safety Transfers Overall transfer level: Needs assistance Equipment used: 2 person hand held assist, Rolling walker (2 wheels) Transfers: Sit to/from Stand Sit to Stand: Min assist General transfer comment: minA to power up and bring to neutral as pt with posterior bias, verbal cues for safe hand placement to push up from bed, minA to steady during transition of hands to RW from bed Ambulation/Gait Ambulation/Gait assistance: Min assist, Mod assist Gait Distance (Feet): 150 Feet Assistive device: Rolling walker (2 wheels) Gait Pattern/deviations: Step-through pattern, Decreased stride length, Narrow base of support, Staggering right, Drifts right/left, Step-to  pattern General Gait Details: pt with improved ability to look ahead when ambulating today with no scissoring today. Pt continues to vear to the R and progressed from short step to gait pattern to reciprocal gait pattern intermittently at decreased cadence. Pt requiring modA initially for walker management to minimize vearing R, pt then able to recognize and self correct by going back to middle of hallway, HR 126bpm, mild tremors, suspect due to nerves, verbal cues to decrease dependency on UEs Gait velocity: decreased Gait velocity interpretation: <1.31 ft/sec, indicative of household ambulator   ADL: ADL Overall ADL's : Needs assistance/impaired Eating/Feeding: Set up, Bed level Grooming: Set up, Supervision/safety, Sitting Grooming Details (indicate cue type and reason): EOB Upper Body Bathing: Set up, Supervision/ safety, Sitting Upper Body Bathing Details (indicate cue type and reason): EOB Lower Body Bathing: Minimal assistance, Sit to/from stand Upper Body Dressing : Supervision/safety, Set up, Sitting Upper Body Dressing Details (indicate cue type and reason): EOB Lower Body Dressing: Minimal assistance, Sit to/from stand Toilet Transfer: Minimal assistance, +2 for safety/equipment, Rolling walker (2 wheels) Toilet Transfer Details (  indicate cue type and reason): simulated bed>hallway walking>bed Toileting- Clothing Manipulation and Hygiene: Minimal assistance   Cognition: Cognition Overall Cognitive Status: Within Functional Limits for tasks assessed Orientation Level: Oriented X4 Cognition Arousal/Alertness: Awake/alert Behavior During Therapy: Anxious (fear of falling, HR in 120s t/o PT session) Overall Cognitive Status: Within Functional Limits for tasks assessed General Comments: slightly impulsive but easily redirectable  Physical Exam: Blood pressure (!) 161/98, pulse (!) 103, temperature 99.3 F (37.4 C), resp. rate 16, height '5\' 8"'$  (1.727 m), weight 62.5 kg, SpO2 100  %. Physical Exam Constitutional:      General: She is not in acute distress. Cardiovascular:     Rate and Rhythm:Tachycardic and regular rhythm.  Pulmonary:     Effort: Pulmonary effort is normal.  Abdominal:     Palpations: Abdomen is soft.  Neurological:     Mental Status: She is alert and oriented to person, place, and time.  +moderate facial droop. 5/5 strength throughout upper extremities, 4/5 strength in lower extremities. Sensation is intact.  Psychiatric:        Mood and Affect: Mood normal.        Behavior: Behavior normal.  Skin: 2 IVs in left arm and wrist   Results for orders placed or performed during the hospital encounter of 01/21/22 (from the past 48 hour(s))  Renal function panel     Status: Abnormal   Collection Time: 01/22/22  7:05 AM  Result Value Ref Range   Sodium 138 135 - 145 mmol/L   Potassium 4.2 3.5 - 5.1 mmol/L   Chloride 102 98 - 111 mmol/L   CO2 21 (L) 22 - 32 mmol/L   Glucose, Bld 166 (H) 70 - 99 mg/dL    Comment: Glucose reference range applies only to samples taken after fasting for at least 8 hours.   BUN 30 (H) 8 - 23 mg/dL   Creatinine, Ser 1.82 (H) 0.44 - 1.00 mg/dL   Calcium 8.5 (L) 8.9 - 10.3 mg/dL   Phosphorus 3.0 2.5 - 4.6 mg/dL   Albumin 3.2 (L) 3.5 - 5.0 g/dL   GFR, Estimated 30 (L) >60 mL/min    Comment: (NOTE) Calculated using the CKD-EPI Creatinine Equation (2021)    Anion gap 15 5 - 15    Comment: Performed at [facility] [address] [place], [place] [address]  Glucose, capillary     Status: Abnormal   Collection Time: 01/22/22 12:43 PM  Result Value Ref Range   Glucose-Capillary 122 (H) 70 - 99 mg/dL    Comment: Glucose reference range applies only to samples taken after fasting for at least 8 hours.  Glucose, capillary     Status: Abnormal   Collection Time: 01/22/22  7:28 PM  Result Value Ref Range   Glucose-Capillary 129 (H) 70 - 99 mg/dL    Comment: Glucose reference range applies only to samples taken  after fasting for at least 8 hours.  Glucose, capillary     Status: Abnormal   Collection Time: 01/23/22 12:01 PM  Result Value Ref Range   Glucose-Capillary 178 (H) 70 - 99 mg/dL    Comment: Glucose reference range applies only to samples taken after fasting for at least 8 hours.   MR BRAIN W WO CONTRAST  Result Date: 01/22/2022 CLINICAL DATA:  Follow-up debulking of vestibular schwannoma EXAM: MRI HEAD WITHOUT AND WITH CONTRAST TECHNIQUE: Multiplanar, multiecho pulse sequences of the brain and surrounding structures were obtained without and with intravenous contrast. CONTRAST:  52m  GADAVIST GADOBUTROL 1 MMOL/ML IV SOLN COMPARISON:  12/24/2021 FINDINGS: Brain: Multifocal diffusion abnormality primarily along the falx cerebri, likely pneumocephalus. Small amount of acute ischemia along the left posterior resection site. Blood products at the resection site. 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 There is a small amount of residual contrast enhancement at the porus acusticus. Edema surrounding the resection site. There is multifocal periventricular white matter hyperintensity, most often a result of chronic microvascular ischemia. Mild generalized volume loss. Mass effect on the brainstem is improved. There is improved patency of the cerebral aqueduct. Vascular: Major flow voids are preserved. Skull and upper cervical spine: Left retromastoid craniectomy. Sinuses/Orbits:Left mastoid effusion. Paranasal sinuses are clear. Normal orbits. IMPRESSION: 1. Marked decrease in size of left cerebellopontine angle mass, now 2.8 x 2.1 x 2.9 cm, previously 3.5 x 2.8 x 3.5 cm. 2. Improved mass effect on the brainstem and cerebral aqueduct. 3. Small amount of acute ischemia along the left posterior resection site. Electronically Signed   By: Ulyses Jarred M.D.   On: 01/22/2022 00:52      Blood pressure (!) 161/98, pulse (!) 103,  temperature 99.3 F (37.4 C), resp. rate 16, height '5\' 8"'$  (1.727 m), weight 62.5 kg, SpO2 100 %.  Medical Problem List and Plan: 1. Functional deficits secondary to left craniotomy for left cystic vestibular schwannoma s/p resection on 01/21/2022  -patient may shower  -ELOS/Goals: 2 days, patient was unsure whether she wanted to come to rehab due to her $200/day copay. Discussed benefit of helping her to get to ModI level from her current Min-ModA and she was agreeable to come for 2 days 2.  Antithrombotics: -DVT/anticoagulation:  Pharmaceutical: Heparin  -antiplatelet therapy: aspirin 81 mg on hold for surgery 3. Pain Management: tylenol, Norco as needed 4. Mood/Behavior/Sleep: LCSW  -antipsychotic agents: n/a 5. Neuropsych/cognition: This patient is capable of making decisions on her own behalf. 6. Skin/Wound Care: Routine skin care checks  -monitor surgical incision  -tape left eyelid shut every night at bedtime, remove in the morning 7. Fluids/Electrolytes/Nutrition: Routine Is and Os and follow-up chemistries 8: CKD stage 4, s/p renal allograft: baseline creatinine>>1.7-2.0  -continue chronic Bactrim Monday/Wednesday/Friday  -continue Prograf, Myfortic 9: DM-2: carb-modified diet, CBGs qAC and qHS; continue Tradjenta  -start SSI 10: Glaucoma: continue netarsudil-latanoprost drops (medication from home), Alphagan drops, artificial tears. Discussed with Katharine Look about speaking with pharmacy to help get her home drops started since she prefers this.  11: Hypertension: increase Lopressor to '25mg'$  BID. 12: Hypothyroidism: continue Synthroid 13. Tachycardia: Increase lopressor to '25mg'$  BID.  I have personally performed a face to face diagnostic evaluation, including, but not limited to relevant history and physical exam findings, of this patient and developed relevant assessment and plan.  Additionally, I have reviewed and concur with the physician assistant's documentation above.  Risa Grill, PA  Izora Ribas, MD 01/23/2022

## 2022-01-23 NOTE — Discharge Summary (Signed)
Discharge Summary  Date of Admission: 01/21/2022  Date of Discharge: 01/23/22  Attending Physician: Emelda Brothers, MD  Hospital Course: Patient was admitted following a left retrosigmoid resection of a large vestibular schwannoma. They were recovered in PACU and transferred to 4N ICU. Post-op, did have new facial weakness on the left, house-brackmann 4, with some continued ataxia that was stable from preop. Post-op MRI showed good debulking, posot-oop changes without concerning findings. Seen by PT/OT who recommended CIR, discharged to CIR on 01/23/22, renal function was at baseline. Final path resulted on the day of discharge - vestibular schwannoma.   Neurologic exam at discharge:  Aox3, PERRL, EOMI, +facial weakness on the left HB4 with incomplete eye closure, CN5 intact, hearing decreased on the left, incision c/d/I, strength 5/5x4  Discharge diagnosis: Vestibular schwannoma  Judith Part, MD 01/23/22 11:51 AM

## 2022-01-23 NOTE — Progress Notes (Signed)
  Inpatient Rehabilitation Admissions Coordinator   I have insurance approval and CIR bed to admit her to today. I met with patient and son, Jerrye Beavers at bedside and they are in agreement. I have notified Dr Zada Finders and will make the arrangements. Acute team and TOC made aware.  Danne Baxter, RN, MSN Rehab Admissions Coordinator [phone] 01/23/2022 12:17 PM

## 2022-01-23 NOTE — PMR Pre-admission (Signed)
PMR Admission Coordinator Pre-Admission Assessment  Patient: Michelle Rubio is an 71 y.o., female MRN: [id number] DOB: [birth year] Height: $RemoveBeforeDE'5\' 8"'[id number]$  (172.7 cm) Weight: 66.7 kg  Insurance Information HMO:     PPO: yes     PCP:      IPA:      80/20:      OTHER:  PRIMARY: [employer]      Policy#: [id number]      Subscriber: pt CM Name: Tammy      Phone#: [phone]     Fax#: EPIC access Pre-Cert#: [id number] approved for 7 days      Employer:  Benefits:  Phone #: [phone]     Name: 10/24 Eff. Date: 04/01/21     Deduct: none      Out of Pocket Max: $3000      Life Max: none CIR: $200 co pay per day days 1 until 5      SNF: no copay days 1 until 20; $184 co pay per day days 21 until 100 Outpatient: $15 per visit     Co-Pay: visits per medical neccesity Home Health: 100%      Co-Pay: visits per medical neccesity DME: 80%     Co-Pay: 20% Providers: in network  SECONDARY: none  Financial Counselor:       Phone#:   [employer] Engineer, [employer]" for patients in Inpatient Rehabilitation Facilities with attached "Privacy Act [place] Records" was provided and verbally reviewed with: Patient and Family  Emergency Contact Information Contact Information     Name Relation Home Work Mobile   Page Son [phone]  [phone]   Arla, [place] [phone]        Current Medical History  Patient Admitting Diagnosis: Tumor resection  History of Present Illness: 71 year old female with history of  DM type 2, grave's disease, HLD, HTN, hypothyroidism and mitral valve prolapse, CKD, kidney transplant 2012  with history of ataxia and hearing loss. 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 Postoperative with left facial weakness with incomplete eye closure and House- Brackman 3 weakness.Plan for eye gtts during the day and tape lid closed at night.   Patient's medical record  from [facility] has been reviewed by the rehabilitation admission coordinator and physician.  Past Medical History  Past Medical History:  Diagnosis Date   Acute renal failure on dialysis ([place])    Body mass index (BMI) 20.0-20.9, adult    Chronic kidney disease    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   Diabetes mellitus without complication ([place])    type 2   Glaucoma    Gout    H/O Graves' disease 1980s   Hyperlipidemia    Hypertension    Hypothyroidism    Mitral valve prolapse    Has the patient had major surgery during 100 days prior to admission? Yes  Family History   family history includes Coronary artery disease in her mother; Heart disease in her father.  Current Medications  Current Facility-Administered Medications:    acetaminophen (TYLENOL) tablet 650 mg, 650 mg, Oral, Q4H PRN, 650 mg at 01/23/22 0859 **OR** acetaminophen (TYLENOL) suppository 650 mg, 650 mg, Rectal, Q4H PRN, Ostergard, Thomas A, MD   brimonidine (ALPHAGAN) 0.2 % ophthalmic solution 1 drop, 1 drop, Both Eyes, BID, Ostergard, Thomas A, MD, 1 drop at 01/23/22 0904   diclofenac Sodium (VOLTAREN) 1 % topical gel 2 g, 2  g, Topical, Daily PRN, Judith Part, MD   docusate sodium (COLACE) capsule 100 mg, 100 mg, Oral, BID, Ostergard, Joyice Faster, MD, 100 mg at 01/23/22 0900   ezetimibe (ZETIA) tablet 10 mg, 10 mg, Oral, Daily, Ostergard, Joyice Faster, MD, 10 mg at 01/23/22 0900   famotidine (PEPCID) tablet 10 mg, 10 mg, Oral, Daily, Ostergard, Joyice Faster, MD, 10 mg at 01/23/22 0900   heparin injection 5,000 Units, 5,000 Units, Subcutaneous, Q8H, Ostergard, Joyice Faster, MD, 5,000 Units at 01/23/22 0518   HYDROcodone-acetaminophen (NORCO/VICODIN) 5-325 MG per tablet 1 tablet, 1 tablet, Oral, Q4H PRN, Judith Part, MD   HYDROmorphone (DILAUDID) injection 0.5 mg, 0.5 mg, Intravenous, Q3H PRN, Judith Part, MD, 0.5 mg at 01/21/22 1545   labetalol (NORMODYNE) injection 10-40 mg, 10-40  mg, Intravenous, Q10 min PRN, Judith Part, MD, 20 mg at 01/22/22 2140   levothyroxine (SYNTHROID) tablet 75 mcg, 75 mcg, Oral, Daily, Judith Part, MD, 75 mcg at 01/23/22 0516   linagliptin (TRADJENTA) tablet 5 mg, 5 mg, Oral, Daily, Ostergard, Joyice Faster, MD, 5 mg at 01/23/22 0900   metoprolol tartrate (LOPRESSOR) tablet 12.5 mg, 12.5 mg, Oral, BID, Judith Part, MD, 12.5 mg at 01/23/22 0900   mycophenolate (MYFORTIC) EC tablet 360 mg, 360 mg, Oral, BID, Ostergard, Thomas A, MD, 360 mg at 01/23/22 0900   Netarsudil-Latanoprost 0.02-0.005 % SOLN 1 drop, 1 drop, Both Eyes, QHS, Ostergard, Thomas A, MD   ondansetron (ZOFRAN) tablet 4 mg, 4 mg, Oral, Q4H PRN **OR** ondansetron (ZOFRAN) injection 4 mg, 4 mg, Intravenous, Q4H PRN, Ostergard, Thomas A, MD   polyethylene glycol (MIRALAX / GLYCOLAX) packet 17 g, 17 g, Oral, Daily PRN, Judith Part, MD   polyvinyl alcohol (LIQUIFILM TEARS) 1.4 % ophthalmic solution 1 drop, 1 drop, Left Eye, Q4H while awake, Judith Part, MD, 1 drop at 01/23/22 0519   promethazine (PHENERGAN) tablet 12.5-25 mg, 12.5-25 mg, Oral, Q4H PRN, Judith Part, MD, 25 mg at 01/21/22 2153   sulfamethoxazole-trimethoprim (BACTRIM) 400-80 MG per tablet 1 tablet, 1 tablet, Oral, Q M,W,F, Ostergard, Joyice Faster, MD, 1 tablet at 01/21/22 1604   tacrolimus (PROGRAF) capsule 2 mg, 2 mg, Oral, BID, Ostergard, Thomas A, MD, 2 mg at 01/23/22 0900   zolpidem (AMBIEN) tablet 5 mg, 5 mg, Oral, QHS PRN, Judith Part, MD  Patients Current Diet:  Diet Order             Diet - low sodium heart healthy           Diet regular Room service appropriate? Yes; Fluid consistency: Thin  Diet effective now                  Precautions / Restrictions Precautions Precautions: Fall Restrictions Weight Bearing Restrictions: No   Has the patient had 2 or more falls or a fall with injury in the past year? No  Prior Activity Level Limited Community  (1-2x/wk): Mod I with RW, driving  Prior Functional Level Self Care: Did the patient need help bathing, dressing, using the toilet or eating? Independent  Indoor Mobility: Did the patient need assistance with walking from room to room (with or without device)? Independent  Stairs: Did the patient need assistance with internal or external stairs (with or without device)? Independent  Functional Cognition: Did the patient need help planning regular tasks such as shopping or remembering to take medications? Independent  Patient Information Are you of Hispanic, Latino/a,or Spanish origin?: A.  No, not of Hispanic, Latino/a, or Spanish origin What is your race?: B. Black or African American Do you need or want an interpreter to communicate with a doctor or health care staff?: 0. No  Patient's Response To:  Health Literacy and Transportation Is the patient able to respond to health literacy and transportation needs?: Yes Health Literacy - How often do you need to have someone help you when you read instructions, pamphlets, or other written material from your doctor or pharmacy?: Never In the past 12 months, has lack of transportation kept you from medical appointments or from getting medications?: No In the past 12 months, has lack of transportation kept you from meetings, work, or from getting things needed for daily living?: No  Home Assistive Devices / [place]: Rollator (4 wheels), Cane - single point  Prior Device Use: Indicate devices/aids used by the patient prior to current illness, exacerbation or injury? Walker  Current Functional Level Cognition  Overall Cognitive Status: Within Functional Limits for tasks assessed Orientation Level: Oriented X4 General Comments: Pt very sweet, motivated to participate, appreciative of session. Pt follows commands throughout session.    Extremity Assessment (includes Sensation/Coordination)  Upper Extremity Assessment: Defer to  OT evaluation  Lower Extremity Assessment: Generalized weakness (with mild incoordination, reports sensation intact)    ADLs  Overall ADL's : Needs assistance/impaired Eating/Feeding: Set up, Bed level Grooming: Set up, Supervision/safety, Sitting Grooming Details (indicate cue type and reason): EOB Upper Body Bathing: Set up, Supervision/ safety, Sitting Upper Body Bathing Details (indicate cue type and reason): EOB Lower Body Bathing: Minimal assistance, Sit to/from stand Upper Body Dressing : Supervision/safety, Set up, Sitting Upper Body Dressing Details (indicate cue type and reason): EOB Lower Body Dressing: Minimal assistance, Sit to/from stand Toilet Transfer: Minimal assistance, +2 for safety/equipment, Rolling walker (2 wheels) Toilet Transfer Details (indicate cue type and reason): simulated bed>hallway walking>bed Toileting- Clothing Manipulation and Hygiene: Minimal assistance    Mobility  Overal bed mobility: Modified Independent Bed Mobility: Supine to Sit Supine to sit: Modified independent (Device/Increase time), HOB elevated Sit to supine: Min guard General bed mobility comments: increased time, min G for safety    Transfers  Overall transfer level: Needs assistance Equipment used: Rolling walker (2 wheels) Transfers: Sit to/from Stand Sit to Stand: Min guard General transfer comment: ongoing education re: safe hand placement during STS transfer with RW from EOB, as well as hand placement when transferring STS from toilet with use of grab bar    Ambulation / Gait / Stairs / Wheelchair Mobility  Ambulation/Gait Ambulation/Gait assistance: Min assist, Min guard, Mod assist Gait Distance (Feet): 110 Feet (+ 10 ft + 110 ft) Assistive device: Rolling walker (2 wheels), None Gait Pattern/deviations: Narrow base of support, Scissoring, Decreased step length - right, Decreased step length - left, Decreased stride length, Decreased dorsiflexion - right, Decreased  dorsiflexion - left General Gait Details: Pt initiall ambulates in room, bathroom & hallway with RW & CGA<>min assist. Pt demonstrates narrow BOS & intermittent scissoring gait with RLE crossing midline; pt requires min assist for intermittent LOB. PT also provides cuing for scanning gaze vs staring at single point; pt also requires cuing for forward vs downward gaze. Pt then ambulated without AD with min<>mod assist with focus on dynamic balance with pt requiring up to mod assist 2/2 decreased balance. Gait velocity: decreased Gait velocity interpretation: <1.31 ft/sec, indicative of household ambulator    Posture / Balance Dynamic Sitting Balance Sitting balance - Comments: supervision  sitting on toilet without LOB Balance Overall balance assessment: Needs assistance, History of Falls Sitting-balance support: Feet supported, No upper extremity supported Sitting balance-Leahy Scale: Fair Sitting balance - Comments: supervision sitting on toilet without LOB Standing balance support: No upper extremity supported, During functional activity Standing balance-Leahy Scale: Poor Standing balance comment: dependent on BUE support and up to modA to correct LOB, pt did stand at sink to brush teeth, noted tremors and UE impaired coordination during putting on cap to toothepaste and toothpaste on tooth brush    Special needs/care consideration Fall precautions   Previous Home Environment  Living Arrangements: Alone  Lives With: Alone Available Help at Discharge: Family, Available 24 hours/day (family to rotate schedule to  provide supervision) Type of Home: House Home Layout: One level Home Access: Stairs to enter [employer] of Steps: 1 Bathroom Shower/Tub: Public librarian, Architectural technologist: Programmer, systems: Yes Home Care Services: Yes Type of Home Care Services: [place] (if known): [facility]  Discharge Living Setting Plans for Discharge  Living Setting: Patient's home, Alone Type of Home at Discharge: House Discharge Home Layout: One level Discharge Home Access: Stairs to enter Entrance Stairs-Rails: None Entrance Stairs-Number of Steps: 1 Discharge Bathroom Shower/Tub: Tub/shower unit Discharge Bathroom Toilet: Standard Discharge Bathroom Accessibility: Yes How Accessible: Accessible via walker Does the patient have any problems obtaining your medications?: No  Social/Family/Support Systems Patient Roles: Parent Contact Information: son, Jerrye Beavers Anticipated Caregiver: family Anticipated Caregiver's Contact Information: see contacts Ability/Limitations of Caregiver: family to rotate supervision Caregiver Availability: 24/7 Discharge Plan Discussed with Primary Caregiver: Yes Is Caregiver In Agreement with Plan?: Yes Does Caregiver/Family have Issues with Lodging/Transportation while Pt is in Rehab?: No Goals Patient/Family Goal for Rehab: supervision with PT and OT Expected length of stay: [facility] 7 to 10 days Pt/Family Agrees to Admission and willing to participate: Yes Program Orientation Provided & Reviewed with Pt/Caregiver Including Roles  & Responsibilities: Yes  Decrease burden of Care through IP rehab admission: n/a  Possible need for SNF placement upon discharge: not anticipated  Patient Condition: I have reviewed medical records from [facility], spoken with  patient, son, and family member. I met with patient at the bedside for inpatient rehabilitation assessment.  Patient will benefit from ongoing PT and OT, can actively participate in 3 hours of therapy a day 5 days of the week, and can make measurable gains during the admission.  Patient will also benefit from the coordinated team approach during an Inpatient Acute Rehabilitation admission.  The patient will receive intensive therapy as well as Rehabilitation physician, nursing, social worker, and care management interventions.  Due to bladder  management, bowel management, safety, skin/wound care, disease management, medication administration, pain management, and patient education the patient requires 24 hour a day rehabilitation nursing.  The patient is currently min to mod assist overall with mobility and basic ADLs.  Discharge setting and therapy post discharge at home with home health is anticipated.  Patient has agreed to participate in the Acute Inpatient Rehabilitation Program and will admit today.  Preadmission Screen Completed By:  Cleatrice Burke, 01/23/2022 12:31 PM ______________________________________________________________________   Discussed status with Dr. Ranell Patrick on 01/23/22 at 1218 and received approval for admission today.  Admission Coordinator:  Cleatrice Burke, RN, time 1218 Date 01/23/22   Assessment/Plan: Diagnosis: large left cystic vestibular schwannoma s/p craniotomy Does the need for close, 24 hr/day Medical supervision in concert with the patient's rehab needs make it unreasonable for this  patient to be served in a less intensive setting? Yes Co-Morbidities requiring supervision/potential complications: facial droop, acute renal failure on dialysis, CKD due to HTN s/p kidney transplant, glaucoma, gout Due to bladder management, bowel management, safety, skin/wound care, disease management, medication administration, pain management, and patient education, does the patient require 24 hr/day rehab nursing? Yes Does the patient require coordinated care of a physician, rehab nurse, PT, OT, and SLP to address physical and functional deficits in the context of the above medical diagnosis(es)? Yes Addressing deficits in the following areas: balance, endurance, locomotion, strength, transferring, bowel/bladder control, bathing, dressing, feeding, grooming, toileting, and psychosocial support Can the patient actively participate in an intensive therapy program of at least 3 hrs of therapy 5 days a  week? Yes The potential for patient to make measurable gains while on inpatient rehab is excellent Anticipated functional outcomes upon discharge from inpatient rehab: modified independent PT, modified independent OT, independent SLP Estimated rehab length of stay to reach the above functional goals is: 2 days Anticipated discharge destination: Home 10. Overall Rehab/Functional Prognosis: excellent   MD Signature: Leeroy Cha, MD

## 2022-01-23 NOTE — Progress Notes (Signed)
Izora Ribas, MD  Physician Physical Medicine and Rehabilitation PMR Pre-admission     Signed Date of Service:  01/23/2022  9:04 AM  Related encounter: Admission (Discharged) from 01/21/2022 in [place]      PMR Admission Coordinator Pre-Admission Assessment   Patient: Michelle Rubio is an 71 y.o., female MRN: [id number] DOB: 1950-09-16 Height: 5\' 8"  (172.7 cm) Weight: 66.7 kg   Insurance Information HMO:     PPO: yes     PCP:      IPA:      80/20:      OTHER:  PRIMARY: [employer]      Policy#: [id number]      Subscriber: pt CM Name: Tammy      Phone#: [phone]     Fax#: EPIC access Pre-Cert#: [id number] approved for 7 days      Employer:  Benefits:  Phone #: [phone]     Name: 10/24 Eff. Date: 04/01/21     Deduct: none      Out of Pocket Max: $3000      Life Max: none CIR: $200 co pay per day days 1 until 5      SNF: no copay days 1 until 20; $184 co pay per day days 21 until 100 Outpatient: $15 per visit     Co-Pay: visits per medical neccesity Home Health: 100%      Co-Pay: visits per medical neccesity DME: 80%     Co-Pay: 20% Providers: in network  SECONDARY: none   Financial Counselor:       Phone#:    The Engineer, petroleum" for patients in Inpatient Rehabilitation Facilities with attached "Privacy Act [place] Records" was provided and verbally reviewed with: Patient and Family   Emergency Contact Information Contact Information       Name Relation Home Work Mobile    [place] Son [phone]   [place] Son [phone]             Current Medical History  Patient Admitting Diagnosis: Tumor resection   History of Present Illness: 71 year old female with history of  DM type 2, grave's disease, HLD, HTN, hypothyroidism and mitral valve prolapse, CKD, kidney transplant 2012  with history of ataxia and hearing loss. 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 Postoperative with left facial weakness with incomplete eye closure and House- Brackman 3 weakness.Plan for eye gtts during the day and tape lid closed at night.    Patient's medical record from [facility] has been reviewed by the rehabilitation admission coordinator and physician.   Past Medical History      Past Medical History:  Diagnosis Date   Acute renal failure on dialysis ([place])     Body mass index (BMI) 20.0-20.9, adult     Chronic kidney disease      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   Diabetes mellitus without complication ([place])      type 2   Glaucoma     Gout     H/O Graves' disease 1980s   Hyperlipidemia     Hypertension     Hypothyroidism     Mitral valve prolapse      Has the patient had major surgery during 100 days prior to admission? Yes   Family History   family history includes Coronary  artery disease in her mother; Heart disease in her father.   Current Medications   Current Facility-Administered Medications:    acetaminophen (TYLENOL) tablet 650 mg, 650 mg, Oral, Q4H PRN, 650 mg at 01/23/22 0859 **OR** acetaminophen (TYLENOL) suppository 650 mg, 650 mg, Rectal, Q4H PRN, Judith Part, MD   brimonidine (ALPHAGAN) 0.2 % ophthalmic solution 1 drop, 1 drop, Both Eyes, BID, Ostergard, Joyice Faster, MD, 1 drop at 01/23/22 1007   diclofenac Sodium (VOLTAREN) 1 % topical gel 2 g, 2 g, Topical, Daily PRN, Judith Part, MD   docusate sodium (COLACE) capsule 100 mg, 100 mg, Oral, BID, Ostergard, Joyice Faster, MD, 100 mg at 01/23/22 0900   ezetimibe (ZETIA) tablet 10 mg, 10 mg, Oral, Daily, Ostergard, Thomas A, MD, 10 mg at 01/23/22 0900   famotidine (PEPCID) tablet 10 mg, 10 mg, Oral, Daily, Ostergard, Thomas A, MD, 10 mg at 01/23/22 0900   heparin injection 5,000 Units, 5,000 Units, Subcutaneous, Q8H, Ostergard, Joyice Faster, MD, 5,000 Units at  01/23/22 0518   HYDROcodone-acetaminophen (NORCO/VICODIN) 5-325 MG per tablet 1 tablet, 1 tablet, Oral, Q4H PRN, Judith Part, MD   HYDROmorphone (DILAUDID) injection 0.5 mg, 0.5 mg, Intravenous, Q3H PRN, Judith Part, MD, 0.5 mg at 01/21/22 1545   labetalol (NORMODYNE) injection 10-40 mg, 10-40 mg, Intravenous, Q10 min PRN, Judith Part, MD, 20 mg at 01/22/22 2140   levothyroxine (SYNTHROID) tablet 75 mcg, 75 mcg, Oral, Daily, Judith Part, MD, 75 mcg at 01/23/22 0516   linagliptin (TRADJENTA) tablet 5 mg, 5 mg, Oral, Daily, Ostergard, Thomas A, MD, 5 mg at 01/23/22 0900   metoprolol tartrate (LOPRESSOR) tablet 12.5 mg, 12.5 mg, Oral, BID, Ostergard, Joyice Faster, MD, 12.5 mg at 01/23/22 0900   mycophenolate (MYFORTIC) EC tablet 360 mg, 360 mg, Oral, BID, Ostergard, Thomas A, MD, 360 mg at 01/23/22 0900   Netarsudil-Latanoprost 0.02-0.005 % SOLN 1 drop, 1 drop, Both Eyes, QHS, Ostergard, Thomas A, MD   ondansetron (ZOFRAN) tablet 4 mg, 4 mg, Oral, Q4H PRN **OR** ondansetron (ZOFRAN) injection 4 mg, 4 mg, Intravenous, Q4H PRN, Ostergard, Thomas A, MD   polyethylene glycol (MIRALAX / GLYCOLAX) packet 17 g, 17 g, Oral, Daily PRN, Judith Part, MD   polyvinyl alcohol (LIQUIFILM TEARS) 1.4 % ophthalmic solution 1 drop, 1 drop, Left Eye, Q4H while awake, Judith Part, MD, 1 drop at 01/23/22 0519   promethazine (PHENERGAN) tablet 12.5-25 mg, 12.5-25 mg, Oral, Q4H PRN, Judith Part, MD, 25 mg at 01/21/22 2153   sulfamethoxazole-trimethoprim (BACTRIM) 400-80 MG per tablet 1 tablet, 1 tablet, Oral, Q M,W,F, Ostergard, Joyice Faster, MD, 1 tablet at 01/21/22 1604   tacrolimus (PROGRAF) capsule 2 mg, 2 mg, Oral, BID, Ostergard, Thomas A, MD, 2 mg at 01/23/22 0900   zolpidem (AMBIEN) tablet 5 mg, 5 mg, Oral, QHS PRN, Judith Part, MD   Patients Current Diet:  Diet Order                  Diet - low sodium heart healthy             Diet regular Room service  appropriate? Yes; Fluid consistency: Thin  Diet effective now                       Precautions / Restrictions Precautions Precautions: Fall Restrictions Weight Bearing Restrictions: No    Has the patient had 2 or more falls or a fall with injury in  the past year? No   Prior Activity Level Limited Community (1-2x/wk): Mod I with RW, driving   Prior Functional Level Self Care: Did the patient need help bathing, dressing, using the toilet or eating? Independent   Indoor Mobility: Did the patient need assistance with walking from room to room (with or without device)? Independent   Stairs: Did the patient need assistance with internal or external stairs (with or without device)? Independent   Functional Cognition: Did the patient need help planning regular tasks such as shopping or remembering to take medications? Independent   Patient Information Are you of Hispanic, Latino/a,or Spanish origin?: A. No, not of Hispanic, Latino/a, or Spanish origin What is your race?: B. Black or African American Do you need or want an interpreter to communicate with a doctor or health care staff?: 0. No   Patient's Response To:  Health Literacy and Transportation Is the patient able to respond to health literacy and transportation needs?: Yes Health Literacy - How often do you need to have someone help you when you read instructions, pamphlets, or other written material from your doctor or pharmacy?: Never In the past 12 months, has lack of transportation kept you from medical appointments or from getting medications?: No In the past 12 months, has lack of transportation kept you from meetings, work, or from getting things needed for daily living?: No   Home Assistive Devices / [place]: Rollator (4 wheels), Cane - single point   Prior Device Use: Indicate devices/aids used by the patient prior to current illness, exacerbation or injury? Walker   Current Functional  Level Cognition   Overall Cognitive Status: Within Functional Limits for tasks assessed Orientation Level: Oriented X4 General Comments: Pt very sweet, motivated to participate, appreciative of session. Pt follows commands throughout session.    Extremity Assessment (includes Sensation/Coordination)   Upper Extremity Assessment: Defer to OT evaluation  Lower Extremity Assessment: Generalized weakness (with mild incoordination, reports sensation intact)     ADLs   Overall ADL's : Needs assistance/impaired Eating/Feeding: Set up, Bed level Grooming: Set up, Supervision/safety, Sitting Grooming Details (indicate cue type and reason): EOB Upper Body Bathing: Set up, Supervision/ safety, Sitting Upper Body Bathing Details (indicate cue type and reason): EOB Lower Body Bathing: Minimal assistance, Sit to/from stand Upper Body Dressing : Supervision/safety, Set up, Sitting Upper Body Dressing Details (indicate cue type and reason): EOB Lower Body Dressing: Minimal assistance, Sit to/from stand Toilet Transfer: Minimal assistance, +2 for safety/equipment, Rolling walker (2 wheels) Toilet Transfer Details (indicate cue type and reason): simulated bed>hallway walking>bed Toileting- Clothing Manipulation and Hygiene: Minimal assistance     Mobility   Overal bed mobility: Modified Independent Bed Mobility: Supine to Sit Supine to sit: Modified independent (Device/Increase time), HOB elevated Sit to supine: Min guard General bed mobility comments: increased time, min G for safety     Transfers   Overall transfer level: Needs assistance Equipment used: Rolling walker (2 wheels) Transfers: Sit to/from Stand Sit to Stand: Min guard General transfer comment: ongoing education re: safe hand placement during STS transfer with RW from EOB, as well as hand placement when transferring STS from toilet with use of grab bar     Ambulation / Gait / Stairs / Wheelchair Mobility    Ambulation/Gait Ambulation/Gait assistance: Min assist, Min guard, Mod assist Gait Distance (Feet): 110 Feet (+ 10 ft + 110 ft) Assistive device: Rolling walker (2 wheels), None Gait Pattern/deviations: Narrow base of support, Scissoring, Decreased step length -  right, Decreased step length - left, Decreased stride length, Decreased dorsiflexion - right, Decreased dorsiflexion - left General Gait Details: Pt initiall ambulates in room, bathroom & hallway with RW & CGA<>min assist. Pt demonstrates narrow BOS & intermittent scissoring gait with RLE crossing midline; pt requires min assist for intermittent LOB. PT also provides cuing for scanning gaze vs staring at single point; pt also requires cuing for forward vs downward gaze. Pt then ambulated without AD with min<>mod assist with focus on dynamic balance with pt requiring up to mod assist 2/2 decreased balance. Gait velocity: decreased Gait velocity interpretation: <1.31 ft/sec, indicative of household ambulator     Posture / Balance Dynamic Sitting Balance Sitting balance - Comments: supervision sitting on toilet without LOB Balance Overall balance assessment: Needs assistance, History of Falls Sitting-balance support: Feet supported, No upper extremity supported Sitting balance-Leahy Scale: Fair Sitting balance - Comments: supervision sitting on toilet without LOB Standing balance support: No upper extremity supported, During functional activity Standing balance-Leahy Scale: Poor Standing balance comment: dependent on BUE support and up to modA to correct LOB, pt did stand at sink to brush teeth, noted tremors and UE impaired coordination during putting on cap to toothepaste and toothpaste on tooth brush     Special needs/care consideration Fall precautions    Previous Home Environment  Living Arrangements: Alone  Lives With: Alone Available Help at Discharge: Family, Available 24 hours/day (family to rotate schedule to  provide  supervision) Type of Home: House Home Layout: One level Home Access: Stairs to enter [employer] of Steps: 1 Bathroom Shower/Tub: Public librarian, Architectural technologist: Programmer, systems: Yes Home Care Services: Yes Type of Home Care Services: [place] (if known): [facility]   Discharge Living Setting Plans for Discharge Living Setting: Patient's home, Alone Type of Home at Discharge: House Discharge Home Layout: One level Discharge Home Access: Stairs to enter Entrance Stairs-Rails: None Entrance Stairs-Number of Steps: 1 Discharge Bathroom Shower/Tub: Tub/shower unit Discharge Bathroom Toilet: Standard Discharge Bathroom Accessibility: Yes How Accessible: Accessible via walker Does the patient have any problems obtaining your medications?: No   Social/Family/Support Systems Patient Roles: Parent Contact Information: son, Jerrye Beavers Anticipated Caregiver: family Anticipated Caregiver's Contact Information: see contacts Ability/Limitations of Caregiver: family to rotate supervision Caregiver Availability: 24/7 Discharge Plan Discussed with Primary Caregiver: Yes Is Caregiver In Agreement with Plan?: Yes Does Caregiver/Family have Issues with Lodging/Transportation while Pt is in Rehab?: No Goals Patient/Family Goal for Rehab: supervision with PT and OT Expected length of stay: ELOS 7 to 10 days Pt/Family Agrees to Admission and willing to participate: Yes Program Orientation Provided & Reviewed with Pt/Caregiver Including Roles  & Responsibilities: Yes   Decrease burden of Care through IP rehab admission: n/a   Possible need for SNF placement upon discharge: not anticipated   Patient Condition: I have reviewed medical records from [facility], spoken with  patient, son, and family member. I met with patient at the bedside for inpatient rehabilitation assessment.  Patient will benefit from ongoing PT and OT, can actively  participate in 3 hours of therapy a day 5 days of the week, and can make measurable gains during the admission.  Patient will also benefit from the coordinated team approach during an Inpatient Acute Rehabilitation admission.  The patient will receive intensive therapy as well as Rehabilitation physician, nursing, social worker, and care management interventions.  Due to bladder management, bowel management, safety, skin/wound care, disease management, medication administration, pain management, and  patient education the patient requires 24 hour a day rehabilitation nursing.  The patient is currently min to mod assist overall with mobility and basic ADLs.  Discharge setting and therapy post discharge at home with home health is anticipated.  Patient has agreed to participate in the Acute Inpatient Rehabilitation Program and will admit today.   Preadmission Screen Completed By:  Cleatrice Burke, 01/23/2022 12:31 PM ______________________________________________________________________   Discussed status with Dr. Ranell Patrick on 01/23/22 at 1218 and received approval for admission today.   Admission Coordinator:  Cleatrice Burke, RN, time 1218 Date 01/23/22    Assessment/Plan: Diagnosis: large left cystic vestibular schwannoma s/p craniotomy Does the need for close, 24 hr/day Medical supervision in concert with the patient's rehab needs make it unreasonable for this patient to be served in a less intensive setting? Yes Co-Morbidities requiring supervision/potential complications: facial droop, acute renal failure on dialysis, CKD due to HTN s/p kidney transplant, glaucoma, gout Due to bladder management, bowel management, safety, skin/wound care, disease management, medication administration, pain management, and patient education, does the patient require 24 hr/day rehab nursing? Yes Does the patient require coordinated care of a physician, rehab nurse, PT, OT, and SLP to address physical and  functional deficits in the context of the above medical diagnosis(es)? Yes Addressing deficits in the following areas: balance, endurance, locomotion, strength, transferring, bowel/bladder control, bathing, dressing, feeding, grooming, toileting, and psychosocial support Can the patient actively participate in an intensive therapy program of at least 3 hrs of therapy 5 days a week? Yes The potential for patient to make measurable gains while on inpatient rehab is excellent Anticipated functional outcomes upon discharge from inpatient rehab: modified independent PT, modified independent OT, independent SLP Estimated rehab length of stay to reach the above functional goals is: 2 days Anticipated discharge destination: Home 10. Overall Rehab/Functional Prognosis: excellent     MD Signature: Leeroy Cha, MD         Revision History                                          Note Details  Author Izora Ribas, MD File Time 01/23/2022 12:53 PM  Author Type Physician Status Signed  Last Editor Izora Ribas, MD Service Physical Medicine and [place] # [id number] [place] Date 01/23/2022

## 2022-01-23 NOTE — Progress Notes (Signed)
Patient ID: Michelle Rubio, female   DOB: [birth year], 71 y.o.   MRN: [id number]  INPATIENT REHABILITATION ADMISSION NOTE   Arrival Method: Bed     Mental Orientation: x4   Assessment: see flowsheet   Skin: see flowsheet   IV'S: x2   Pain: none reported   Tubes and Drains: n/a   Safety Measures: in place   Vital Signs: see flowsheet   Height and Weight: see flowsheet   Rehab Orientation: completed   Family: at bedside  Dorthula Nettles, [place], BSN, [place], [place]

## 2022-01-23 NOTE — Progress Notes (Signed)
Mobility Specialist - Progress Note   01/23/22 1436  Mobility  Activity Ambulated with assistance to bathroom  Level of Assistance Standby assist, set-up cues, supervision of patient - no hands on  Assistive Device Front wheel walker  Distance Ambulated (ft) 10 ft  Activity Response Tolerated well  $Mobility charge 1 Mobility    Pt received in recliner requesting to use BR. Left in recliner w/ call bell in reach and all needs met.   Paulla Dolly Mobility Specialist

## 2022-01-23 NOTE — Progress Notes (Signed)
Physical Therapy Treatment Patient Details Name: Michelle Rubio MRN: [id number] DOB: 09-Aug-1950 Today's Date: 01/23/2022   History of Present Illness 71 y.o. female presenting 10/23 for Left retrosigmoid craniotomy for resection of left cystic vestibular schwannoma. PMH includes: CKD s/p kidney transplant in 2012, DM II, glaucoma, gout, HLD, HTN, and hypothyroidism.    PT Comments    Pt seen for PT tx with pt agreeable. Pt is able to ambulate with RW & CGA with intermittent LOB 2/2 impaired gait, requiring min assist to correct. Pt ambulated without AD with min<>Mod assist with focus in high level dynamic balance & gait pattern; pt continues to demonstrate scissoring at times. Pt performed 10x STS for BLE strengthening. Pt is making good gains in regards to functional progress but would benefit from ongoing PT services to address strengthening, balance, gait with LRAD & awareness to decrease fall risk.    Recommendations for follow up therapy are one component of a multi-disciplinary discharge planning process, led by the attending physician.  Recommendations may be updated based on patient status, additional functional criteria and insurance authorization.  Follow Up Recommendations  Acute inpatient rehab (3hours/day)     Assistance Recommended at Discharge Frequent or constant Supervision/Assistance  Patient can return home with the following A little help with walking and/or transfers;A little help with bathing/dressing/bathroom;Assistance with cooking/housework;Direct supervision/assist for medications management;Direct supervision/assist for financial management;Assist for transportation;Help with stairs or ramp for entrance   Equipment Recommendations  Rolling walker (2 wheels)    Recommendations for Other Services Rehab consult     Precautions / Restrictions Precautions Precautions: Fall Restrictions Weight Bearing Restrictions: No     Mobility  Bed Mobility Overal bed  mobility: Modified Independent Bed Mobility: Supine to Sit     Supine to sit: Modified independent (Device/Increase time), HOB elevated          Transfers Overall transfer level: Needs assistance Equipment used: Rolling walker (2 wheels) Transfers: Sit to/from Stand Sit to Stand: Min guard           General transfer comment: ongoing education re: safe hand placement during STS transfer with RW from EOB, as well as hand placement when transferring STS from toilet with use of grab bar    Ambulation/Gait Ambulation/Gait assistance: Min assist, Min guard, Mod assist Gait Distance (Feet): 110 Feet (+ 10 ft + 110 ft) Assistive device: Rolling walker (2 wheels), None Gait Pattern/deviations: Narrow base of support, Scissoring, Decreased step length - right, Decreased step length - left, Decreased stride length, Decreased dorsiflexion - right, Decreased dorsiflexion - left Gait velocity: decreased     General Gait Details: Pt initiall ambulates in room, bathroom & hallway with RW & CGA<>min assist. Pt demonstrates narrow BOS & intermittent scissoring gait with RLE crossing midline; pt requires min assist for intermittent LOB. PT also provides cuing for scanning gaze vs staring at single point; pt also requires cuing for forward vs downward gaze. Pt then ambulated without AD with min<>mod assist with focus on dynamic balance with pt requiring up to mod assist 2/2 decreased balance.   Stairs             Wheelchair Mobility    Modified Rankin (Stroke Patients Only)       Balance Overall balance assessment: Needs assistance, History of Falls Sitting-balance support: Feet supported, No upper extremity supported Sitting balance-Leahy Scale: Fair Sitting balance - Comments: supervision sitting on toilet without LOB   Standing balance support: No upper extremity supported, During functional activity  Standing balance-Leahy Scale: Poor                               Cognition Arousal/Alertness: Awake/alert Behavior During Therapy: Anxious Overall Cognitive Status: Within Functional Limits for tasks assessed                                 General Comments: Pt very sweet, motivated to participate, appreciative of session. Pt follows commands throughout session.        Exercises Other Exercises Other Exercises: Pt performed 10x STS from recliner without BUE support with mod fade to CGA with task focusing on BLE strengthening. Pt requries cuing for BLE placement & overall technique, min cuing provided with good return demo of improving eccentric control during stand>sit.    General Comments General comments (skin integrity, edema, etc.): Pt with continent void & performs peri hygiene without assistance. Pt requests & engages in grooming tasks (washing face, brushing teeth) while standing at sink with CGA<>Min assist.      Pertinent Vitals/Pain Pain Assessment Pain Assessment: No/denies pain    Home Living                          Prior Function            PT Goals (current goals can now be found in the care plan section) Acute Rehab PT Goals Patient Stated Goal: return home and not fall PT Goal Formulation: With patient Time For Goal Achievement: 02/04/22 Potential to Achieve Goals: Good Progress towards PT goals: Progressing toward goals    Frequency    Min 4X/week      PT Plan Current plan remains appropriate    Co-evaluation              AM-PAC PT "6 Clicks" Mobility   Outcome Measure  Help needed turning from your back to your side while in a flat bed without using bedrails?: None Help needed moving from lying on your back to sitting on the side of a flat bed without using bedrails?: None Help needed moving to and from a bed to a chair (including a wheelchair)?: A Little Help needed standing up from a chair using your arms (e.g., wheelchair or bedside chair)?: A Little Help needed to walk in  hospital room?: A Little Help needed climbing 3-5 steps with a railing? : A Little 6 Click Score: 20    End of Session Equipment Utilized During Treatment: Gait belt Activity Tolerance: Patient tolerated treatment well Patient left: in chair;with chair alarm set;with call bell/phone within reach Nurse Communication: Mobility status PT Visit Diagnosis: Other abnormalities of gait and mobility (R26.89);Ataxic gait (R26.0);Repeated falls (R29.6)     Time: 1751-0258 PT Time Calculation (min) (ACUTE ONLY): 25 min  Charges:  $Therapeutic Activity: 8-22 mins $Neuromuscular Re-education: 8-22 mins                     Lavone Nian, PT, DPT 01/23/22, 12:28 PM   Waunita Schooner 01/23/2022, 12:26 PM

## 2022-01-23 NOTE — Progress Notes (Signed)
Inpatient Rehabilitation Admission Medication Review by a Pharmacist  A complete drug regimen review was completed for this patient to identify any potential clinically significant medication issues.  High Risk Drug Classes Is patient taking? Indication by Medication  Antipsychotic Yes Compazine PO/IM/PR - nausea  Anticoagulant Yes Heparin-VTE px  Antibiotic Yes Bactrim-PJP px  Opioid Yes Norco- prn moderate pain  Antiplatelet No   Hypoglycemics/insulin Yes Linagliptin, SSI-DM  Vasoactive Medication Yes Metoprolol-HTN  Chemotherapy No   Other Yes Tactrolimus-immunosuppresnat Mycophenolic acid-Immunosuppresant Levothyroxine-hypothyroidism Zetia-HLD Diclofenac-Pain Brimonidine-Glaucoma Neutrasedil-lantanaprost-glaumcoma Robaxin- muscle relaxant Trazodone- prn sleep Benadryl - prn itching     Type of Medication Issue Identified Description of Issue Recommendation(s)  Drug Interaction(s) (clinically significant)     Duplicate Therapy     Allergy     No Medication Administration End Date     Incorrect Dose     Additional Drug Therapy Needed     Significant med changes from prior encounter (inform family/care partners about these prior to discharge).    Other       Clinically significant medication issues were identified that warrant physician communication and completion of prescribed/recommended actions by midnight of the next day:  No  Name of provider notified for urgent issues identified:   Provider Method of Notification:     Pharmacist comments:   Time spent performing this drug regimen review (minutes):  15  Dwayne A. Levada Dy, PharmD, BCPS, FNKF Clinical Pharmacist [place] Please utilize Amion for appropriate phone number to reach the unit pharmacist ([place])  01/23/2022 12:39 PM

## 2022-01-24 ENCOUNTER — Inpatient Hospital Stay (HOSPITAL_COMMUNITY): Payer: PPO

## 2022-01-24 ENCOUNTER — Encounter (HOSPITAL_COMMUNITY): Payer: Self-pay | Admitting: Neurological Surgery

## 2022-01-24 DIAGNOSIS — D333 Benign neoplasm of cranial nerves: Secondary | ICD-10-CM | POA: Diagnosis not present

## 2022-01-24 LAB — COMPREHENSIVE METABOLIC PANEL
ALT: 10 U/L (ref 0–44)
AST: 25 U/L (ref 15–41)
Albumin: 2.9 g/dL — ABNORMAL LOW (ref 3.5–5.0)
Alkaline Phosphatase: 48 U/L (ref 38–126)
Anion gap: 11 (ref 5–15)
BUN: 36 mg/dL — ABNORMAL HIGH (ref 8–23)
CO2: 24 mmol/L (ref 22–32)
Calcium: 8.5 mg/dL — ABNORMAL LOW (ref 8.9–10.3)
Chloride: 102 mmol/L (ref 98–111)
Creatinine, Ser: 2.2 mg/dL — ABNORMAL HIGH (ref 0.44–1.00)
GFR, Estimated: 24 mL/min — ABNORMAL LOW (ref >60)
Glucose, Bld: 134 mg/dL — ABNORMAL HIGH (ref 70–99)
Potassium: 4 mmol/L (ref 3.5–5.1)
Sodium: 137 mmol/L (ref 135–145)
Total Bilirubin: 0.4 mg/dL (ref 0.3–1.2)
Total Protein: 7.2 g/dL (ref 6.5–8.1)

## 2022-01-24 LAB — MAGNESIUM: Magnesium: 1.8 mg/dL (ref 1.7–2.4)

## 2022-01-24 LAB — URINALYSIS, ROUTINE W REFLEX MICROSCOPIC
Bacteria, UA: NONE SEEN
Bilirubin Urine: NEGATIVE
Glucose, UA: NEGATIVE mg/dL
Ketones, ur: NEGATIVE mg/dL
Nitrite: NEGATIVE
Protein, ur: >=300 mg/dL — AB
Specific Gravity, Urine: 1.015 (ref 1.005–1.030)
pH: 5 (ref 5.0–8.0)

## 2022-01-24 LAB — CBC WITH DIFFERENTIAL/PLATELET
Abs Immature Granulocytes: 0.13 10*3/uL — ABNORMAL HIGH (ref 0.00–0.07)
Basophils Absolute: 0 10*3/uL (ref 0.0–0.1)
Basophils Relative: 0 %
Eosinophils Absolute: 0.1 10*3/uL (ref 0.0–0.5)
Eosinophils Relative: 1 %
HCT: 31.2 % — ABNORMAL LOW (ref 36.0–46.0)
Hemoglobin: 10 g/dL — ABNORMAL LOW (ref 12.0–15.0)
Immature Granulocytes: 2 %
Lymphocytes Relative: 15 %
Lymphs Abs: 1.2 10*3/uL (ref 0.7–4.0)
MCH: 27.9 pg (ref 26.0–34.0)
MCHC: 32.1 g/dL (ref 30.0–36.0)
MCV: 87.2 fL (ref 80.0–100.0)
Monocytes Absolute: 1.2 10*3/uL — ABNORMAL HIGH (ref 0.1–1.0)
Monocytes Relative: 15 %
Neutro Abs: 5.6 10*3/uL (ref 1.7–7.7)
Neutrophils Relative %: 67 %
Platelets: 308 10*3/uL (ref 150–400)
RBC: 3.58 MIL/uL — ABNORMAL LOW (ref 3.87–5.11)
RDW: 13.2 % (ref 11.5–15.5)
WBC: 8.2 10*3/uL (ref 4.0–10.5)
nRBC: 0 % (ref 0.0–0.2)

## 2022-01-24 LAB — GLUCOSE, CAPILLARY
Glucose-Capillary: 130 mg/dL — ABNORMAL HIGH (ref 70–99)
Glucose-Capillary: 149 mg/dL — ABNORMAL HIGH (ref 70–99)
Glucose-Capillary: 116 mg/dL — ABNORMAL HIGH (ref 70–99)
Glucose-Capillary: 156 mg/dL — ABNORMAL HIGH (ref 70–99)

## 2022-01-24 LAB — PHOSPHORUS: Phosphorus: 2.6 mg/dL (ref 2.5–4.6)

## 2022-01-24 MED ORDER — ASPIRIN 81 MG PO TBEC
81.0000 mg | DELAYED_RELEASE_TABLET | Freq: Every day | ORAL | Status: DC
  Administered 2022-01-24 – 2022-01-28 (×5): 81 mg via ORAL
  Filled 2022-01-24 (×5): qty 1

## 2022-01-24 NOTE — Discharge Summary (Signed)
Physician Discharge Summary  Patient ID: MECHILLE VARGHESE MRN: [id number] DOB/AGE: 1950-10-01 71 y.o.  Admit date: 01/23/2022 Discharge date: 01/28/2022  Discharge Diagnoses:  Principal Problem:   Vestibular schwannoma [facility]) Active problems: Functional deficits secondary to resection of vestibular schwannoma CKD stage 4 DM Glaucoma Hypertension Hypothyroidism Tachycardia CN 7 palsy  Discharged Condition: good  Significant Diagnostic Studies:  Study Result  Narrative & Impression  CLINICAL DATA:  Cough in adult patient   EXAM: CHEST - 2 VIEW   COMPARISON:  None Available.   FINDINGS: Normal mediastinum and cardiac silhouette. Normal pulmonary vasculature. No evidence of effusion, infiltrate, or pneumothorax. No acute bony abnormality.   IMPRESSION: No acute cardiopulmonary process.     Electronically Signed   By: Suzy Bouchard M.D.   On: 01/24/2022 10:03   DG Chest 2 View  Result Date: 01/24/2022 CLINICAL DATA:  Cough in adult patient EXAM: CHEST - 2 VIEW COMPARISON:  None Available. FINDINGS: Normal mediastinum and cardiac silhouette. Normal pulmonary vasculature. No evidence of effusion, infiltrate, or pneumothorax. No acute bony abnormality. IMPRESSION: No acute cardiopulmonary process. Electronically Signed   By: Suzy Bouchard M.D.   On: 01/24/2022 10:03   MR BRAIN W WO CONTRAST  Result Date: 01/22/2022 CLINICAL DATA:  Follow-up debulking of vestibular schwannoma EXAM: MRI HEAD WITHOUT AND WITH CONTRAST TECHNIQUE: Multiplanar, multiecho pulse sequences of the brain and surrounding structures were obtained without and with intravenous contrast. CONTRAST:  69m GADAVIST GADOBUTROL 1 MMOL/ML IV SOLN COMPARISON:  12/24/2021 FINDINGS: Brain: Multifocal diffusion abnormality primarily along the falx cerebri, likely pneumocephalus. Small amount of acute ischemia along the left posterior resection site. Blood products at the resection site. Tumor at the left  cerebellopontine angle has markedly decreased in size, 2.8 x 2.1 x 2.9 cm, previously 3.5 x 2.8 x 3.5 cm. There is a small amount of residual contrast enhancement at the porus acusticus. Edema surrounding the resection site. There is multifocal periventricular white matter hyperintensity, most often a result of chronic microvascular ischemia. Mild generalized volume loss. Mass effect on the brainstem is improved. There is improved patency of the cerebral aqueduct. Vascular: Major flow voids are preserved. Skull and upper cervical spine: Left retromastoid craniectomy. Sinuses/Orbits:Left mastoid effusion. Paranasal sinuses are clear. Normal orbits. IMPRESSION: 1. Marked decrease in size of left cerebellopontine angle mass, now 2.8 x 2.1 x 2.9 cm, previously 3.5 x 2.8 x 3.5 cm. 2. Improved mass effect on the brainstem and cerebral aqueduct. 3. Small amount of acute ischemia along the left posterior resection site. Electronically Signed   By: KUlyses JarredM.D.   On: 01/22/2022 00:52    Labs:  Basic Metabolic Panel: Recent Labs  Lab 01/22/22 0705 01/24/22 0627 01/27/22 0835  NA 138 137 135  K 4.2 4.0 4.0  CL 102 102 98  CO2 21* 24 24  GLUCOSE 166* 134* 160*  BUN 30* 36* 36*  CREATININE 1.82* 2.20* 2.23*  CALCIUM 8.5* 8.5* 8.4*  MG  --  1.8  --   PHOS 3.0 2.6  --     CBC: Recent Labs  Lab 01/24/22 0627 01/28/22 0550  WBC 8.2 4.8  NEUTROABS 5.6  --   HGB 10.0* 9.2*  HCT 31.2* 29.1*  MCV 87.2 87.7  PLT 308 293    CBG: Recent Labs  Lab 01/27/22 0606 01/27/22 1158 01/27/22 1650 01/27/22 2047 01/28/22 0538  GLUCAP 119* 137* 116* 124* 104*    Brief HPI:   Michelle DEPAULOis a 71y.o. female .  Michelle Rubio is a 71 year old female who presented with symptomatic left cystic vestibular schwannoma.  She underwent left craniectomy with resection of her tumor on 01/21/2022 by Dr. Venetia Constable.  Her preoperative symptoms included left hearing loss and ataxia.  On postop day 0, her  examination revealed left facial droop with complete eye closure. 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   Hospital Course: ANAIYA WISINSKI was admitted to rehab 01/23/2022 for inpatient therapies to consist of PT, ST and OT at least three hours five days a week. Past admission physiatrist, therapy team and rehab RN have worked together to provide customized collaborative inpatient rehab.  Initial had fever spike to 101.1 at 5 pm day of admission. She reported mild cough.  Chest xray and WBC normal. UA positive for small hemoglobin and moderate leukocytes. Ordered. She remained on her maintenance Bactrim on Mondays, Wednesdays and Fridays. Serum creatinine increased to 2.20 on 10/26 and patient given IVFs for two days. Creatinine 2.23 on 10/29 and WBC remained normal on 10/30.   Blood pressures were monitored on TID basis and remained stable. Lopressor increased to 25 mg daily due to tachycardia.  Diabetes has been monitored with ac/hs CBG checks and SSI was use prn for tighter BS control.   Rehab course: During patient's stay in rehab weekly team conferences were held to monitor patient's progress, set goals and discuss barriers to discharge. At admission, patient required supervision with basic self-care skills and min assist with mobility.  She has had improvement in activity tolerance, balance, postural control as well as ability to compensate for deficits. She has had improvement in functional use RUE/LUE  and RLE/LLE as well as improvement in awareness.  Disposition: Home Discharge disposition: 01-Home or Self Care      Diet: Carb modified  Special Instructions:  No driving, alcohol consumption or tobacco use.   30-35 minutes were spent on discharge planning and discharge summary.   Discharge Instructions     Discharge patient   Complete by: As directed    Discharge disposition: 01-Home or Self Care   Discharge patient date: 01/28/2022      Allergies as  of 01/28/2022   No Known Allergies      Medication List     TAKE these medications    acetaminophen 325 MG tablet Commonly known as: TYLENOL Take 1-2 tablets (325-650 mg total) by mouth every 4 (four) hours as needed for mild pain.   artificial tears Oint ophthalmic ointment Commonly known as: LACRILUBE Place into the left eye at bedtime.   aspirin 81 MG tablet Take 81 mg by mouth daily.   brimonidine 0.2 % ophthalmic solution Commonly known as: ALPHAGAN Place 1 drop into both eyes 2 (two) times daily.   diclofenac Sodium 1 % Gel Commonly known as: VOLTAREN Apply 2 g topically daily as needed (knee pain).   ezetimibe 10 MG tablet Commonly known as: ZETIA Take 10 mg by mouth daily.   famotidine 10 MG tablet Commonly known as: PEPCID Take 1 tablet (10 mg total) by mouth daily as needed for heartburn or indigestion.   Fish Oil 1200 MG Caps Take 2,400 mg by mouth daily.   levothyroxine 75 MCG tablet Commonly known as: SYNTHROID Take 75 mcg by mouth daily.   metoprolol tartrate 25 MG tablet Commonly known as: LOPRESSOR Take 12.5 mg by mouth 2 (two) times daily.   mycophenolate 180 MG EC tablet Commonly known as: MYFORTIC Take 360 mg by mouth 2 (two)  times daily.   Nexletol 180 MG Tabs Generic drug: Bempedoic Acid Take 180 mg by mouth daily.   polyvinyl alcohol 1.4 % ophthalmic solution Commonly known as: LIQUIFILM TEARS Place 1 drop into the left eye every 4 (four) hours while awake.   Rocklatan 0.02-0.005 % Soln Generic drug: Netarsudil-Latanoprost Place 1 drop into both eyes at bedtime.   sitaGLIPtin 25 MG tablet Commonly known as: JANUVIA Take 25 mg by mouth daily.   sulfamethoxazole-trimethoprim 400-80 MG tablet Commonly known as: BACTRIM Take 1 tablet by mouth every Monday, Wednesday, and Friday.   tacrolimus 1 MG capsule Commonly known as: PROGRAF Take 2 mg by mouth 2 (two) times daily.        Follow-up Information     Lucianne Lei, MD  Follow up.   Specialty: Family Medicine Why: Call in 1-2 days to make arrangments for hospital follow-up appointment Contact information: [address] [place] [address] [phone]         Charlett Blake, MD Follow up.   Specialty: Physical Medicine and Rehabilitation Why: As needed Contact information: [place] [address] [phone]         Judith Part, MD Follow up.   Specialty: Neurosurgery Why: Call in 1-2 days to make arrangments for hospital follow-up appointment Contact information: [place] [address] [phone]         Linde Gillis, MD Follow up.   Specialty: Nephrology Why: Call in 1-2 days to make arrangments for hospital follow-up appointment Contact information: [place] [address] [phone]                 Signed: Barbie Banner 01/28/2022, 11:06 AM

## 2022-01-24 NOTE — Progress Notes (Signed)
OOB to chair. Says her cough is better and denies chills. Staff has missed her last two voids in order to obtain UA. Her scalp incision is healing nicely without swelling or drainage.  She inquired about hair care. Allow hair oil now and avoid left scalp along incision. Will likely be able to wash her hair in the next 2-3 days.

## 2022-01-24 NOTE — Progress Notes (Signed)
Occupational Therapy Session Note  Patient Details  Name: Michelle Rubio MRN: [id number] Date of Birth: 29-Mar-1951  Today's Date: 01/24/2022 OT Individual Time: 0388-8280 OT Individual Time Calculation (min): 42 min    Short Term Goals: Week 1:  OT Short Term Goal 1 (Week 1): STGs = LTGs  Skilled Therapeutic Interventions/Progress Updates:  Pt greeted seated in w/c, pt agreeable to OT intervention. Pt completed ambulatory toilet tranfser with no AD and CGA. Pt completed 3/3 toileting tasks with CGA. Pt exited bathroom in same manner with pt able to stand at sink for hand hygiene with CGA. Pt completed functional ambulation to ADL apt with rollator and CGA.   Worked on dynamic balance in functional context with pt reaching out of BOS into [place] cabinets and below knee level to retrieve IADL items with no LOB and unilateral UB support. Pt also completed meal prep task of making oatmeal, pt did need assist to read small print on directions d/t visual deficits but reports at home she plans to use a magnifying glass to read her measuring cups. Would recommend supervision at this time for meal prep. Pt demonstrates good safety awareness during IADL task using her rollator appropriately, education provided on using rollator to transport items as needed or sit on rollator for energy conservation or when trying to reach items that are too low. Pt completed meal prep task with overall supervision for balance and supervision for safety awareness.   Pt ambulated back to room with rollator and CGA. Pt left seated in recliner with alarm belt activated and all needs within reach.   Therapy Documentation Precautions:  Precautions Precautions: Fall Restrictions Weight Bearing Restrictions: No  Pain: No pain    Therapy/Group: Individual Therapy  Corinne Ports [facility] 01/24/2022, 4:06 PM

## 2022-01-24 NOTE — Patient Care Conference (Signed)
Inpatient RehabilitationTeam Conference and Plan of Care Update Date: 01/25/2022   Time: 13:13 PM    Patient Name: Michelle Rubio      Medical Record Number: [id number]  Date of Birth: [birth year] Sex: Female         Room/Bed: 4W20C/4W20C-01 Payor Info: Payor: [employer]AGE / Plan: [employer] Must PPO / Product Type: *No Product type* /    Admit Date/Time:  01/23/2022  4:17 PM  Primary Diagnosis:  Vestibular schwannoma [facility])  Hospital Problems: Principal Problem:   Vestibular schwannoma [facility])    Expected Discharge Date: Expected Discharge Date: 01/28/22  Team Members Present: Physician leading conference: Dr. Alysia Penna Social Worker Present: Erlene Quan, BSW Nurse Present: Dorien Chihuahua, RN PT Present: Barrie Folk, PT OT Present: Meriel Pica, OT     Current Status/Progress Goal Weekly Team Focus  Bowel/Bladder     Continent of bowel and bladder        Swallow/Nutrition/ Hydration             ADL's   close S with all self care tasks due to decreased dynamic standing balance  Mod I with all self care and transfers; supervision with light meal prep and light housekeeping  ADL training, balance training, pt/fam education   Mobility   Min assist without AD for gait and transfers  Mod I with LRAD for mobility except supervision assist for car trasnfers and stair managment to access home.  improved safety with gait and trasnfers. dynamic balance and coordination training.. family education   Communication             Safety/Cognition/ Behavioral Observations            Pain     N/a        Skin     Incision left head; skin glue   No skin issues   Assess skin q shift    Discharge Planning:    Her sister is coming from [place] to stay with her, sons are very supportive.  Team Discussion: Patient doing well post tumor resection. Patient on target to meet rehab goals: yes, currently supervision with ambulation in room and with self  care but needs more A with PT tasks due to LE incoordination and balance. Goals for discharge set for Mod I assist overall.  *See Care Plan and progress notes for long and short-term goals.   Revisions to Treatment Plan:  N/a  Teaching Needs: Safety, medications, skin care, transfers, etc  Current Barriers to Discharge: Decreased caregiver support and Home enviroment access/layout  Possible Resolutions to Barriers: Family education OP  PT follow up services No DME; patient has rollator and [facility]     Medical Summary Current Status: Left CN 7 and 8 palsy , post op fever  Barriers to Discharge: Medical stability   Possible Resolutions to [employer] Focus: work up for post op fever   Continued Need for Acute Rehabilitation Level of Care: The patient requires daily medical management by a physician with specialized training in physical medicine and rehabilitation for the following reasons: Direction of a multidisciplinary physical rehabilitation program to maximize functional independence : Yes Medical management of patient stability for increased activity during participation in an intensive rehabilitation regime.: Yes Analysis of laboratory values and/or radiology reports with any subsequent need for medication adjustment and/or medical intervention. : Yes   I attest that I was present, lead the team conference, and concur with the assessment and plan of the team.   Hervey Ard,  Dalbert Batman 01/25/2022, 5:20 PM

## 2022-01-24 NOTE — Evaluation (Signed)
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[place])   Past Medical History:  Past Medical History:  Diagnosis Date   Acute renal failure on dialysis ([facility])    Body mass index (BMI) 20.0-20.9, adult    Chronic kidney disease    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   Diabetes mellitus without complication ([place])    type 2   Glaucoma    Gout    H/O Graves' disease 1980s   Hyperlipidemia    Hypertension    Hypothyroidism    Mitral valve prolapse    Past Surgical History:  Past Surgical History:  Procedure Laterality Date   CRANIOTOMY Left 01/21/2022   Procedure: Left Craniotomy for Tumor Resection;  Surgeon: Judith Part, MD;  Location: [facility];  Service: Neurosurgery;  Laterality: Left;   KIDNEY TRANSPLANT  2012   THYROIDECTOMY, PARTIAL  1985   TONSILLECTOMY     TUBAL LIGATION      Assessment & Plan Clinical Impression: Patient is a 71 year old female who presented with symptomatic left cystic vestibular schwannoma.  She underwent left craniectomy with resection of her tumor on 01/21/2022 by Dr. Venetia Constable.  Her preoperative symptoms included left hearing loss and ataxia.  On postop day 0, her examination revealed left facial droop with complete eye closure. She was observed overnight in the ICU. House-Brackmann score 4 on POD 1.  Follow-up MRI of the brain revealed expected subtotal, 70% resection by volume with lower cranial nerve function intact.  Eyedrops continue and left lid taped closed at nighttime.  Of cutaneous heparin started for DVT prophylaxis.  Home aspirin 81 mg on hold. The patient  requires inpatient physical medicine and rehabilitation evaluations and treatment secondary to dysfunction due to left cystic vestibular schwannoma that is postresection.   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  Patient transferred to CIR on 01/23/2022 .   Patient currently requires min with mobility secondary to muscle weakness and muscle joint tightness, decreased cardiorespiratoy endurance, decreased coordination, and decreased standing balance and decreased balance strategies.  Prior to hospitalization, patient was modified independent  with mobility and lived with Alone in a House home.  Home access is 1Stairs to enter.  Patient will benefit from skilled PT intervention to maximize safe functional mobility and minimize fall risk for planned discharge home with intermittent assist.  Anticipate patient will benefit from follow up OP at discharge.  PT - End of Session Activity Tolerance: Tolerates 10 - 20 min activity with multiple rests Endurance Deficit: Yes PT Assessment Rehab Potential (ACUTE/IP ONLY): Excellent PT Barriers to Discharge: Insurance for SNF coverage;Medication compliance PT Patient demonstrates impairments in the following area(s): Balance;Endurance;Motor PT Transfers Functional Problem(s): Bed Mobility;Bed to Chair;Car;Furniture;Floor PT Locomotion Functional Problem(s): Ambulation;Stairs PT Plan PT Intensity: Minimum of 1-2 x/day ,45 to 90 minutes PT Frequency: 5 out of 7 days PT Duration Estimated Length of Stay: 3-5 days PT Treatment/Interventions: Ambulation/gait training;Discharge planning;Functional mobility training;Psychosocial support;Therapeutic Activities;Visual/perceptual remediation/compensation;Balance/vestibular training;Disease management/prevention;Neuromuscular re-education;Skin care/wound  management;Therapeutic Exercise;Wheelchair propulsion/positioning;Cognitive remediation/compensation;DME/adaptive equipment instruction;Pain management;UE/LE Strength taining/ROM;Community reintegration;Patient/family education;Stair training;UE/LE Coordination activities;Functional electrical stimulation  PT Transfers Anticipated Outcome(s): Mod I with LRAD PT Locomotion Anticipated Outcome(s): Mod I with LRAD ambulatory PT Recommendation Recommendations for Other Services: Therapeutic Recreation consult Therapeutic Recreation Interventions: Stress management Follow Up Recommendations: Outpatient PT Patient destination: Home Equipment Recommended: To be determined   PT Evaluation Precautions/Restrictions Precautions Precautions: Fall Restrictions Weight Bearing Restrictions: No General PT Amount of Missed Time (min): 30 Minutes PT Missed Treatment Reason: Xray Vital SignsTherapy Vitals Temp: 98.9 F (37.2 C) Temp Source: Oral Pulse Rate: 95 Resp: 16 BP: (!) 143/82 Patient Position (if appropriate): Sitting Oxygen Therapy SpO2: 100 % O2 Device: Room Air Pain Pain Assessment Pain Scale: 0-10 Pain Score: 0-No pain Pain Interference Pain Interference Pain Effect on Sleep: 2. Occasionally Pain Interference with Therapy Activities: 1. Rarely or not at all Pain Interference with Day-to-Day Activities: 1. Rarely or not at all Home Living/Prior [place] Living Arrangements: Alone Available Help at Discharge: Family;Available 24 hours/day Type of Home: House Home Access: Stairs to enter [employer] of Steps: 1 Home Layout: One level Bathroom Shower/Tub: Walk-in shower  Lives With: Alone Prior Function Level of Independence: Requires assistive device for independence (RW)  Able to Take Stairs?: Yes Driving: Yes Vocation: Retired Radiographer, therapeutic - History Ability to See in Adequate Light: 0 Adequate Vision - Assessment Eye Alignment:  Impaired (comment) Ocular Range of Motion: Within Functional Limits Alignment/Gaze Preference: Within Defined Limits Tracking/Visual Pursuits: Able to track stimulus in all quads without difficulty Saccades: Additional eye shifts occurred during testing Diplopia Assessment: Other (comment) Additional Comments: Pt has improved eye movement control compared to assessment on acute care 01/21/22.  She now has control of R eye lid and can volitionally close L eye 50%. If she manually closes L eye lid, she is able to maintain closure for 20 sec or less. Perception Perception: Within Functional Limits Praxis Praxis: Intact  Cognition Overall Cognitive Status: Within Functional Limits for tasks assessed Arousal/Alertness: Awake/alert Memory: Appears intact Awareness: Appears intact Problem Solving: Appears intact Safety/Judgment: Appears intact Sensation Sensation Light Touch: Appears Intact Hot/Cold: Appears Intact Proprioception: Appears Intact Stereognosis: Appears Intact Coordination Gross Motor Movements are Fluid and Coordinated: No Fine Motor Movements are Fluid and Coordinated: Yes Coordination and Movement Description: decreased LE coordination impacting higher level balance Finger Nose Finger Test: mild undershooting bil Heel Shin Test: mild dysmetria on the LLE Motor  Motor Motor: Ataxia Motor - Skilled Clinical Observations: LE ataxia   Trunk/Postural Assessment  Cervical Assessment Cervical Assessment: Within Functional Limits Thoracic Assessment Thoracic Assessment: Within Functional Limits Lumbar Assessment Lumbar Assessment: Within Functional Limits Postural Control Postural Control: Deficits on evaluation Righting Reactions: limitations with reaction time for balance to her left  Balance Balance Balance Assessed: Yes Static Sitting Balance Static Sitting - Level of Assistance: 7: Independent Dynamic Sitting Balance Dynamic Sitting - Level of Assistance: 5:  Stand by assistance Static Standing Balance Static Standing - Level of Assistance: 4: Min assist;5: Stand by assistance Dynamic Standing Balance Dynamic Standing - Level of Assistance: 4: Min assist Extremity Assessment  RUE Assessment RUE Assessment: Within Functional Limits LUE Assessment LUE Assessment: Within Functional Limits      Care Tool Care Tool Bed Mobility Roll left and right activity   Roll left and right assist level: Supervision/Verbal cueing    Sit to lying activity   Sit to lying assist level: Supervision/Verbal cueing    Lying to sitting on side of bed activity   Lying to sitting on side of bed assist level: the  ability to move from lying on the back to sitting on the side of the bed with no back support.: Supervision/Verbal cueing     Care Tool Transfers Sit to stand transfer   Sit to stand assist level: Supervision/Verbal cueing    Chair/bed transfer   Chair/bed transfer assist level: Minimal Assistance - Patient > 75%     Toilet transfer   Assist Level: Supervision/Verbal cueing    Car transfer   Car transfer assist level: Minimal Assistance - Patient > 75%      Care Tool Locomotion Ambulation   Assist level: Minimal Assistance - Patient > 75% Assistive device: Hand held assist Max distance: 150  Walk 10 feet activity   Assist level: Minimal Assistance - Patient > 75% Assistive device: Hand held assist   Walk 50 feet with 2 turns activity   Assist level: Minimal Assistance - Patient > 75% Assistive device: Hand held assist  Walk 150 feet activity   Assist level: Minimal Assistance - Patient > 75% Assistive device: Hand held assist  Walk 10 feet on uneven surfaces activity   Assist level: Minimal Assistance - Patient > 75% Assistive device: Hand held assist  Stairs   Assist level: Minimal Assistance - Patient > 75% Stairs assistive device: 2 hand rails Max number of stairs: 12  Walk up/down 1 step activity   Walk up/down 1 step (curb)  assist level: Minimal Assistance - Patient > 75% Walk up/down 1 step or curb assistive device: 2 hand rails  Walk up/down 4 steps activity   Walk up/down 4 steps assist level: Minimal Assistance - Patient > 75% Walk up/down 4 steps assistive device: 2 hand rails  Walk up/down 12 steps activity     Walk up/down 12 steps assistive device: 2 hand rails  Pick up small objects from floor   Pick up small object from the floor assist level: Minimal Assistance - Patient > 75%    Wheelchair Is the patient using a wheelchair?: No   Wheelchair activity did not occur: N/A      Wheel 50 feet with 2 turns activity Wheelchair 50 feet with 2 turns activity did not occur: N/A    Wheel 150 feet activity Wheelchair 150 feet activity did not occur: N/A      Refer to Care Plan for Long Term Goals  SHORT TERM GOAL WEEK 1 PT Short Term Goal 1 (Week 1): STG=LTG due to ELOS  Recommendations for other services: Therapeutic Recreation  Stress management  Skilled Therapeutic Intervention Mobility Bed Mobility Bed Mobility: Rolling Right;Rolling Left;Sit to Supine;Supine to Sit Rolling Right: Supervision/verbal cueing Rolling Left: Supervision/Verbal cueing Supine to Sit: Supervision/Verbal cueing Sit to Supine: Supervision/Verbal cueing Transfers Transfers: Sit to Stand;Stand Pivot Transfers Sit to Stand: Minimal Assistance - Patient > 75% Stand Pivot Transfers: Minimal Assistance - Patient > 75% Transfer (Assistive device): 1 person hand held assist Locomotion  Gait Ambulation: Yes Gait Assistance: Minimal Assistance - Patient > 75% Gait Distance (Feet): 150 Feet Assistive device: 1 person hand held assist Gait Assistance Details: Verbal cues for precautions/safety;Verbal cues for gait pattern Gait Assistance Details: LUE support Gait Gait: Yes Gait Pattern: Impaired Gait Pattern: Narrow base of support Stairs / Additional Locomotion Stairs: Yes Stairs Assistance: Minimal Assistance -  Patient > 75% Stair Management Technique: Two rails Number of Stairs: 12 Height of Stairs: 6 Wheelchair Mobility Wheelchair Mobility: No  Attempted to see pt for evaluation. Transport present to take pt to xray. PT returned in 30 minutes and  pt just returned to room. Pt received supine in bed and agreeable to PT. Supine>sit transfer with supervision assist and min cues for safety and decreased use of bed features. PT instructed patient in PT Evaluation and initiated treatment intervention; see above for results. PT educated patient in [place], rehab potential, rehab goals, and discharge recommendations along with recommendation for follow-up rehabilitation services. Pt performed step management up/down curb step with RW and cues for AD management and posture. Car transfer training with no AD and min assist for safety. Gait training to ascend and descend ramp with no UE support and min assist for safety on descent. Stair management training with BUE support on rails as listed above with step through gait pattern and min cues for step length on the LLE to reduce fall risk. Patient returned to room and left sitting in [facility] with call bell in reach and all needs met.       Discharge Criteria: Patient will be discharged from PT if patient refuses treatment 3 consecutive times without medical reason, if treatment goals not met, if there is a change in medical status, if patient makes no progress towards goals or if patient is discharged from hospital.  The above assessment, treatment plan, treatment alternatives and goals were discussed and mutually agreed upon: by patient and by family  Lorie Phenix 01/24/2022, 1:23 PM

## 2022-01-24 NOTE — Progress Notes (Signed)
Inpatient [facility] Individual Statement of Services  Patient Name:  Michelle Rubio  Date:  01/24/2022  Welcome to the [facility].  Our goal is to provide you with an individualized program based on your diagnosis and situation, designed to meet your specific needs.  With this comprehensive rehabilitation program, you will be expected to participate in at least 3 hours of rehabilitation therapies Monday-Friday, with modified therapy programming on the weekends.  Your rehabilitation program will include the following services:  Physical Therapy (PT), Occupational Therapy (OT), Speech Therapy (ST), 24 hour per day rehabilitation nursing, Therapeutic Recreaction (TR), Neuropsychology, Care Coordinator, Rehabilitation Medicine, Nutrition Services, Pharmacy Services, and Other  Weekly team conferences will be held on Wednesdays to discuss your progress.  Your Inpatient Rehabilitation Care Coordinator will talk with you frequently to get your input and to update you on team discussions.  Team conferences with you and your family in attendance may also be held.  Expected length of stay:  7-10 Days  Overall anticipated outcome:  Supervision  Depending on your progress and recovery, your program may change. Your Inpatient Rehabilitation Care Coordinator will coordinate services and will keep you informed of any changes. Your Inpatient Rehabilitation Care Coordinator's name and contact numbers are listed  below.  The following services may also be recommended but are not provided by the [facility]:   [place] will be made to provide these services after discharge if needed.  Arrangements include referral to agencies that provide these services.  Your insurance has been verified to be:   [employer] Your primary doctor is:  Lucianne Lei, MD  Pertinent  information will be shared with your doctor and your insurance company.  Inpatient Rehabilitation Care Coordinator:  Erlene Quan, [place] or [phone]  Information discussed with and copy given to patient by: Dyanne Iha, 01/24/2022, 11:50 AM

## 2022-01-24 NOTE — Discharge Instructions (Addendum)
Inpatient Rehab Discharge Instructions  Michelle Rubio Discharge date and time:  01/28/2022  Activities/Precautions/ Functional Status: Activity: no lifting, driving, or strenuous exercise until cleared by MD Diet: diabetic diet Wound Care: keep wound clean and dry Functional status:  ___ No restrictions     ___ Walk up steps independently ___ 24/7 supervision/assistance   ___ Walk up steps with assistance _x__ Intermittent supervision/assistance  ___ Bathe/dress independently ___ Walk with walker     ___ Bathe/dress with assistance ___ Walk Independently    ___ Shower independently ___ Walk with assistance    __x_ Shower with assistance __x_ No alcohol     ___ Return to work/school ________   Special Instructions:  No driving, alcohol consumption or tobacco use.    COMMUNITY REFERRALS UPON DISCHARGE:    Outpatient: PT                 Agency:[facility] DJMEQ:[phone]               Appointment Date/Time: TBD after discharge.   My questions have been answered and I understand these instructions. I will adhere to these goals and the provided educational materials after my discharge from the hospital.  Patient/Caregiver Signature _______________________________ Date __________  Clinician Signature _______________________________________ Date __________  Please bring this form and your medication list with you to all your follow-up doctor's appointments.

## 2022-01-24 NOTE — Progress Notes (Signed)
Inpatient Rehabilitation Care Coordinator Assessment and Plan Patient Details  Name: Michelle Rubio MRN: [id number] Date of Birth: Dec 27, 1950  Today's Date: 01/24/2022  Hospital Problems: Principal Problem:   Vestibular schwannoma [facility])  Past Medical History:  Past Medical History:  Diagnosis Date   Acute renal failure on dialysis ([place])    Body mass index (BMI) 20.0-20.9, adult    Chronic kidney disease    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   Diabetes mellitus without complication ([place])    type 2   Glaucoma    Gout    H/O Graves' disease 1980s   Hyperlipidemia    Hypertension    Hypothyroidism    Mitral valve prolapse    Past Surgical History:  Past Surgical History:  Procedure Laterality Date   CRANIOTOMY Left 01/21/2022   Procedure: Left Craniotomy for Tumor Resection;  Surgeon: Judith Part, MD;  Location: [facility];  Service: Neurosurgery;  Laterality: Left;   KIDNEY TRANSPLANT  2012   THYROIDECTOMY, PARTIAL  1985   TONSILLECTOMY     TUBAL LIGATION     Social History:  reports that she has never smoked. She does not have any smokeless tobacco history on file. She reports that she does not drink alcohol and does not use drugs.  Family / Support Systems Children: Remo Lipps and Alvester Chou (Sons) Anticipated Caregiver: sons and family Ability/Limitations of Caregiver: Family anticipates rorating supervision Caregiver Availability: 24/7 Family Dynamics: supprt form children  Social History Preferred language: English Religion: [employer] - How often do you need to have someone help you when you read instructions, pamphlets, or other written material from your doctor or pharmacy?: Never Writes: Yes   Abuse/Neglect Abuse/Neglect Assessment Can Be Completed: Yes Physical Abuse: Denies Verbal Abuse: Denies Sexual Abuse: Denies Exploitation of patient/patient's resources: Denies Self-Neglect: Denies  Patient response to: Social  Isolation - How often do you feel lonely or isolated from those around you?: Never  Emotional Status Recent Psychosocial Issues: coping Psychiatric History: n/a Substance Abuse History: n/a  Patient / Family Perceptions, Expectations & Goals Pt/Family understanding of illness & functional limitations: yes Premorbid pt/family roles/activities: Independent, MOD I and driving Anticipated changes in roles/activities/participation: Family plans to rotate supervision and assist as needed at discharge Pt/family expectations/goals: Supervision  Community Resources Premorbid Home Care/DME Agencies: Other (Comment) Librarian, academic and [facility]) Transportation available at discharge: Family able to transport Is the patient able to respond to transportation needs?: Yes In the past 12 months, has lack of transportation kept you from medical appointments or from getting medications?: No In the past 12 months, has lack of transportation kept you from meetings, work, or from getting things needed for daily living?: No  Discharge Planning Living Arrangements: Alone Support Systems: Children Type of Residence: Private residence Insurance Resources: Multimedia programmer (specify) (HTA) Financial Screen Referred: No Money Management: Patient Does the patient have any problems obtaining your medications?: No Home Management: Independent Patient/Family Preliminary Plans: Family will assist if needed Care Coordinator Barriers to Discharge: Insurance for SNF coverage, Lack of/limited family support, Decreased caregiver support Care Coordinator Anticipated Follow Up Needs: HH/OP Expected length of stay: 7-10 Days  Clinical Impression Sw met with patient, introduced self and explained role. Patient anticipates discharge back to her home with sister coming to sister from [place]. SW spoke with patient and sister and both are on board with patient discharging on Monday. Patient sons will pick up patient and sister  will be present to assist  with care and 24/7.  Dyanne Iha 01/24/2022, 1:26 PM

## 2022-01-24 NOTE — Evaluation (Addendum)
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[place])   Past Medical History:  Past Medical History:  Diagnosis Date   Acute renal failure on dialysis ([place])    Body mass index (BMI) 20.0-20.9, adult    Chronic kidney disease    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   Diabetes mellitus without complication ([place])    type 2   Glaucoma    Gout    H/O Graves' disease 1980s   Hyperlipidemia    Hypertension    Hypothyroidism    Mitral valve prolapse    Past Surgical History:  Past Surgical History:  Procedure Laterality Date   CRANIOTOMY Left 01/21/2022   Procedure: Left Craniotomy for Tumor Resection;  Surgeon: Judith Part, MD;  Location: [facility];  Service: Neurosurgery;  Laterality: Left;   KIDNEY TRANSPLANT  2012   THYROIDECTOMY, PARTIAL  1985   TONSILLECTOMY     TUBAL LIGATION      Assessment & Plan Clinical Impression: Michelle Rubio is a 71 year old female who presented with symptomatic left cystic vestibular schwannoma.  She underwent left craniectomy with resection of her tumor on 01/21/2022 by Dr. Venetia Constable.  Her preoperative symptoms included left hearing loss and ataxia.  On postop day 0, her examination revealed left facial droop with complete eye closure. She was observed overnight in the ICU. House-Brackmann score 4 on POD 1.  Follow-up MRI of the brain revealed expected subtotal, 70% resection by volume with lower cranial nerve function intact.  Eyedrops continue and left lid taped closed at nighttime.  Of cutaneous heparin started for DVT prophylaxis.  Home aspirin 81 mg on hold. The patient requires inpatient  physical medicine and rehabilitation evaluations and treatment secondary to dysfunction due to left cystic vestibular schwannoma that is postresection.   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 Her nephrologist is Dr. Kris Mouton at [facility]. Patient transferred to [facility] on 01/23/2022 .    Patient currently requires supervision with basic self-care skills secondary to decreased standing balance, decreased postural control, and decreased balance strategies.  Prior to hospitalization, patient could complete ADLs with min due to ongoing problems with current diagnosis.  Prior to that, she was fully independent and driving.   Patient will benefit from skilled intervention to increase independence with basic self-care skills and increase level of independence with iADL prior to discharge home with care partner.  Anticipate patient will require intermittent supervision and follow up home health.  OT - End of Session Activity Tolerance: Tolerates 30+ min activity with multiple rests OT Assessment Rehab Potential (ACUTE ONLY): Excellent OT Patient demonstrates impairments in the following area(s): Balance OT Basic ADL's Functional Problem(s): Toileting;Dressing;Bathing OT Advanced ADL's Functional Problem(s): Simple Meal Preparation;Light Housekeeping OT Transfers Functional Problem(s): Toilet;Tub/Shower OT Additional Impairment(s): None OT Plan OT Intensity: Minimum of 1-2 x/day, 45 to 90 minutes OT Frequency: 5 out of 7 days OT Duration/Estimated Length of Stay: 3- 4 days OT Treatment/Interventions: Teacher, English as a foreign language;Discharge planning;Neuromuscular re-education;Functional mobility training;Patient/family education;Psychosocial support;Self Care/advanced ADL retraining;Therapeutic Activities;Therapeutic  Exercise;UE/LE Strength taining/ROM;UE/LE Coordination activities OT Self Feeding Anticipated Outcome(s):  no goal, pt is independent OT Basic Self-Care Anticipated Outcome(s): Mod I OT Toileting Anticipated Outcome(s): Mod I OT Bathroom Transfers Anticipated Outcome(s): Mod I OT Recommendation Patient destination: Home Follow Up Recommendations: Home health OT Equipment Recommended: None recommended by OT   OT Evaluation Precautions/Restrictions  Precautions Precautions: Fall Restrictions Weight Bearing Restrictions: No  Pain Pain Assessment Pain Scale: 0-10 Pain Score: 0-No pain Home Living/Prior Functioning Home Living Family/patient expects to be discharged to:: Private residence Living Arrangements: Alone Available Help at Discharge: Family, Available 24 hours/day Type of Home: House Home Access: Stairs to enter Technical brewer of Steps: 1 Home Layout: One level Bathroom Shower/Tub: Walk-in shower  Lives With: Alone Prior Function Level of Independence: Requires assistive device for independence (RW)  Able to Take Stairs?: Yes Driving: Yes Vocation: Retired Surveyor, mining Baseline Vision/History: 1 Wears glasses (pt is not wearing now as she feel her current vision is worse with glasses) Ability to See in Adequate Light: 0 Adequate Patient Visual Report: No change from baseline Vision Assessment?: Yes Eye Alignment: Impaired (comment) (L eye sits higher than R eye) Ocular Range of Motion: Within Functional Limits Alignment/Gaze Preference: Within Defined Limits Tracking/Visual Pursuits: Able to track stimulus in all quads without difficulty Saccades: Within functional limits Visual Fields: No apparent deficits Diplopia Assessment: Other (comment) (pt states she is not having any double vision) Depth Perception:  [facility]) Additional Comments: Pt has improved eye movement control compared to assessment on acute care 01/21/22.  She now has control of R eye lid and can  volitionally close L eye 50%. If she manually closes L eye lid, she is able to maintain closure for 20 sec or less. Perception  Perception: Within Functional Limits Praxis Praxis: Intact Cognition Cognition Overall Cognitive Status: Within Functional Limits for tasks assessed Arousal/Alertness: Awake/alert Orientation Level: Person;Place;Situation Person: Oriented Place: Oriented Situation: Oriented Memory: Appears intact Awareness: Appears intact Problem Solving: Appears intact Safety/Judgment: Appears intact Brief Interview for Mental Status (BIMS) Repetition of Three Words (First Attempt): 3 Temporal Orientation: Year: Correct Temporal Orientation: Month: Accurate within 5 days Temporal Orientation: Day: Correct Recall: "Sock": Yes, no cue required Recall: "Blue": Yes, no cue required Recall: "Bed": Yes, no cue required BIMS Summary Score: 15 Sensation Sensation Light Touch: Appears Intact Hot/Cold: Appears Intact Proprioception: Appears Intact Stereognosis: Appears Intact Coordination Gross Motor Movements are Fluid and Coordinated: No Fine Motor Movements are Fluid and Coordinated: Yes Coordination and Movement Description: decreased LE coordination impacting higher level balance Finger Nose Finger Test: WFL Motor  Motor Motor: Ataxia Motor - Skilled Clinical Observations: LE ataxia  Trunk/Postural Assessment  Cervical Assessment Cervical Assessment: Within Functional Limits Thoracic Assessment Thoracic Assessment: Within Functional Limits Lumbar Assessment Lumbar Assessment: Within Functional Limits Postural Control Postural Control: Deficits on evaluation Righting Reactions: limitations with reaction time for balance to her left  Balance Static Sitting Balance Static Sitting - Level of Assistance: 7: Independent Dynamic Sitting Balance Dynamic Sitting - Level of Assistance: 5: Stand by assistance Static Standing Balance Static Standing - Level of  Assistance: 5: Stand by assistance Dynamic Standing Balance Dynamic Standing - Level of Assistance: 4: Min assist (during higher level balance tasks, is close S during basic self care) Extremity/Trunk Assessment RUE Assessment RUE Assessment: Within Functional Limits LUE Assessment LUE Assessment: Within Functional Limits  Care Tool Care Tool Self Care Eating   Eating Assist Level: Independent    Oral Care    Oral Care Assist Level: Independent    Bathing   Body parts bathed  by patient: Right arm;Left arm;Chest;Abdomen;Front perineal area;Right upper leg;Left upper leg;Buttocks;Face;Left lower leg;Right lower leg     Assist Level: Supervision/Verbal cueing    Upper Body Dressing(including orthotics)   What is the patient wearing?: Bra;Pull over shirt   Assist Level: Independent    Lower Body Dressing (excluding footwear)   What is the patient wearing?: Underwear/pull up;Pants Assist for lower body dressing: Supervision/Verbal cueing    Putting on/Taking off footwear   What is the patient wearing?: Socks;Shoes Assist for footwear: Supervision/Verbal cueing       Care Tool Toileting Toileting activity   Assist for toileting: Supervision/Verbal cueing     Care Tool Bed Mobility Roll left and right activity        Sit to lying activity        Lying to sitting on side of bed activity         Care Tool Transfers Sit to stand transfer   Sit to stand assist level: Supervision/Verbal cueing    Chair/bed transfer   Chair/bed transfer assist level: Supervision/Verbal cueing     Toilet transfer   Assist Level: Supervision/Verbal cueing     Care Tool Cognition  Expression of Ideas and Wants Expression of Ideas and Wants: 4. Without difficulty (complex and basic) - expresses complex messages without difficulty and with speech that is clear and easy to understand  Understanding Verbal and Non-Verbal Content Understanding Verbal and Non-Verbal Content: 4.  Understands (complex and basic) - clear comprehension without cues or repetitions   Memory/Recall Ability Memory/Recall Ability : Current season;Location of own room;Staff names and faces;That he or she is in a hospital/hospital unit   Refer to Care Plan for [place] 1 OT Short Term Goal 1 (Week 1): STGs = LTGs  Recommendations for other services: None    Skilled Therapeutic Intervention ADL ADL Eating: Independent Grooming: Independent Where Assessed-Grooming: Sitting at sink Upper Body Bathing: Supervision/safety Where Assessed-Upper Body Bathing: Shower Lower Body Bathing: Supervision/safety Where Assessed-Lower Body Bathing: Shower Upper Body Dressing: Independent Where Assessed-Upper Body Dressing: Chair Lower Body Dressing: Supervision/safety Where Assessed-Lower Body Dressing: Chair Toileting: Supervision/safety Where Assessed-Toileting: Glass blower/designer: Close supervision Toilet Transfer Method: Magazine features editor: Close supervision Social research officer, government Method: Heritage manager: Transfer tub bench;Grab bars   Pt seen for initial evaluation and ADL training with a focus on dynamic balance.   Her 2 sons were present at the start of the session and educated them on rehab POC and discussed with pt her goals.  Pt very eager to shower. Covered her IV site with plastic wrap.  Pt ambulated without AD with close S from wc to toilet to shower bench.  Pt needed close S with all self care tasks.  Recommended she sit for most of the shower and sit to don clothing over feet as she has decreased balance.  Pt very receptive to cues. She is most challenged when she stands up too quickly so practiced having her feeling "grounded" taking a deep breath and focusing rising to stand slowly. This enabled pt to move more smoothly and decrease chance of LOB to her L.  Discussed having her work on higher level tasks. She stated  her sister is coming to stay with her for as long as needed and her sons can also help.  Pt worked on various visual and UE coordination assessments without difficulty.  She practiced manually closing L eye fully (to avoid dry eye) and  practiced holding it closed for 10 seconds at a time.   Pt is very motivated and eager to get independent again.  Pt resting in wc with belt alarm on and all needs met.    Discharge Criteria: Patient will be discharged from OT if patient refuses treatment 3 consecutive times without medical reason, if treatment goals not met, if there is a change in medical status, if patient makes no progress towards goals or if patient is discharged from hospital.  The above assessment, treatment plan, treatment alternatives and goals were discussed and mutually agreed upon: by patient and by family  SAGUIER,JULIA 01/24/2022, 12:38 PM

## 2022-01-24 NOTE — Progress Notes (Signed)
Inpatient Rehabilitation  Patient information reviewed and entered into eRehab system by Anthonette Lesage Brynnan Rodenbaugh, OTR/L, Rehab Quality Coordinator.   Information including medical coding, functional ability and quality indicators will be reviewed and updated through discharge.   

## 2022-01-24 NOTE — Plan of Care (Signed)
  Problem: RH Balance Goal: LTG Patient will maintain dynamic standing with ADLs (OT) Description: LTG:  Patient will maintain dynamic standing balance with assist during activities of daily living (OT)  Flowsheets (Taken 01/24/2022 1254) LTG: Pt will maintain dynamic standing balance during ADLs with: Independent with assistive device   Problem: Sit to Stand Goal: LTG:  Patient will perform sit to stand in prep for activites of daily living with assistance level (OT) Description: LTG:  Patient will perform sit to stand in prep for activites of daily living with assistance level (OT) Flowsheets (Taken 01/24/2022 1254) LTG: PT will perform sit to stand in prep for activites of daily living with assistance level: Independent with assistive device   Problem: RH Bathing Goal: LTG Patient will bathe all body parts with assist levels (OT) Description: LTG: Patient will bathe all body parts with assist levels (OT) Flowsheets (Taken 01/24/2022 1254) LTG: Pt will perform bathing with assistance level/cueing: Independent with assistive device  LTG: Position pt will perform bathing: Shower   Problem: RH Dressing Goal: LTG Patient will perform upper body dressing (OT) Description: LTG Patient will perform upper body dressing with assist, with/without cues (OT). Flowsheets (Taken 01/24/2022 1254) LTG: Pt will perform upper body dressing with assistance level of: Independent Goal: LTG Patient will perform lower body dressing w/assist (OT) Description: LTG: Patient will perform lower body dressing with assist, with/without cues in positioning using equipment (OT) Flowsheets (Taken 01/24/2022 1254) LTG: Pt will perform lower body dressing with assistance level of: Independent with assistive device   Problem: RH Toileting Goal: LTG Patient will perform toileting task (3/3 steps) with assistance level (OT) Description: LTG: Patient will perform toileting task (3/3 steps) with assistance level (OT)   Flowsheets (Taken 01/24/2022 1254) LTG: Pt will perform toileting task (3/3 steps) with assistance level: Independent with assistive device   Problem: RH Simple Meal Prep Goal: LTG Patient will perform simple meal prep w/assist (OT) Description: LTG: Patient will perform simple meal prep with assistance, with/without cues (OT). Flowsheets (Taken 01/24/2022 1254) LTG: Pt will perform simple meal prep with assistance level of: Supervision/Verbal cueing   Problem: RH Light Housekeeping Goal: LTG Patient will perform light housekeeping w/assist (OT) Description: LTG: Patient will perform light housekeeping with assistance, with/without cues (OT). Flowsheets (Taken 01/24/2022 1254) LTG: Pt will perform light housekeeping with assistance level of: Supervision/Verbal cueing   Problem: RH Toilet Transfers Goal: LTG Patient will perform toilet transfers w/assist (OT) Description: LTG: Patient will perform toilet transfers with assist, with/without cues using equipment (OT) Flowsheets (Taken 01/24/2022 1254) LTG: Pt will perform toilet transfers with assistance level of: Independent with assistive device   Problem: RH Tub/Shower Transfers Goal: LTG Patient will perform tub/shower transfers w/assist (OT) Description: LTG: Patient will perform tub/shower transfers with assist, with/without cues using equipment (OT) Flowsheets (Taken 01/24/2022 1254) LTG: Pt will perform tub/shower stall transfers with assistance level of: Independent with assistive device LTG: Pt will perform tub/shower transfers from: Walk in shower

## 2022-01-24 NOTE — Plan of Care (Signed)
  Problem: RH Balance Goal: LTG Patient will maintain dynamic standing balance (PT) Description: LTG:  Patient will maintain dynamic standing balance with assistance during mobility activities (PT) Flowsheets (Taken 01/24/2022 1800) LTG: Pt will maintain dynamic standing balance during mobility activities with:: Independent with assistive device    Problem: RH Bed to Chair Transfers Goal: LTG Patient will perform bed/chair transfers w/assist (PT) Description: LTG: Patient will perform bed to chair transfers with assistance (PT). Flowsheets (Taken 01/24/2022 1800) LTG: Pt will perform Bed to Chair Transfers with assistance level: Independent with assistive device    Problem: RH Car Transfers Goal: LTG Patient will perform car transfers with assist (PT) Description: LTG: Patient will perform car transfers with assistance (PT). Flowsheets (Taken 01/24/2022 1800) LTG: Pt will perform car transfers with assist:: Supervision/Verbal cueing   Problem: RH Furniture Transfers Goal: LTG Patient will perform furniture transfers w/assist (OT/PT) Description: LTG: Patient will perform furniture transfers  with assistance (OT/PT). Flowsheets (Taken 01/24/2022 1800) LTG: Pt will perform furniture transfers with assist:: Independent with assistive device    Problem: RH Ambulation Goal: LTG Patient will ambulate in controlled environment (PT) Description: LTG: Patient will ambulate in a controlled environment, # of feet with assistance (PT). Flowsheets (Taken 01/24/2022 1800) LTG: Pt will ambulate in controlled environ  assist needed:: Independent with assistive device LTG: Ambulation distance in controlled environment: 165f with LRAD Goal: LTG Patient will ambulate in home environment (PT) Description: LTG: Patient will ambulate in home environment, # of feet with assistance (PT). Flowsheets (Taken 01/24/2022 1800) LTG: Pt will ambulate in home environ  assist needed:: Independent with assistive  device LTG: Ambulation distance in home environment: 580fwith LRAD Goal: LTG Patient will ambulate in community environment (PT) Description: LTG: Patient will ambulate in community environment, # of feet with assistance (PT). Flowsheets (Taken 01/24/2022 1800) LTG: Pt will ambulate in community environ  assist needed:: Supervision/Verbal cueing LTG: Ambulation distance in community environment: 15088fith LRAD   Problem: RH Stairs Goal: LTG Patient will ambulate up and down stairs w/assist (PT) Description: LTG: Patient will ambulate up and down # of stairs with assistance (PT) Flowsheets (Taken 01/24/2022 1800) LTG: Pt will ambulate up/down stairs assist needed:: Supervision/Verbal cueing LTG: Pt will  ambulate up and down number of stairs: 1 step into house without rails   Problem: RH Pre-functional/Other (Specify) Goal: RH LTG PT (Specify) 1 Description:  RH LTG PT (Specify) 1 Flowsheets (Taken 01/24/2022 1800) LTG: Other PT (Specify) 1: Improve 5 times sit to stand to <15sec to demonstrate decreaesd fall risk

## 2022-01-24 NOTE — Progress Notes (Signed)
PROGRESS NOTE   Subjective/Complaints:  Had tmax 100.4 F last noc denies any new issues except cough   ROS- neg CP, SOB, N/V/D Objective:   No results found. Recent Labs    01/21/22 0837 01/24/22 0627  WBC  --  8.2  HGB 8.2* 10.0*  HCT 24.0* 31.2*  PLT  --  308   Recent Labs    01/22/22 0705 01/24/22 0627  NA 138 137  K 4.2 4.0  CL 102 102  CO2 21* 24  GLUCOSE 166* 134*  BUN 30* 36*  CREATININE 1.82* 2.20*  CALCIUM 8.5* 8.5*    Intake/Output Summary (Last 24 hours) at 01/24/2022 0820 Last data filed at 01/24/2022 0751 Gross per 24 hour  Intake --  Output 1 ml  Net -1 ml        Physical Exam: Vital Signs Blood pressure (!) 172/95, pulse 95, temperature 99.6 F (37.6 C), resp. rate 15, height '5\' 8"'$  (1.727 m), weight 62.5 kg, SpO2 98 %.  General: No acute distress Mood and affect are appropriate Heart: Regular rate and rhythm no rubs murmurs or extra sounds Lungs: Clear to auscultation, breathing unlabored, no rales or wheezes Abdomen: Positive bowel sounds, soft nontender to palpation, nondistended Extremities: No clubbing, cyanosis, or edema Skin: No evidence of breakdown, no evidence of rash Neurologic: Left CN 7 and 8 palsy, decreased auditory acuity Left ear, Left complete facial droop with incomplete lid closure ,   motor strength is 5/5 in bilateral deltoid, bicep, tricep, grip, hip flexor, knee extensors, ankle dorsiflexor and plantar flexor  Cerebellar exam normal finger to nose to finger as well as heel to shin in bilateral upper and lower extremities Musculoskeletal: Full range of motion in all 4 extremities. No joint swelling    Assessment/Plan: 1. Functional deficits which require 3+ hours per day of interdisciplinary therapy in a comprehensive inpatient rehab setting. Physiatrist is providing close team supervision and 24 hour management of active medical problems listed  below. Physiatrist and rehab team continue to assess barriers to discharge/monitor patient progress toward functional and medical goals  Care Tool:  Bathing              Bathing assist       Upper Body Dressing/Undressing Upper body dressing        Upper body assist      Lower Body Dressing/Undressing Lower body dressing            Lower body assist       Toileting Toileting    Toileting assist       Transfers Chair/bed transfer  Transfers assist           Locomotion Ambulation   Ambulation assist              Walk 10 feet activity   Assist           Walk 50 feet activity   Assist           Walk 150 feet activity   Assist           Walk 10 feet on uneven surface  activity   Assist  Wheelchair     Assist               Wheelchair 50 feet with 2 turns activity    Assist            Wheelchair 150 feet activity     Assist          Blood pressure (!) 172/95, pulse 95, temperature 99.6 F (37.6 C), resp. rate 15, height '5\' 8"'$  (1.727 m), weight 62.5 kg, SpO2 98 %.  Medical Problem List and Plan: 1. Functional deficits secondary to left craniotomy for left cystic vestibular schwannoma s/p resection on 01/21/2022             -patient may shower             -ELOS/Goals: 2 days, patient was unsure whether she wanted to come to rehab due to her $200/day copay. Discussed benefit of helping her to get to ModI level from her current Min-ModA and she was agreeable to come for 2 days 2.  Antithrombotics: -DVT/anticoagulation:  Pharmaceutical: Heparin             -antiplatelet therapy: aspirin 81 mg on hold for surgery 3. Pain Management: tylenol, Norco as needed 4. Mood/Behavior/Sleep: LCSW             -antipsychotic agents: n/a 5. Neuropsych/cognition: This patient is capable of making decisions on her own behalf. 6. Skin/Wound Care: Routine skin care checks             -monitor  surgical incision             -tape left eyelid shut every night at bedtime, remove in the morning 7. Fluids/Electrolytes/Nutrition: Routine Is and Os and follow-up chemistries 8: CKD stage 4, s/p renal allograft: baseline creatinine>>1.7-2.0             -continue chronic Bactrim Monday/Wednesday/Friday             -continue Prograf, Myfortic 9: DM-2: carb-modified diet, CBGs qAC and qHS; continue Tradjenta             -start SSI 10: Glaucoma: continue netarsudil-latanoprost drops (medication from home), Alphagan drops, artificial tears. Discussed with Katharine Look about speaking with pharmacy to help get her home drops started since she prefers this.  11: Hypertension: increase Lopressor to '25mg'$  BID. 12: Hypothyroidism: continue Synthroid 13. Tachycardia: Increase lopressor to '25mg'$  BID.  14.  Low grade fever asymptomatic , check UA, hx of renal transplant, WBC are normal, has low grade temp this am and has a cough but no other c/os will check CXR  LOS: 1 days A FACE TO FACE EVALUATION WAS PERFORMED  Charlett Blake 01/24/2022, 8:20 AM

## 2022-01-24 NOTE — Progress Notes (Signed)
OK to restart aspirin 81 mg daily per Dr. Zada Finders.

## 2022-01-25 DIAGNOSIS — D333 Benign neoplasm of cranial nerves: Secondary | ICD-10-CM | POA: Diagnosis not present

## 2022-01-25 LAB — GLUCOSE, CAPILLARY
Glucose-Capillary: 126 mg/dL — ABNORMAL HIGH (ref 70–99)
Glucose-Capillary: 113 mg/dL — ABNORMAL HIGH (ref 70–99)
Glucose-Capillary: 166 mg/dL — ABNORMAL HIGH (ref 70–99)
Glucose-Capillary: 109 mg/dL — ABNORMAL HIGH (ref 70–99)
Glucose-Capillary: 93 mg/dL (ref 70–99)

## 2022-01-25 MED ORDER — SODIUM CHLORIDE 0.45 % IV SOLN: INTRAVENOUS | Status: AC

## 2022-01-25 MED ORDER — ARTIFICIAL TEARS OPHTHALMIC OINT
TOPICAL_OINTMENT | Freq: Every day | OPHTHALMIC | Status: DC
  Filled 2022-01-25: qty 3.5

## 2022-01-25 NOTE — Progress Notes (Signed)
Occupational Therapy Session Note  Patient Details  Name: Michelle Rubio MRN: [id number] Date of Birth: 11/17/1950  Today's Date: 01/25/2022 OT Individual Time: 0830-0930 OT Individual Time Calculation (min): 60 min    Short Term Goals: No short term goals set  Skilled Therapeutic Interventions/Progress Updates:    Pt received in bed with son in room and eager for a shower.  Covered her IV site and pt moved about the room without an AD with close S.  She had 1-2 minor LOB when making sharp turns to the Left. Pt cued to turn head and scan L first as she tends to hold head straight.  With the head turn cue, pt did much better with no LOB.  Pt gathered clothing and completed toileting, shower and dressing all with close Supervision.   Adjusted handles of rollator to be longer for her tall height. Pt practiced ambulating in hallway with light CGA.  She practiced making L uturns 4x with cue to turn head to focus and pt did well with this.  Pt returned to room and resting in recliner with all needs met.  Belt alarm set.   Therapy Documentation Precautions:  Precautions Precautions: Fall Restrictions Weight Bearing Restrictions: No    Vital Signs: Therapy Vitals Temp: 100 F (37.8 C) Temp Source: Oral Pulse Rate: 91 Resp: 20 BP: 133/82 Patient Position (if appropriate): Lying Oxygen Therapy SpO2: 99 % O2 Device: Room Air Pain:  No c/o pain ADL: ADL Eating: Independent Grooming: Independent Where Assessed-Grooming: Sitting at sink Upper Body Bathing: Supervision/safety Where Assessed-Upper Body Bathing: Shower Lower Body Bathing: Supervision/safety Where Assessed-Lower Body Bathing: Shower Upper Body Dressing: Independent Where Assessed-Upper Body Dressing: Chair Lower Body Dressing: Supervision/safety Where Assessed-Lower Body Dressing: Chair Toileting: Supervision/safety Where Assessed-Toileting: Glass blower/designer: Close supervision Armed forces technical officer Method:  Magazine features editor: Close supervision Social research officer, government Method: Heritage manager: Radio broadcast assistant, Grab bars   Therapy/Group: Individual Therapy  [name] 01/25/2022, 8:44 AM

## 2022-01-25 NOTE — Progress Notes (Addendum)
PROGRESS NOTE   Subjective/Complaints:  Had tmax 100 F last noc denies any new issues except cough , CXR and WBC normal, UA neg except proteinuria (hx renal transplant)    ROS- neg CP, SOB, N/V/D Objective:   DG Chest 2 View  Result Date: 01/24/2022 CLINICAL DATA:  Cough in adult patient EXAM: CHEST - 2 VIEW COMPARISON:  None Available. FINDINGS: Normal mediastinum and cardiac silhouette. Normal pulmonary vasculature. No evidence of effusion, infiltrate, or pneumothorax. No acute bony abnormality. IMPRESSION: No acute cardiopulmonary process. Electronically Signed   By: Suzy Bouchard M.D.   On: 01/24/2022 10:03   Recent Labs    01/24/22 0627  WBC 8.2  HGB 10.0*  HCT 31.2*  PLT 308    Recent Labs    01/24/22 0627  NA 137  K 4.0  CL 102  CO2 24  GLUCOSE 134*  BUN 36*  CREATININE 2.20*  CALCIUM 8.5*     Intake/Output Summary (Last 24 hours) at 01/25/2022 0817 Last data filed at 01/25/2022 0755 Gross per 24 hour  Intake 840 ml  Output --  Net 840 ml         Physical Exam: Vital Signs Blood pressure 133/82, pulse 91, temperature 100 F (37.8 C), temperature source Oral, resp. rate 20, height '5\' 8"'$  (1.727 m), weight 62.5 kg, SpO2 99 %. HEENT scalp wound L post auricular CDI well approximated, looks great  General: No acute distress Mood and affect are appropriate Heart: Regular rate and rhythm no rubs murmurs or extra sounds Lungs: Clear to auscultation, breathing unlabored, no rales or wheezes Abdomen: Positive bowel sounds, soft nontender to palpation, nondistended Extremities: No clubbing, cyanosis, or edema Skin: No evidence of breakdown, no evidence of rash,  Neurologic: Left CN 7 and 8 palsy, decreased auditory acuity Left ear, Left complete facial droop with incomplete lid closure ,   motor strength is 5/5 in bilateral deltoid, bicep, tricep, grip, hip flexor, knee extensors, ankle dorsiflexor and  plantar flexor  Cerebellar exam normal finger to nose to finger as well as heel to shin in bilateral upper and lower extremities Musculoskeletal: Full range of motion in all 4 extremities. No joint swelling    Assessment/Plan: 1. Functional deficits which require 3+ hours per day of interdisciplinary therapy in a comprehensive inpatient rehab setting. Physiatrist is providing close team supervision and 24 hour management of active medical problems listed below. Physiatrist and rehab team continue to assess barriers to discharge/monitor patient progress toward functional and medical goals  Care Tool:  Bathing    Body parts bathed by patient: Right arm, Left arm, Chest, Abdomen, Front perineal area, Right upper leg, Left upper leg, Buttocks, Face, Left lower leg, Right lower leg         Bathing assist Assist Level: Supervision/Verbal cueing     Upper Body Dressing/Undressing Upper body dressing   What is the patient wearing?: Bra, Pull over shirt    Upper body assist Assist Level: Independent    Lower Body Dressing/Undressing Lower body dressing      What is the patient wearing?: Underwear/pull up, Pants     Lower body assist Assist for lower body dressing: Supervision/Verbal cueing  Toileting Toileting    Toileting assist Assist for toileting: Supervision/Verbal cueing     Transfers Chair/bed transfer  Transfers assist     Chair/bed transfer assist level: Minimal Assistance - Patient > 75%     Locomotion Ambulation   Ambulation assist      Assist level: Minimal Assistance - Patient > 75% Assistive device: Hand held assist Max distance: 150   Walk 10 feet activity   Assist     Assist level: Minimal Assistance - Patient > 75% Assistive device: Hand held assist   Walk 50 feet activity   Assist    Assist level: Minimal Assistance - Patient > 75% Assistive device: Hand held assist    Walk 150 feet activity   Assist    Assist  level: Minimal Assistance - Patient > 75% Assistive device: Hand held assist    Walk 10 feet on uneven surface  activity   Assist     Assist level: Minimal Assistance - Patient > 75% Assistive device: Hand held assist   Wheelchair     Assist Is the patient using a wheelchair?: No   Wheelchair activity did not occur: N/A         Wheelchair 50 feet with 2 turns activity    Assist    Wheelchair 50 feet with 2 turns activity did not occur: N/A       Wheelchair 150 feet activity     Assist  Wheelchair 150 feet activity did not occur: N/A       Blood pressure 133/82, pulse 91, temperature 100 F (37.8 C), temperature source Oral, resp. rate 20, height '5\' 8"'$  (1.727 m), weight 62.5 kg, SpO2 99 %.  Medical Problem List and Plan: 1. 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 Discussed benefit of helping her to get to ModI level from her current Min-ModA  2.  Antithrombotics: -DVT/anticoagulation:  Pharmaceutical: Heparin             -antiplatelet therapy: aspirin 81 mg on hold for surgery 3. Pain Management: tylenol, Norco as needed 4. Mood/Behavior/Sleep: LCSW             -antipsychotic agents: n/a 5. Neuropsych/cognition: This patient is capable of making decisions on her own behalf. 6. Skin/Wound Care: Routine skin care checks             -monitor surgical incision             -tape left eyelid shut every night at bedtime, remove in the morning 7. Fluids/Electrolytes/Nutrition: Routine Is and Os and follow-up chemistries 8: CKD stage 4, s/p renal allograft: baseline creatinine>>1.7-2.0, IVF x 2 d , recheck BMET SUnday              -continue chronic Bactrim Monday/Wednesday/Friday             -continue Prograf, Myfortic 9: DM-2: carb-modified diet, CBGs qAC and qHS; continue  Tradjenta             -start SSI 10: Glaucoma: continue netarsudil-latanoprost drops (medication from home), Alphagan drops, artificial tears. Discussed with Katharine Look about speaking with pharmacy to help get her home drops started since she prefers this.  11: Hypertension: increase Lopressor to '25mg'$  BID. 12: Hypothyroidism: continue  Synthroid 13. Tachycardia: Increase lopressor to '25mg'$  BID.  14.  Low grade fever asymptomatic , check UA, hx of renal transplant, WBC are normal, has low grade temp this am and has a cough but no other c/os will check CXR Order incentive spirometry  15.  CN 7 palsy lacrilube qhs , tape L eye shut  LOS: 2 days A FACE TO FACE EVALUATION WAS PERFORMED  Charlett Blake 01/25/2022, 8:17 AM

## 2022-01-25 NOTE — IPOC Note (Signed)
Overall Plan of Care [facility]) Patient Details Name: Michelle Rubio MRN: [id number] DOB: [birth year]  Admitting Diagnosis: Vestibular schwannoma [facility])  Hospital Problems: Principal Problem:   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[place])     Functional Problem List: Nursing Bladder, Bowel, Pain, Perception, Safety, Endurance, Sensory, Medication Management, Motor  PT Balance, Endurance, Motor  OT Balance  SLP    TR         Basic ADL's: OT Toileting, Dressing, Bathing     Advanced  ADL's: OT Simple Meal Preparation, Light Housekeeping     Transfers: PT Bed Mobility, Bed to Chair, Car, Furniture, Floor  OT Toilet, Tub/Shower     Locomotion: PT Ambulation, Stairs     Additional Impairments: OT None  SLP        TR      Anticipated Outcomes Item Anticipated Outcome  Self Feeding no goal, pt is independent  Swallowing      Basic self-care  Mod I  Toileting  Mod I   Bathroom Transfers Mod I  Bowel/Bladder  continent x 2  Transfers  Mod I with LRAD  Locomotion  Mod I with LRAD ambulatory  Communication     Cognition     Pain  less than 4  Safety/Judgment  remain fall free while in rehab   Therapy Plan: PT Intensity: Minimum of 1-2 x/day ,45 to 90 minutes PT Frequency: 5 out of 7 days PT Duration Estimated Length of Stay: 3-5 days OT Intensity: Minimum of 1-2 x/day, 45 to 90 minutes OT Frequency: 5 out of 7 days OT Duration/Estimated Length of Stay: 5-6 days     Team Interventions: Nursing Interventions Patient/Family Education, Disease Management/Prevention, Skin Care/Wound Management, Discharge Planning, Pain Management, Psychosocial Support, Bowel Management, Medication Management  PT interventions Ambulation/gait training, Discharge planning, Functional mobility training, Psychosocial support, Therapeutic Activities, Visual/perceptual remediation/compensation, Balance/vestibular training, Disease management/prevention, Neuromuscular re-education, Skin care/wound  management, Therapeutic Exercise, Wheelchair propulsion/positioning, Cognitive remediation/compensation, DME/adaptive equipment instruction, Pain management, UE/LE Strength taining/ROM, Community reintegration, Barrister's clerk education, IT trainer, UE/LE Coordination activities, Functional electrical stimulation  OT Interventions Training and development officer, Engineer, drilling, Discharge planning, Neuromuscular re-education, Functional mobility training, Patient/family education, Psychosocial support, Self Care/advanced ADL retraining, Therapeutic Activities, Therapeutic Exercise, UE/LE Strength taining/ROM, UE/LE Coordination activities  SLP Interventions    TR Interventions    SW/CM Interventions Discharge Planning, Psychosocial Support, Patient/Family Education, Disease Management/Prevention   Barriers to Discharge MD  Medical stability  Nursing Home environment access/layout, Lack of/limited family support, Medication compliance 1 level home with 1 ste; going home alone  PT Insurance for SNF coverage, Medication compliance    OT      SLP      SW Insurance for SNF coverage, Lack of/limited family support, Decreased caregiver support     Team Discharge Planning: Destination: PT-Home ,OT- Home , SLP-  Projected Follow-up: PT-Outpatient PT, OT-  Home health OT, SLP-  Projected Equipment Needs: PT-To be determined, OT- None recommended by OT, SLP-  Equipment Details: PT- , OT-  Patient/family involved in discharge planning: PT- Patient, Family member/caregiver,  OT-Patient, Family member/caregiver, SLP-   MD ELOS: 3-5d Medical Rehab Prognosis:  Good Assessment: The patient has been admitted for CIR therapies with the diagnosis of Left vestibular schwannoma resection. The team will be addressing functional mobility, strength, stamina, balance, safety, adaptive techniques and equipment, self-care, bowel and bladder mgt, patient and caregiver education, Work up of post op  fever. Goals have been set at Mod I. Anticipated discharge destination is Home.  See Team Conference Notes for weekly updates to the plan of care

## 2022-01-25 NOTE — Progress Notes (Signed)
Physical Therapy Session Note  Patient Details  Name: Michelle Rubio MRN: [id number] Date of Birth: 1950/10/31  Today's Date: 01/25/2022 PT Individual Time: 1056-1200 and 1535-1645 64 min 70 min      Short Term Goals: Week 1:  PT Short Term Goal 1 (Week 1): STG=LTG due to ELOS   Skilled Therapeutic Interventions/Progress Updates:  Session 1  Pt received sitting in recliner and agreeable to PT. Pt performed sit<>stand transfer with supervision assist throughout session with rollator and cues for Ad parts management. for safety.   Gait training through hall with Rollator with supervision assist 2 x 152f with cues for direction and decreased speed of turns to reduce fall risk.   Dynamic balance and coordination training in parallel bars: Foot tap on 6 inch cone performed x 10 BLE with UE supported on rails. And x 10 BLE with no UE support.  Foot tap on 1 of 2 cones randomly 2 x 10 BLE with UE supported on first bout and no UE supported on rails.  Tandem stance.  Standing on airex: ball lift to eye x 10. Lateral reach while holding ball with BUE x 15 bil. Ball catch x 12 with cues for increasing height of toss throughout   Dynamic gait training to perform figure 8 around 2 cones (2 x 4 bouts) and through agility ladder: 1 foot in each x 4 . Side stepping x2 bil. Diagonal stepping through rungs R and L x 4 bouts. CGA-min assist throughout    Patient returned to room and performed stand pivot to recliner with Rollator and cues for AD parts managemetn. Pt left sitting in recliner with call bell in reach and all needs met.     Session 2.   Pt received sitting in recliner and agreeable to PT. Pt report need for urination. Pt performed ambulatory transfer with rollator and supervision assist for safety. Able to perform void without assist as well as perform all clothing management without assist.   Hand hygiene performed with supervision assist with cues for posture and postiioning at  sink.   Gait training through hall with rollator and supervision assist from PT for safety with cues for improved step width and decreased speed in turns. Performed 3 x 1687f  Dynamic gait training in parallel bars:  Forward/reverse no UE support 6 x [id number]   Stepping over 2 and 4 in bolsters  Stepping over 2inch balance board, onto/off 5 inch step, and onto off ariex pad.  Min assist from PT for safety and cues for weight shift to the   Dynamic gait training in rehab gym: weave through 8 cones with Rollator and no AD.  Tandem gait with UE support on rollator4 x 15 ft. Diagonal stepping over line on floor 2 x [id number] 4 clockwise/counterclockwise x 4 each.  Gait through hall with head turns to identify squiqz on the R and L 40ft24f.  CGA for safety with cues for posture and step width R and L . Mild dizziness with turns to the L per pt report.   VOR  1 and VOR cancellation seated and standing. Performed each 20sec x 2 with mild dizziness in standing with VOR cancelation. Cues for decreaed speed of head movements and full ROM on the L            Therapy Documentation Precautions:  Precautions Precautions: Fall Restrictions Weight Bearing Restrictions: No  Vital Signs: Therapy Vitals Pulse Rate: (!) 107 BP: 136/68  Pain: Pain Assessment Pain Scale: 0-10 Pain Score: 0-No pain in session 1 and 2     Therapy/Group: Individual Therapy  Lorie Phenix 01/25/2022, 11:49 AM

## 2022-01-26 LAB — GLUCOSE, CAPILLARY
Glucose-Capillary: 102 mg/dL — ABNORMAL HIGH (ref 70–99)
Glucose-Capillary: 170 mg/dL — ABNORMAL HIGH (ref 70–99)
Glucose-Capillary: 115 mg/dL — ABNORMAL HIGH (ref 70–99)
Glucose-Capillary: 125 mg/dL — ABNORMAL HIGH (ref 70–99)

## 2022-01-26 NOTE — Progress Notes (Signed)
Dicussed how to use incentive spirometer and the purpose of it. Pt demonstrated an understanding on how to use it. Pt had no questions.  Leilani Able, Student-RN

## 2022-01-26 NOTE — Progress Notes (Signed)
Physical Therapy Session Note  Patient Details  Name: Michelle Rubio MRN: [id number] Date of Birth: Aug 23, 1950  Today's Date: 01/26/2022 PT Individual Time: 0917-1015 PT Individual Time Calculation (min): 58 min   Short Term Goals: Week 1:  PT Short Term Goal 1 (Week 1): STG=LTG due to ELOS  Skilled Therapeutic Interventions/Progress Updates:  Patient seated on toilet in bathroom on entrance to room. Patient alert and agreeable to PT session.   Patient with no pain complaint at start of session. Returns to bed with no AD and under supervision.   Therapeutic Activity: Bed Mobility: Pt performed supine <> sit with Mod I using bed features. No cueing required. Transfers: Pt performed sit<>stand and stand pivot transfers throughout session with supervision/ Mod I. Pt does require continued cues for setting brakes on rollator prior to transfer or transition in movement. Provided verbal cues for safe brake application <62% of attempts. Pt requests to shower.   Neuromuscular Re-ed: NMR facilitated during session with focus on dynamic balance and safety awareness of use of rollator.  Pt guided in ambulating room with rollator in order to demo needed item collection for shower. She demos ability to collect all clothing items, all soaps and lotions, towels and washcloths. Showers with IND not requiring any physical or supervisory assist. Showers safely standing up.  NMR performed for improvements in motor control and coordination, balance, sequencing, judgement, and self confidence/ efficacy in performing all aspects of mobility at highest level of independence.   Gait Training:  Pt ambulated >600 ft using RW  with overall supervision. Demonstrated slightly lower step height L to R. Improved during session. Pt guided in head turns, changes in speed, quick stops, turning corners in order to challenge balance as well as to assess rollator positioning and postural control. Provided vc/ tc for  maintaining upright posture and level gaze with good proximity to rollator without bearing weight into handles. Pt also educated on position of rollator just in front of wall (but not touching wall) when repositioning to sit for fatigue in order to use wall as safety backup in case brakes fail.   Patient seated in recliner at end of session with brakes locked, belt alarm set, and all needs within reach.   Therapy Documentation Precautions:  Precautions Precautions: Fall Restrictions Weight Bearing Restrictions: No General:   Vital Signs: Therapy Vitals Temp: 98.4 F (36.9 C) Pulse Rate: 79 Resp: 18 BP: (!) 141/67 Patient Position (if appropriate): Sitting Oxygen Therapy SpO2: 100 % O2 Device: Room Air Pain:    Therapy/Group: Individual Therapy  Alger Simons PT, DPT, CSRS 01/26/2022, 1:59 PM

## 2022-01-26 NOTE — Progress Notes (Signed)
Physical Therapy Session Note  Patient Details  Name: Michelle Rubio MRN: [id number] Date of Birth: Aug 24, 1950  Today's Date: 01/26/2022 PT Individual Time: 2774-1287 PT Individual Time Calculation (min): 64 min   Short Term Goals: Week 1:  PT Short Term Goal 1 (Week 1): STG=LTG due to ELOS  Skilled Therapeutic Interventions/Progress Updates:    Pt received sitting in recliner with her sister present and pt agreeable to therapy session. Sit<>stand transfers using rollator mod-I throughout session with proper use of brakes for safety during transfers without cuing. Gait in/out bathroom using rollator mod-I. Pt preformed 3/3 toileting tasks without assistance - continent of bladder. Standing hand hygiene at sink mod-I.   Notified nursing and then navigated outside. Gait training >372f to outside using rollator with distant supervision including managing opening room door and navigating on/off elevator all while managing AD and pt demoing good AD control.   Gait training outside ~[id number] using rollator including navigating unlevel surfaces with close supervision while educating pt on proper use of brakes to ensure safe AD control on declines. Performed curb step negotiation using rollator x5 reps during session to ensure pt with proper recall of sequencing safe AD management and use of brakes. Pt requires seated rest breaks between each gait trial due to impaired endurance.  Stair negotiation outside (6 steps, 4" height) using R UE support on 1 HR with pt starting with step-to pattern on descent and reciprocal on ascent progressing to reciprocal for both directions - CGA for safety throughout.   Gait training back up to CIR as described above.  Therapist provided pt with the following HEP handout and had pt perform 1 set of each exercise to ensure proper form/technique and safe set-up of environment. Supervision provided throughout for safety.   Access Code: [email or web address] URL:  [email or web address] Date: 01/26/2022 Prepared by: CaPage SpiroExercises - Sit to Stand Without Arm Support  - 1 x daily - 7 x weekly - 2 sets - 10 reps - Side Stepping with Resistance at Thighs and Counter Support  - 1 x daily - 7 x weekly - 2 sets - 10 reps - Backward Walking with Counter Support  - 1 x daily - 7 x weekly - 2 sets - 10 reps - Standing Tandem Balance with Counter Support  - 1 x daily - 7 x weekly - 2 sets - 2 reps - 30 seconds hold - Walking  - 1 x daily - 7 x weekly - 2 sets - 2 minutes hold   Gait back to her room using rollator mod-I. At end of session, pt left seated in recliner with needs in reach and seat belt alarm on.  Therapy Documentation Precautions:  Precautions Precautions: Fall Restrictions Weight Bearing Restrictions: No   Pain: No reports of pain throughout session.   Therapy/Group: Individual Therapy  CaTawana Scale PT, DPT, NCS, CSRS 01/26/2022, 12:57 PM

## 2022-01-27 DIAGNOSIS — D333 Benign neoplasm of cranial nerves: Secondary | ICD-10-CM | POA: Diagnosis not present

## 2022-01-27 LAB — BASIC METABOLIC PANEL
Anion gap: 13 (ref 5–15)
BUN: 36 mg/dL — ABNORMAL HIGH (ref 8–23)
CO2: 24 mmol/L (ref 22–32)
Calcium: 8.4 mg/dL — ABNORMAL LOW (ref 8.9–10.3)
Chloride: 98 mmol/L (ref 98–111)
Creatinine, Ser: 2.23 mg/dL — ABNORMAL HIGH (ref 0.44–1.00)
GFR, Estimated: 23 mL/min — ABNORMAL LOW (ref >60)
Glucose, Bld: 160 mg/dL — ABNORMAL HIGH (ref 70–99)
Potassium: 4 mmol/L (ref 3.5–5.1)
Sodium: 135 mmol/L (ref 135–145)

## 2022-01-27 LAB — GLUCOSE, CAPILLARY
Glucose-Capillary: 119 mg/dL — ABNORMAL HIGH (ref 70–99)
Glucose-Capillary: 137 mg/dL — ABNORMAL HIGH (ref 70–99)
Glucose-Capillary: 116 mg/dL — ABNORMAL HIGH (ref 70–99)
Glucose-Capillary: 124 mg/dL — ABNORMAL HIGH (ref 70–99)

## 2022-01-27 MED ORDER — POLYETHYLENE GLYCOL 3350 17 G PO PACK
17.0000 g | PACK | Freq: Every day | ORAL | Status: DC
  Administered 2022-01-27: 17 g via ORAL
  Filled 2022-01-27 (×2): qty 1

## 2022-01-27 NOTE — Progress Notes (Signed)
Occupational Therapy Discharge Summary  Patient Details  Name: Michelle Rubio MRN: [id number] Date of Birth: 1950-07-20  Date of Discharge from [place] [facility], 2023  Today's Date: 01/27/2022 OT Individual Time: 0845-1000 OT Individual Time Calculation (min): 75 min    Upon OT arrival, pt seated EOB finishing her breakfast. Pt agreeable to OT treatment session and reports no pain. Treatment intervention with a focus on self care retraining, dynamic standing balance, and endurance. Pt completes full shower ADL at the levels listed below. Pt performs functional mobility with 4WW independently and without RW Supervision-Independent. Pt engages in community mobility and ambulates within hospital and outside of hospital with 4WW and Mod I. Pt completes curb step with Mod I and no LOB. Pt takes a seated rest break and therapist provides active listening and therapeutic use of self while pt engages in conversation with this therapist. Pt ambulates back to her room with Mod I and no rest break and returns to her chair Mod I. Pt left in recliner at end of session with all needs met.   Patient has met 10 of 10 long term goals due to improved activity tolerance, improved balance, postural control, and improved coordination.  Patient to discharge at overall Modified Independent level.  Patient's care partner is independent to provide the necessary physical assistance at discharge.    Reasons goals not met: N/A  Recommendation:  Patient will not benefit from ongoing skilled OT services.   Equipment: No equipment provided  Reasons for discharge: treatment goals met  Patient/family agrees with progress made and goals achieved: Yes   OT Discharge Precautions/Restrictions  Precautions Precautions: Fall Restrictions Weight Bearing Restrictions: No  Pain Pain Assessment Pain Scale: 0-10 Pain Score: 0-No pain ADL ADL Eating: Independent Grooming: Independent Where Assessed-Grooming:  Standing at sink Upper Body Bathing: Independent Where Assessed-Upper Body Bathing: Shower Lower Body Bathing: Independent Where Assessed-Lower Body Bathing: Shower Upper Body Dressing: Independent Where Assessed-Upper Body Dressing: Chair Lower Body Dressing: Independent Where Assessed-Lower Body Dressing: Chair Toileting: Independent Where Assessed-Toileting: Glass blower/designer: Programmer, applications Method: Magazine features editor: IT consultant Method: Heritage manager: Gaffer Baseline Vision/History: 1 Wears glasses Patient Visual Report: Blurring of vision Vision Assessment?: Yes Eye Alignment: Impaired (comment) Ocular Range of Motion: Within Functional Limits Alignment/Gaze Preference: Within Defined Limits Tracking/Visual Pursuits: Able to track stimulus in all quads without difficulty Saccades: Additional eye shifts occurred during testing Visual Fields: Other (comment) Diplopia Assessment: Other (comment) Depth Perception: Undershoots Perception  Perception: Within Functional Limits Praxis Praxis: Intact Cognition Cognition Overall Cognitive Status: Within Functional Limits for tasks assessed Arousal/Alertness: Awake/alert Orientation Level: Person;Place;Situation Person: Oriented Place: Oriented Situation: Oriented Memory: Appears intact Awareness: Appears intact Problem Solving: Appears intact Safety/Judgment: Appears intact Brief Interview for Mental Status (BIMS) Repetition of Three Words (First Attempt): 3 Temporal Orientation: Year: Correct Temporal Orientation: Month: Accurate within 5 days Temporal Orientation: Day: Correct Recall: "Sock": Yes, no cue required Recall: "Blue": Yes, no cue required Recall: "Bed": Yes, no cue required BIMS Summary Score: 15 Sensation Sensation Light Touch: Appears Intact Hot/Cold: Appears Intact Proprioception: Appears  Intact Stereognosis: Appears Intact Coordination Gross Motor Movements are Fluid and Coordinated: No Fine Motor Movements are Fluid and Coordinated: Yes Coordination and Movement Description: decreased LE coordination impacting higher level balance Motor  Motor Motor: Ataxia Motor - Skilled Clinical Observations: LE ataxia Mobility  Bed Mobility Bed Mobility: Rolling Right;Rolling Left;Sit to Supine;Supine to Sit Rolling Right: Independent Rolling Left:  Independent Supine to Sit: Independent Sit to Supine: Independent Transfers Sit to Stand: Independent with assistive device  Trunk/Postural Assessment  Cervical Assessment Cervical Assessment: Within Functional Limits Thoracic Assessment Thoracic Assessment: Within Functional Limits Lumbar Assessment Lumbar Assessment: Within Functional Limits Postural Control Postural Control: Deficits on evaluation Righting Reactions: mild ataxia  Balance Balance Balance Assessed: Yes Static Sitting Balance Static Sitting - Level of Assistance: 7: Independent Dynamic Sitting Balance Dynamic Sitting - Level of Assistance: 7: Independent Dynamic Sitting - Balance Activities: Reaching for objects Static Standing Balance Static Standing - Level of Assistance: 7: Independent Dynamic Standing Balance Dynamic Standing - Level of Assistance: 7: Independent Dynamic Standing - Balance Activities: Reaching for objects Extremity/Trunk Assessment RUE Assessment RUE Assessment: Within Functional Limits LUE Assessment LUE Assessment: Within Functional Limits   Michelle Rubio 01/27/2022, 9:30 AM

## 2022-01-27 NOTE — Progress Notes (Signed)
Physical Therapy Session Note  Patient Details  Name: Michelle Rubio MRN: [id number] Date of Birth: [birth year]  Today's Date: 01/27/2022 PT Individual Time: 1052-1202 PT Individual Time Calculation (min): 70 min   Short Term Goals: Week 1:  PT Short Term Goal 1 (Week 1): STG=LTG due to ELOS  Skilled Therapeutic Interventions/Progress Updates:    Pt received sitting in recliner and agreeable to therapy session but reporting need to use bathroom. Sit>stand recliner>rollator mod-I, pt continues to do well self-correcting to ensure AD brakes are locked prior to initiating transfers. Gait in/out bathroom using rollator mod-I. Pt performed 3/3 toileting tasks mod-I - continent of bladder. Standing hand hygiene at sink mod-I managing rollator placement and brakes safely without cuing.  Gait training ~151f to main therapy gym using rollator mod-I.   Assessed gait speed while walking using rollator to be: 0.974m indicating community ambulator level  Pt participated in Functional Gait Assessment (FGA) without using an AD scoring 17/30 demonstrating high fall risk (low fall risk 25-28, medium fall risk 19-24, and high fall risk <19). Pt noticed to have the greatest balance impairments with tandem walking, walking eyes closed, and backwards walking as well as R/L lateral leaning/mild LOB during head turns. Pt educated on results of test and recommendation to use rollator for all mobility - pt in agreement.  Stair navigation training ascending/descending 4 steps x3 (6" height) using R UE support on each HR with supervision - pt self selecting reciprocal stepping pattern.  Gait training to/from ortho gym mod-I using rollator. Gait up/down ramp using rollator with supervision.   Simulated ambulatory car transfer using rollator with pt education on turning to sit down first then place her LEs into the vehicle - pt appreciative of education on safe technique.  Gait back to her room and pt left seated  in recliner with needs in reach. Discussed with team and pt made mod-I in room using rollator - therapist educated her of safety precautions, still calling for help if needed, and therapist assisted with arranging room to increase pt safety and access to necessities.   Therapy Documentation Precautions:  Precautions Precautions: Fall Restrictions Weight Bearing Restrictions: No  Pain: Denies pain during session.  Balance:  Functional Gait  Assessment Gait assessed : Yes Gait Level Surface: Walks 20 ft in less than 7 sec but greater than 5.5 sec, uses assistive device, slower speed, mild gait deviations, or deviates 6-10 in outside of the 12 in walkway width. (mild L lateral LOB) Change in Gait Speed: Able to change speed, demonstrates mild gait deviations, deviates 6-10 in outside of the 12 in walkway width, or no gait deviations, unable to achieve a major change in velocity, or uses a change in velocity, or uses an assistive device. Gait with Horizontal Head Turns: Performs head turns smoothly with slight change in gait velocity (eg, minor disruption to smooth gait path), deviates 6-10 in outside 12 in walkway width, or uses an assistive device. Gait with Vertical Head Turns: Performs task with slight change in gait velocity (eg, minor disruption to smooth gait path), deviates 6 - 10 in outside 12 in walkway width or uses assistive device Gait and Pivot Turn: Pivot turns safely in greater than 3 sec and stops with no loss of balance, or pivot turns safely within 3 sec and stops with mild imbalance, requires small steps to catch balance. Step Over Obstacle: Is able to step over one shoe box (4.5 in total height) without changing gait speed. No evidence of  imbalance. Gait with Narrow Base of Support: Ambulates 4-7 steps. Gait with Eyes Closed: Walks 20 ft, slow speed, abnormal gait pattern, evidence for imbalance, deviates 10-15 in outside 12 in walkway width. Requires more than 9 sec to  ambulate 20 ft. Ambulating Backwards: Walks 20 ft, slow speed, abnormal gait pattern, evidence for imbalance, deviates 10-15 in outside 12 in walkway width. Steps: Alternating feet, must use rail. Total Score: 17   Therapy/Group: Individual Therapy  Tawana Scale , PT, DPT, NCS, CSRS 01/27/2022, 7:59 AM

## 2022-01-27 NOTE — Progress Notes (Signed)
PROGRESS NOTE   Subjective/Complaints:  Still with low grade temp but asymptomatic  Not drinking a lot of fluids due to inability to get up to void  ROS- neg CP, SOB, N/V/D Objective:   No results found. No results for input(s): "WBC", "HGB", "HCT", "PLT" in the last 72 hours.  No results for input(s): "NA", "K", "CL", "CO2", "GLUCOSE", "BUN", "CREATININE", "CALCIUM" in the last 72 hours.   Intake/Output Summary (Last 24 hours) at 01/27/2022 0805 Last data filed at 01/27/2022 0735 Gross per 24 hour  Intake 970 ml  Output --  Net 970 ml         Physical Exam: Vital Signs Blood pressure (!) 158/79, pulse 74, temperature 99.6 F (37.6 C), resp. rate 18, height '5\' 8"'$  (1.727 m), weight 62.5 kg, SpO2 100 %. HEENT scalp wound L post auricular CDI well approximated, looks great  General: No acute distress Mood and affect are appropriate Heart: Regular rate and rhythm no rubs murmurs or extra sounds Lungs: Clear to auscultation, breathing unlabored, no rales or wheezes Abdomen: Positive bowel sounds, soft nontender to palpation, nondistended Extremities: No clubbing, cyanosis, or edema Skin: No evidence of breakdown, no evidence of rash,  Neurologic: Left CN 7 and 8 palsy, decreased auditory acuity Left ear, Left complete facial droop with incomplete lid closure ,   motor strength is 5/5 in bilateral deltoid, bicep, tricep, grip, hip flexor, knee extensors, ankle dorsiflexor and plantar flexor  Cerebellar exam normal finger to nose to finger as well as heel to shin in bilateral upper and lower extremities Musculoskeletal: Full range of motion in all 4 extremities. No joint swelling    Assessment/Plan: 1. Functional deficits which require 3+ hours per day of interdisciplinary therapy in a comprehensive inpatient rehab setting. Physiatrist is providing close team supervision and 24 hour management of active medical problems  listed below. Physiatrist and rehab team continue to assess barriers to discharge/monitor patient progress toward functional and medical goals  Care Tool:  Bathing    Body parts bathed by patient: Right arm, Left arm, Chest, Abdomen, Front perineal area, Right upper leg, Left upper leg, Buttocks, Face, Left lower leg, Right lower leg         Bathing assist Assist Level: Supervision/Verbal cueing     Upper Body Dressing/Undressing Upper body dressing   What is the patient wearing?: Bra, Pull over shirt    Upper body assist Assist Level: Independent    Lower Body Dressing/Undressing Lower body dressing      What is the patient wearing?: Underwear/pull up, Pants     Lower body assist Assist for lower body dressing: Supervision/Verbal cueing     Toileting Toileting    Toileting assist Assist for toileting: Supervision/Verbal cueing     Transfers Chair/bed transfer  Transfers assist     Chair/bed transfer assist level: Minimal Assistance - Patient > 75%     Locomotion Ambulation   Ambulation assist      Assist level: Minimal Assistance - Patient > 75% Assistive device: Hand held assist Max distance: 150   Walk 10 feet activity   Assist     Assist level: Minimal Assistance - Patient > 75%  Assistive device: Hand held assist   Walk 50 feet activity   Assist    Assist level: Minimal Assistance - Patient > 75% Assistive device: Hand held assist    Walk 150 feet activity   Assist    Assist level: Minimal Assistance - Patient > 75% Assistive device: Hand held assist    Walk 10 feet on uneven surface  activity   Assist     Assist level: Minimal Assistance - Patient > 75% Assistive device: Hand held assist   Wheelchair     Assist Is the patient using a wheelchair?: No   Wheelchair activity did not occur: N/A         Wheelchair 50 feet with 2 turns activity    Assist    Wheelchair 50 feet with 2 turns activity did not  occur: N/A       Wheelchair 150 feet activity     Assist  Wheelchair 150 feet activity did not occur: N/A       Blood pressure (!) 158/79, pulse 74, temperature 99.6 F (37.6 C), resp. rate 18, height '5\' 8"'$  (1.727 m), weight 62.5 kg, SpO2 100 %.  Medical Problem List and Plan: 1. 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  Antithrombotics: -DVT/anticoagulation:  Pharmaceutical: Heparin may d/c , amb >300' yesterday with PT              -antiplatelet therapy: aspirin 81 mg on hold for surgery 3. Pain Management: tylenol, Norco as needed 4. Mood/Behavior/Sleep: LCSW             -antipsychotic agents: n/a 5. Neuropsych/cognition: This patient is capable of making decisions on her own behalf. 6. Skin/Wound Care: Routine skin care checks             -monitor surgical incision             -tape left eyelid shut every night at bedtime, remove in the morning 7. Fluids/Electrolytes/Nutrition: Routine Is and Os and follow-up chemistries 8: CKD stage 4, s/p renal allograft: baseline creatinine>>1.7-2.0, IVF x 2 d , recheck BMET SUnday              -continue chronic Bactrim Monday/Wednesday/Friday             -continue Prograf, Myfortic 9: DM-2: carb-modified diet, CBGs qAC and qHS; continue Tradjenta             -start SSI 10: Glaucoma: continue netarsudil-latanoprost drops (medication from home), Alphagan drops, artificial tears. Discussed with Katharine Look about speaking with pharmacy to help get her home drops started since she prefers this.  11: Hypertension: increase Lopressor to '25mg'$  BID. 12: Hypothyroidism: continue Synthroid 13. Tachycardia: Increase lopressor to '25mg'$  BID.  14.  Low grade fever asymptomatic , check UA, hx of renal transplant, WBC are normal, has low grade temp this am and has a cough but no other  c/os will check CXR Order incentive spirometry     Latest Ref Rng & Units 01/24/2022    6:27 AM 01/21/2022    8:37 AM 01/18/2022   11:00 AM  CBC  WBC 4.0 - 10.5 K/uL 8.2   10.4   Hemoglobin 12.0 - 15.0 g/dL 10.0  8.2  10.4   Hematocrit  36.0 - 46.0 % 31.2  24.0  32.4   Platelets 150 - 400 K/uL 308   266     15.  CN 7 palsy lacrilube qhs , tape L eye shut  LOS: 4 days A FACE TO [place] [facility] [place] 01/27/2022, 8:05 AM

## 2022-01-27 NOTE — Discharge Summary (Signed)
Physical Therapy Discharge Summary  Patient Details  Name: Michelle Rubio MRN: [id number] Date of Birth: Apr 15, 1950  Date of Discharge from PT service:January 27, 2022  Patient has met 9 of 9 long term goals due to improved activity tolerance, improved balance, improved postural control, increased strength, ability to compensate for deficits, and improved coordination.  Patient to discharge at an ambulatory level Modified Independent using rollator for household mobility and supervision for community mobility. Patient's care partner is independent to provide the necessary physical assistance at discharge.  All goals met.  Recommendation:  Patient will benefit from ongoing skilled PT services in outpatient setting to continue to advance safe functional mobility, address ongoing impairments in high level dynamic standing balance, high level dynamic gait training with progression to further community ambulation, oculomotor retraining, activity tolerance, and minimize fall risk.  Equipment: No equipment provided, pt already has a rollator  Reasons for discharge: treatment goals met and discharge from hospital  Patient/family agrees with progress made and goals achieved: Yes  PT Discharge Precautions/Restrictions Precautions Precautions: Fall Restrictions Weight Bearing Restrictions: No Pain Pain Assessment Pain Scale: 0-10 Pain Score: 0-No pain Pain Interference Pain Interference Pain Effect on Sleep: 1. Rarely or not at all Pain Interference with Therapy Activities: 1. Rarely or not at all Pain Interference with Day-to-Day Activities: 1. Rarely or not at all Vision/Perception  Vision - History Ability to See in Adequate Light: 0 Adequate Vision - Assessment Eye Alignment: Impaired (comment) (L eye is not in perfect alignment with the R) Ocular Range of Motion: Within Functional Limits;Other (comment) (has bilateral end gaze nystagmus in all directions) Alignment/Gaze  Preference: Within Defined Limits Tracking/Visual Pursuits: Able to track stimulus in all quads without difficulty (has bilateral end gaze nystagmus in all directions) Saccades: Other (comment) (repeatedly blinks R eye between each movement and difficulty finding the target more noticeabley during vertical saccades) Diplopia Assessment: Other (comment) Perception Perception: Within Functional Limits Praxis Praxis: Intact  Cognition Overall Cognitive Status: Within Functional Limits for tasks assessed Arousal/Alertness: Awake/alert Orientation Level: Oriented X4 Attention: Focused;Sustained;Selective Focused Attention: Appears intact Sustained Attention: Appears intact Selective Attention: Appears intact Memory: Appears intact Awareness: Appears intact Problem Solving: Appears intact Safety/Judgment: Appears intact Sensation Sensation Light Touch: Appears Intact Hot/Cold: Not tested Proprioception: Appears Intact Stereognosis: Not tested Coordination Gross Motor Movements are Fluid and Coordinated: Yes Fine Motor Movements are Fluid and Coordinated: Yes Coordination and Movement Description: decreased LE coordination impacting higher level balance Heel Shin Test: WNL and symmetrical Motor  Motor Motor: Within Functional Limits Motor - Skilled Clinical Observations: LE ataxia Motor - Discharge Observations: WFL using rollator, without AD has R/L lateral LOB during dynamic gait tasks  Mobility Bed Mobility Bed Mobility: Supine to Sit;Sit to Supine Rolling Right: Independent Rolling Left: Independent Supine to Sit: Independent Sit to Supine: Independent Transfers Transfers: Sit to Stand;Stand to Sit;Stand Pivot Transfers Sit to Stand: Independent with assistive device Stand to Sit: Independent with assistive device Stand Pivot Transfers: Independent with assistive device Transfer (Assistive device): Rollator Locomotion  Gait Ambulation: Yes Gait Assistance: Independent  with assistive device;Supervision/Verbal cueing (mod-I in home and controlled environment but supervision in community) Gait Distance (Feet): 300 Feet Assistive device: Rollator Gait Assistance Details: Verbal cues for precautions/safety;Verbal cues for safe use of DME/AE Gait Gait: Yes Gait Pattern: Within Functional Limits Gait velocity: 0.70ms using rollator Stairs / Additional Locomotion Stairs: Yes Stairs Assistance: Contact Guard/Touching assist Stair Management Technique: One rail Right;Other (comment) (can perform 1 step (curb) using rollator  with CGA) Number of Stairs: 12 Height of Stairs: 6 Ramp: Supervision/Verbal cueing Curb: Contact Guard/Touching assist Wheelchair Mobility Wheelchair Mobility: No  Trunk/Postural Assessment  Cervical Assessment Cervical Assessment: Within Functional Limits Thoracic Assessment Thoracic Assessment: Within Functional Limits Lumbar Assessment Lumbar Assessment: Within Functional Limits Postural Control Postural Control: Within Functional Limits (using rollator) Righting Reactions: mild ataxia  Balance Balance Balance Assessed: Yes Standardized Balance Assessment Standardized Balance Assessment: Functional Gait Assessment Static Sitting Balance Static Sitting - Balance Support: Feet supported Static Sitting - Level of Assistance: 7: Independent Dynamic Sitting Balance Dynamic Sitting - Balance Support: Feet supported Dynamic Sitting - Level of Assistance: 7: Independent Dynamic Sitting - Balance Activities: Reaching for objects Static Standing Balance Static Standing - Balance Support: During functional activity Static Standing - Level of Assistance: 6: Modified independent (Device/Increase time) Dynamic Standing Balance Dynamic Standing - Balance Support: During functional activity Dynamic Standing - Level of Assistance: 6: Modified independent (Device/Increase time) (using rollator) Dynamic Standing - Balance Activities:  Reaching for objects Functional Gait  Assessment Gait assessed : Yes Gait Level Surface: Walks 20 ft in less than 7 sec but greater than 5.5 sec, uses assistive device, slower speed, mild gait deviations, or deviates 6-10 in outside of the 12 in walkway width. (mild L lateral LOB) Change in Gait Speed: Able to change speed, demonstrates mild gait deviations, deviates 6-10 in outside of the 12 in walkway width, or no gait deviations, unable to achieve a major change in velocity, or uses a change in velocity, or uses an assistive device. Gait with Horizontal Head Turns: Performs head turns smoothly with slight change in gait velocity (eg, minor disruption to smooth gait path), deviates 6-10 in outside 12 in walkway width, or uses an assistive device. Gait with Vertical Head Turns: Performs task with slight change in gait velocity (eg, minor disruption to smooth gait path), deviates 6 - 10 in outside 12 in walkway width or uses assistive device Gait and Pivot Turn: Pivot turns safely in greater than 3 sec and stops with no loss of balance, or pivot turns safely within 3 sec and stops with mild imbalance, requires small steps to catch balance. Step Over Obstacle: Is able to step over one shoe box (4.5 in total height) without changing gait speed. No evidence of imbalance. Gait with Narrow Base of Support: Ambulates 4-7 steps. Gait with Eyes Closed: Walks 20 ft, slow speed, abnormal gait pattern, evidence for imbalance, deviates 10-15 in outside 12 in walkway width. Requires more than 9 sec to ambulate 20 ft. Ambulating Backwards: Walks 20 ft, slow speed, abnormal gait pattern, evidence for imbalance, deviates 10-15 in outside 12 in walkway width. Steps: Alternating feet, must use rail. Total Score: 17 Extremity Assessment      RLE Assessment RLE Assessment: Within Functional Limits General Strength Comments: assesed to be 5/5 in sitting LLE Assessment LLE Assessment: Within Functional  Limits General Strength Comments: assessed in sitting LLE Strength Left Hip Flexion: 4+/5 Left Knee Flexion: 5/5 Left Knee Extension: 5/5 Left Ankle Dorsiflexion: 5/5 Left Ankle Plantar Flexion: 5/5   Carly M Pippin , PT, DPT, NCS, CSRS 01/27/2022, 7:55 AM

## 2022-01-27 NOTE — Progress Notes (Signed)
Physical Therapy Session Note  Patient Details  Name: Michelle Rubio MRN: [id number] Date of Birth: September 28, 1950  Today's Date: 01/27/2022 PT Individual Time: 1415-1527 PT Individual Time Calculation (min): 72 min   Short Term Goals: Week 1:  PT Short Term Goal 1 (Week 1): STG=LTG due to ELOS  Skilled Therapeutic Interventions/Progress Updates:    Chart reviewed and pt agreeable to therapy. Pt received standing in room with rollator with no c/o pain. Also of note, pt recently cleared for independent mobility in the room. Session focused on goal clearance in preparation of discharge, HEP review for continued progress, and functional gait tasks to continue gait improvements. Pt initiated session with 5x sit to stand, completing it in 14 secs. Pt and PT then reviewed HEP exercises with pt comprehending and demonstrating exercises in a home environment in the rehab apartment. Pt then ambulated >105f with rollator + ModI an completed amb with head turns t/o walk. Pt returned to therapy gym and completed 4x [id number] with no AD and SBA.  At end of session, pt was left seated in reclined with nurse call bell and all needs in reach.     Therapy Documentation Precautions:  Precautions Precautions: Fall Restrictions Weight Bearing Restrictions: No    Therapy/Group: Individual Therapy  KiMarquette OldPT, DPT 01/27/2022, 3:39 PM

## 2022-01-28 DIAGNOSIS — D333 Benign neoplasm of cranial nerves: Secondary | ICD-10-CM | POA: Diagnosis not present

## 2022-01-28 LAB — CBC
HCT: 29.1 % — ABNORMAL LOW (ref 36.0–46.0)
Hemoglobin: 9.2 g/dL — ABNORMAL LOW (ref 12.0–15.0)
MCH: 27.7 pg (ref 26.0–34.0)
MCHC: 31.6 g/dL (ref 30.0–36.0)
MCV: 87.7 fL (ref 80.0–100.0)
Platelets: 293 10*3/uL (ref 150–400)
RBC: 3.32 MIL/uL — ABNORMAL LOW (ref 3.87–5.11)
RDW: 13.1 % (ref 11.5–15.5)
WBC: 4.8 10*3/uL (ref 4.0–10.5)
nRBC: 0 % (ref 0.0–0.2)

## 2022-01-28 LAB — GLUCOSE, CAPILLARY: Glucose-Capillary: 104 mg/dL — ABNORMAL HIGH (ref 70–99)

## 2022-01-28 MED ORDER — ARTIFICIAL TEARS OPHTHALMIC OINT: TOPICAL_OINTMENT | Freq: Every day | OPHTHALMIC | 1 refills | Status: AC

## 2022-01-28 MED ORDER — FAMOTIDINE 10 MG PO TABS: 10.0000 mg | ORAL_TABLET | Freq: Every day | ORAL | Status: DC | PRN

## 2022-01-28 MED ORDER — DICLOFENAC SODIUM 1 % EX GEL: 2.0000 g | Freq: Every day | CUTANEOUS | Status: AC | PRN

## 2022-01-28 MED ORDER — ACETAMINOPHEN 325 MG PO TABS: 325.0000 mg | ORAL_TABLET | ORAL | Status: AC | PRN

## 2022-01-28 NOTE — Progress Notes (Signed)
PROGRESS NOTE   Subjective/Complaints: No events overnight. Patient feeling prepared for discharge, no concerns. Temp 99 Tmax overnight.   ROS: Denies fevers, chills, N/V, abdominal pain, constipation, diarrhea, SOB, cough, chest pain, new weakness or paraesthesias.    Objective:   No results found. Recent Labs    01/28/22 0550  WBC 4.8  HGB 9.2*  HCT 29.1*  PLT 293    Recent Labs    01/27/22 0835  NA 135  K 4.0  CL 98  CO2 24  GLUCOSE 160*  BUN 36*  CREATININE 2.23*  CALCIUM 8.4*     Intake/Output Summary (Last 24 hours) at 01/28/2022 1253 Last data filed at 01/28/2022 0700 Gross per 24 hour  Intake 838 ml  Output --  Net 838 ml         Physical Exam: Vital Signs Blood pressure (!) 149/82, pulse 75, temperature 99 F (37.2 C), resp. rate 17, height '5\' 8"'$  (1.727 m), weight 62.5 kg, SpO2 99 %.  HEENT scalp wound L post auricular CDI well approximated General: No acute distress Mood and affect are appropriate Heart: Regular rate and rhythm no rubs murmurs or extra sounds Lungs: Clear to auscultation, breathing unlabored, no rales or wheezes Abdomen: Positive bowel sounds, soft nontender to palpation, nondistended Extremities: No clubbing, cyanosis, or edema Skin: No evidence of breakdown, no evidence of rash,  Neurologic: Left CN 7 and 8 palsy, decreased auditory acuity Left ear, Left complete facial droop with incomplete lid closure, motor strength is 5-/5 in LUE, 5/5 LLE; 5/5 in RUE and RLE Musculoskeletal: Full range of motion in all 4 extremities. No joint swelling    Assessment/Plan: 1. Functional deficits which require 3+ hours per day of interdisciplinary therapy in a comprehensive inpatient rehab setting. Physiatrist is providing close team supervision and 24 hour management of active medical problems listed below. Physiatrist and rehab team continue to assess barriers to  discharge/monitor patient progress toward functional and medical goals  Care Tool:  Bathing    Body parts bathed by patient: Right arm, Left arm, Chest, Abdomen, Front perineal area, Right upper leg, Left upper leg, Buttocks, Face, Left lower leg, Right lower leg         Bathing assist Assist Level: Independent     Upper Body Dressing/Undressing Upper body dressing   What is the patient wearing?: Bra, Pull over shirt    Upper body assist Assist Level: Independent    Lower Body Dressing/Undressing Lower body dressing      What is the patient wearing?: Underwear/pull up, Pants     Lower body assist Assist for lower body dressing: Independent     Toileting Toileting    Toileting assist Assist for toileting: Independent     Transfers Chair/bed transfer  Transfers assist     Chair/bed transfer assist level: Independent with assistive device Chair/bed transfer assistive device: Other (rollator)   Locomotion Ambulation   Ambulation assist      Assist level: Independent with assistive device Assistive device: Rollator Max distance: 343f   Walk 10 feet activity   Assist     Assist level: Independent with assistive device Assistive device: Rollator   Walk 50 feet  activity   Assist    Assist level: Independent with assistive device Assistive device: Rollator    Walk 150 feet activity   Assist    Assist level: Independent with assistive device Assistive device: Rollator    Walk 10 feet on uneven surface  activity   Assist     Assist level: Supervision/Verbal cueing Assistive device: Rollator   Wheelchair     Assist Is the patient using a wheelchair?: No   Wheelchair activity did not occur: N/A         Wheelchair 50 feet with 2 turns activity    Assist    Wheelchair 50 feet with 2 turns activity did not occur: N/A       Wheelchair 150 feet activity     Assist  Wheelchair 150 feet activity did not occur:  N/A       Blood pressure (!) 149/82, pulse 75, temperature 99 F (37.2 C), resp. rate 17, height '5\' 8"'$  (1.727 m), weight 62.5 kg, SpO2 99 %.  Medical Problem List and Plan: 1. 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  Antithrombotics: -DVT/anticoagulation:  Pharmaceutical: Heparin may d/c , amb >300' yesterday with PT              -antiplatelet therapy: aspirin 81 mg on hold for surgery 3. Pain Management: tylenol, Norco as needed 4. Mood/Behavior/Sleep: LCSW             -antipsychotic agents: n/a 5. Neuropsych/cognition: This patient is capable of making decisions on her own behalf. 6. Skin/Wound Care: Routine skin care checks             -monitor surgical incision             -tape left eyelid shut every night at bedtime, remove in the morning 7. Fluids/Electrolytes/Nutrition: Routine Is and Os and follow-up chemistries 8: CKD stage 4, s/p renal allograft: baseline creatinine>>1.7-2.0, IVF x 2 d , recheck BMET SUnday              -continue chronic Bactrim Monday/Wednesday/Friday             -continue Prograf, Myfortic 9: DM-2: carb-modified diet, CBGs qAC and qHS; continue Tradjenta             -start SSI Recent Labs    01/27/22 1650 01/27/22 2047 01/28/22 0538  GLUCAP 116* 124* 104*     10"$ : Glaucoma: continue netarsudil-latanoprost drops (medication from home), Alphagan drops, artificial tears. Discussed with Katharine Look about speaking with pharmacy to help get her home drops started since she prefers this.  11: Hypertension: increase Lopressor to '25mg'$  BID. 12: Hypothyroidism: continue Synthroid 13. Tachycardia: Increase lopressor to '25mg'$  BID.  14.  Low grade fever asymptomatic , check UA, hx of renal transplant, WBC are normal, has low grade temp this am and has a cough but no  other c/os will check CXR Order incentive spirometry     Latest Ref Rng & Units 01/28/2022    5:50 AM 01/24/2022    6:27 AM 01/21/2022    8:37 AM  CBC  WBC 4.0 - 10.5 K/uL 4.8  8.2    Hemoglobin 12.0 - 15.0 g/dL 9.2  10.0  8.2  Hematocrit 36.0 - 46.0 % 29.1  31.2  24.0   Platelets 150 - 400 K/uL 293  308      15.  CN 7 palsy lacrilube qhs , tape L eye shut  LOS: 5 days A FACE TO FACE EVALUATION WAS PERFORMED  Gertie Gowda 01/28/2022, 12:53 PM

## 2022-01-28 NOTE — Progress Notes (Signed)
Inpatient Rehabilitation Care Coordinator Discharge Note   Patient Details  Name: Michelle Rubio MRN: [id number] Date of Birth: 10/04/1950   Discharge location: Home  Length of Stay: 5 Days  Discharge activity level: Supervision  Home/community participation: Sons, sister and other family/friends  Patient response KD:XIPJAS Literacy - How often do you need to have someone help you when you read instructions, pamphlets, or other written material from your doctor or pharmacy?: Never  Patient response NK:NLZJQB Isolation - How often do you feel lonely or isolated from those around you?: Never  Services provided included: SW, Pharmacy, TR, CM, RN, SLP, OT, PT, RD, MD  Financial Services:  Financial Services Utilized: Private Insurance HTA  Choices offered to/list presented to: pt  Follow-up services arranged:  Outpatient    Outpatient Servicies: Neuro Rehab      Patient response to transportation need: Is the patient able to respond to transportation needs?: Yes In the past 12 months, has lack of transportation kept you from medical appointments or from getting medications?: No In the past 12 months, has lack of transportation kept you from meetings, work, or from getting things needed for daily living?: No    Comments (or additional information):  Patient/Family verbalized understanding of follow-up arrangements:  Yes  Individual responsible for coordination of the follow-up plan: self  Confirmed correct DME delivered: Dyanne Iha 01/28/2022    Dyanne Iha

## 2022-01-28 NOTE — Progress Notes (Signed)
Inpatient Rehabilitation Discharge Medication Review by a Pharmacist  A complete drug regimen review was completed for this patient to identify any potential clinically significant medication issues.  High Risk Drug Classes Is patient taking? Indication by Medication  Antipsychotic No   Anticoagulant No   Antibiotic Yes Bactrim - PJP ppx  Opioid No   Antiplatelet Yes ASA81 - stroke ppx  Hypoglycemics/insulin Yes Sitagliptin - DM  Vasoactive Medication Yes Metoprolol - HTN  Chemotherapy No   Other Yes Tacrolimus, mycophenolate- immunosuppressant Levothyroxine - hypothyroidism Ezetimibe, fish oil - HLD Diclofenac - PRN pain Famotidine - GERD Brimonidine, Rocklatan - Glaucoma Trazodone- prn sleep Benadryl - prn itching Lacrilube, Liquifilm - PRN dry eyes     Type of Medication Issue Identified Description of Issue Recommendation(s)  Drug Interaction(s) (clinically significant)     Duplicate Therapy     Allergy     No Medication Administration End Date     Incorrect Dose     Additional Drug Therapy Needed     Significant med changes from prior encounter (inform family/care partners about these prior to discharge).  Communicate medication changes with patient/family at discharge  Other       Clinically significant medication issues were identified that warrant physician communication and completion of prescribed/recommended actions by midnight of the next day:  No   Pharmacist comments: n/a   Time spent performing this drug regimen review (minutes): 20   Thank you for allowing pharmacy to be a part of this patient's care.  Ardyth Harps, PharmD Clinical Pharmacist

## 2022-01-28 NOTE — Progress Notes (Signed)
Patient ID: Michelle Rubio, female   DOB: Jan 16, 1951, 71 y.o.   MRN: [id number]  Patient ready for discharge, no additional questions or concerns.

## 2022-01-29 ENCOUNTER — Encounter: Payer: PPO | Admitting: Physical Therapy

## 2022-01-31 ENCOUNTER — Ambulatory Visit: Payer: PPO | Attending: Neurology | Admitting: Physical Therapy

## 2022-01-31 ENCOUNTER — Encounter: Payer: Self-pay | Admitting: Physical Therapy

## 2022-01-31 DIAGNOSIS — D333 Benign neoplasm of cranial nerves: Secondary | ICD-10-CM | POA: Diagnosis not present

## 2022-01-31 DIAGNOSIS — R2681 Unsteadiness on feet: Secondary | ICD-10-CM | POA: Diagnosis not present

## 2022-01-31 DIAGNOSIS — R2689 Other abnormalities of gait and mobility: Secondary | ICD-10-CM | POA: Insufficient documentation

## 2022-01-31 DIAGNOSIS — Z9181 History of falling: Secondary | ICD-10-CM | POA: Insufficient documentation

## 2022-01-31 DIAGNOSIS — R42 Dizziness and giddiness: Secondary | ICD-10-CM | POA: Diagnosis not present

## 2022-01-31 LAB — TYPE AND SCREEN
ABO/RH(D): O POS
Antibody Screen: NEGATIVE
Unit division: 0
Unit division: 0
Unit division: 0
Unit division: 0

## 2022-01-31 LAB — BPAM RBC
Blood Product Expiration Date: 202311182359
Blood Product Expiration Date: 202311192359
Blood Product Expiration Date: 202311202359
Blood Product Expiration Date: 202311202359
ISSUE DATE / TIME: 202310281149
ISSUE DATE / TIME: 202310281251
Unit Type and Rh: 5100
Unit Type and Rh: 5100
Unit Type and Rh: 5100
Unit Type and Rh: 5100

## 2022-01-31 NOTE — Therapy (Signed)
OUTPATIENT PHYSICAL THERAPY NEURO EVALUATION   Patient Name: Michelle Rubio MRN: [id number] DOB:Apr 07, 1950, 71 y.o., female Today's Date: 01/31/2022   PCP: Lucianne Lei  REFERRING PROVIDER: Barbie Banner, PA-C    PT End of Session - 01/31/22 1059     Visit Number 1    Number of Visits 13    Date for PT Re-Evaluation 03/14/22    Authorization Type [employer]    Progress Note Due on Visit 10    PT Start Time 1014    PT Stop Time 1055    PT Time Calculation (min) 41 min    Equipment Utilized During Treatment Gait belt    Activity Tolerance Patient tolerated treatment well    Behavior During Therapy WFL for tasks assessed/performed             Past Medical History:  Diagnosis Date   Acute renal failure on dialysis ([place])    Body mass index (BMI) 20.0-20.9, adult    Chronic kidney disease    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   Diabetes mellitus without complication ([place])    type 2   Glaucoma    Gout    H/O Graves' disease 1980s   Hyperlipidemia    Hypertension    Hypothyroidism    Mitral valve prolapse    Past Surgical History:  Procedure Laterality Date   CRANIOTOMY Left 01/21/2022   Procedure: Left Craniotomy for Tumor Resection;  Surgeon: Judith Part, MD;  Location: [facility];  Service: Neurosurgery;  Laterality: Left;   KIDNEY TRANSPLANT  2012   THYROIDECTOMY, PARTIAL  1985   TONSILLECTOMY     TUBAL LIGATION     Patient Active Problem List   Diagnosis Date Noted   Status post craniotomy 01/21/2022   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[place]) 01/21/2022   Preop cardiovascular exam 12/10/2011   Abnormal electrocardiogram 12/10/2011   Hypertension    Hypothyroidism    Chronic kidney disease    H/O Graves' disease     ONSET DATE: 01/29/2022   REFERRING 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 Coming in here I felt a little bit of unsteadiness, at home I'm fine. I do have some blurry vision in my left eye after surgery. No room spinning, no dizziness. No falls or close calls. Family does not note anything specific that she struggles with besides an occasional mis-step when out of the home. Got the rollator a couple weeks before surgery but only use it now and then.   Pt accompanied by: family member  PERTINENT HISTORY: Clinical Impression: Patient is a 71 year old female who presented with symptomatic left cystic vestibular schwannoma.  She underwent left craniectomy with resection of her tumor on 01/21/2022 by Dr. Venetia Constable.  Her preoperative symptoms included left hearing loss and ataxia.  On postop day 0, her examination revealed left facial droop with complete eye closure. She was observed overnight in the ICU. House-Brackmann score 4 on POD 1.  Follow-up MRI of the brain revealed expected subtotal, 70% resection by volume with lower cranial nerve function intact.  Eyedrops continue and left lid taped closed at nighttime.  Of cutaneous heparin started for DVT prophylaxis.  Home aspirin 81 mg on hold. The patient requires inpatient physical medicine and rehabilitation evaluations and treatment secondary to dysfunction due to left cystic vestibular schwannoma that is postresection.   Past  medical history is significant for hypothyroidism, chronic kidney disease stage III of transplant allograft, diabetes mellitus type 2, hypothyroidism, hypertension, hyperlipidemia..  Patient transferred to CIR on 01/23/2022 .   PAIN:  Are you having pain? No  PRECAUTIONS: Fall and Other: impaired vision, recent hx craniectomy   WEIGHT BEARING RESTRICTIONS: No  FALLS: Has patient fallen in last 6 months? Yes. Number of falls 8 before surgery- "I don't know what happened"; (+) FOF especially before surgery but not to the point she is scared of leaving the home   LIVING ENVIRONMENT: Lives with: lives alone and family available to check on her frequently Lives in: Other townhouse  Stairs: 1 STE no rails, no steps inside  Has following equipment at home: Single point cane and Walker - 4 wheeled  PLOF: Independent, Independent with basic ADLs, Independent with gait, and Independent with transfers  PATIENT GOALS: get back to myself and be confident in balance/mobility   OBJECTIVE:   DIAGNOSTIC FINDINGS: CLINICAL DATA:  Follow-up debulking of vestibular schwannoma   EXAM: MRI HEAD WITHOUT AND WITH CONTRAST   TECHNIQUE: Multiplanar, multiecho pulse sequences of the brain and surrounding structures were obtained without and with intravenous contrast.   CONTRAST:  96m GADAVIST GADOBUTROL 1 MMOL/ML IV SOLN   COMPARISON:  12/24/2021   FINDINGS: Brain: Multifocal diffusion abnormality primarily along the falx cerebri, likely pneumocephalus. Small amount of acute ischemia along the left posterior resection site. Blood products at the resection site. 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 There is a small amount of residual contrast enhancement at the porus acusticus. Edema surrounding the resection site. There is multifocal periventricular white matter hyperintensity, most often a result of chronic  microvascular ischemia. Mild generalized volume loss. Mass effect on the brainstem is improved. There is improved patency of the cerebral aqueduct.   Vascular: Major flow voids are preserved.   Skull and upper cervical spine: Left retromastoid craniectomy.   Sinuses/Orbits:Left mastoid effusion. Paranasal sinuses are clear.  Normal orbits.   IMPRESSION: 1. Marked decrease in size of left cerebellopontine angle mass, now 2.8 x 2.1 x 2.9 cm, previously 3.5 x 2.8 x 3.5 cm. 2. Improved mass effect on the brainstem and cerebral aqueduct. 3. Small amount of acute ischemia along the left posterior resection site.    COGNITION: Overall cognitive status:  memory/recall/behavior WNL, does seem to be a bit impulsive and with poor safety awareness   SENSATION: Not tested reports no impairment   COORDINATION:   EDEMA:    MUSCLE TONE:   MUSCLE LENGTH:   DTRs:    POSTURE:   LOWER EXTREMITY ROM:     Active  Right Eval Left Eval  Hip flexion    Hip extension    Hip abduction    Hip adduction    Hip internal rotation    Hip external rotation    Knee flexion    Knee extension    Ankle dorsiflexion    Ankle plantarflexion    Ankle inversion    Ankle eversion     (Blank rows = not tested)  LOWER EXTREMITY MMT:    MMT Right Eval Left Eval  Hip flexion 4+ 4+  Hip extension 3+ 3+  Hip abduction 4+ 4+  Hip adduction    Hip internal rotation    Hip external rotation    Knee flexion 4+ 4+  Knee extension 5 5  Ankle dorsiflexion 5 5  Ankle plantarflexion    Ankle inversion    Ankle eversion    (Blank rows = not tested)  BED MOBILITY:  Sit to supine Complete Independence Supine to sit Complete Independence Rolling to Right Complete Independence Rolling to Left Complete Independence  TRANSFERS: Assistive device utilized: Michelle Rubio  Sit to stand: SBA Stand to sit: SBA   RAMP:    CURB:    STAIRS: Level of Assistance: Min A Stair Negotiation Technique: Step  to Pattern with No Rails Number of Stairs: 4  Height of Stairs: 6 inch   Comments: MinA for balance  GAIT: Gait pattern: decreased arm swing- Right, decreased arm swing- Left, trendelenburg, and trunk flexed Distance walked: 568f Assistive device utilized: Michelle Rubio Level of assistance: CGA Comments: mild unsteadiness on corners, no assist needed past min guard for safety   FUNCTIONAL TESTS:  3 minute walk test: [id number] device min guard, no rest RPE 0/10 Dynamic Gait Index: 14/24  PATIENT SURVEYS:    TODAY'S TREATMENT:                                                                                                                              DATE:   Eval  Nustep 6 minutes level 4 BLEs only        PATIENT EDUCATION: Education details: exam findings, POC, bring HEP from CIR so we can update this next session  Person educated: Patient and Child(ren) Education method: Explanation Education comprehension: verbalized understanding  HOME EXERCISE PROGRAM: Will update next  session when she brings existing HEP   GOALS: Goals reviewed with patient? Yes  SHORT TERM GOALS: Target date: 02/21/2022  Will be compliant with appropriate progressive HEP  Baseline: Goal status: INITIAL  2.  Will be able to name 3 ways to reduce fall risk at home and in the community  Baseline:  Goal status: INITIAL  3.  Will score at least 17 on DGI to show improved functional balance skills  Baseline:  Goal status: INITIAL   LONG TERM GOALS: Target date: 03/14/2022  Will be able to perform safe floor to stand transfers with no more than SBA  Baseline:  Goal status: INITIAL  2.  Will be able to walk outside over grass and gravel with LRAD and no more than SBA  Baseline:  Goal status: INITIAL  3.  Will score at least 20 on DGI to show improved functional balance  Baseline:  Goal status: INITIAL  4.  Will be compliant with appropriate gym based exercise program vs advanced HEP to  maintain functional gains  Baseline:  Goal status: INITIAL   ASSESSMENT:  CLINICAL IMPRESSION: Patient is a 71 y.o. F who was seen today for physical therapy evaluation and treatment for neoplasm of cranial nerves. Exam reveals significant functional balance impairment, mild impairment in safety awareness, and mild impairment in functional activity tolerance. Scored well on MMT. Did not report any dizziness today, did not assess vision or vestibular function in detail. Will benefit from skilled PT services to address existing impairments, reduce fall risk, and optimize level of function moving forward.   OBJECTIVE IMPAIRMENTS: Abnormal gait, decreased activity tolerance, decreased balance, decreased coordination, decreased knowledge of use of DME, and decreased mobility.   ACTIVITY LIMITATIONS: squatting, stairs, transfers, and locomotion level  PARTICIPATION LIMITATIONS: cleaning, laundry, driving, shopping, community activity, and yard work  PERSONAL FACTORS: Age, Behavior pattern, Fitness, Past/current experiences, and Time since onset of injury/illness/exacerbation are also affecting patient's functional outcome.   REHAB POTENTIAL: Good  CLINICAL DECISION MAKING: Evolving/moderate complexity  EVALUATION COMPLEXITY: Moderate  PLAN:  PT FREQUENCY: 1-2x/week  PT DURATION: 6 weeks  PLANNED INTERVENTIONS: Therapeutic exercises, Therapeutic activity, Neuromuscular re-education, Balance training, Gait training, Patient/Family education, Self Care, Joint mobilization, Stair training, Vestibular training, DME instructions, Taping, Biofeedback, Manual therapy, and Re-evaluation  PLAN FOR NEXT SESSION: focus on balance and functional activity tolerance    Simisola Sandles U PT DPT PN2  01/31/2022, 11:01 AM

## 2022-02-04 ENCOUNTER — Telehealth: Payer: Self-pay | Admitting: Physical Therapy

## 2022-02-04 ENCOUNTER — Ambulatory Visit: Payer: PPO | Admitting: Physical Therapy

## 2022-02-04 ENCOUNTER — Encounter: Payer: Self-pay | Admitting: Physical Therapy

## 2022-02-04 DIAGNOSIS — R2689 Other abnormalities of gait and mobility: Secondary | ICD-10-CM

## 2022-02-04 DIAGNOSIS — Z9181 History of falling: Secondary | ICD-10-CM

## 2022-02-04 DIAGNOSIS — R2681 Unsteadiness on feet: Secondary | ICD-10-CM

## 2022-02-04 NOTE — Therapy (Signed)
OUTPATIENT PHYSICAL THERAPY NEURO TREATMENT   Patient Name: Michelle Rubio MRN: [id number] DOB:11-Oct-1950, 71 y.o., female Today's Date: 02/04/2022   PCP: Lucianne Lei  REFERRING PROVIDER: Barbie Banner, PA-C    PT End of Session - 02/04/22 0913     Visit Number 1 (did not count today towards visit count)   Number of Visits 13    Date for PT Re-Evaluation 03/14/22    Authorization Type [employer]    Progress Note Due on Visit 10    PT Start Time 0847    PT Stop Time 0900    PT Time Calculation (min) 13 min    Activity Tolerance Patient tolerated treatment well    Behavior During Therapy [facility] for tasks assessed/performed              Past Medical History:  Diagnosis Date   Acute renal failure on dialysis ([place])    Body mass index (BMI) 20.0-20.9, adult    Chronic kidney disease    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   Diabetes mellitus without complication ([place])    type 2   Glaucoma    Gout    H/O Graves' disease 1980s   Hyperlipidemia    Hypertension    Hypothyroidism    Mitral valve prolapse    Past Surgical History:  Procedure Laterality Date   CRANIOTOMY Left 01/21/2022   Procedure: Left Craniotomy for Tumor Resection;  Surgeon: Judith Part, MD;  Location: [place];  Service: Neurosurgery;  Laterality: Left;   KIDNEY TRANSPLANT  2012   THYROIDECTOMY, PARTIAL  1985   TONSILLECTOMY     TUBAL LIGATION     Patient Active Problem List   Diagnosis Date Noted   Status post craniotomy 01/21/2022   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[place]) 01/21/2022   Preop cardiovascular exam 12/10/2011   Abnormal electrocardiogram 12/10/2011   Hypertension    Hypothyroidism    Chronic kidney disease    H/O Graves' disease     ONSET DATE: 01/29/2022   REFERRING DIAG: 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 I woke up this morning just not feeling myself. Drove myself here. I was ok last night no vision problems or anything I went to bed around 0930pm last night and I woke up like this. I feel like I'm having a hard time talking this morning too.   Pt accompanied by: family member  PERTINENT HISTORY: Clinical Impression: Patient is a 71 year old female who presented with symptomatic left cystic vestibular schwannoma.  She underwent left craniectomy with resection of her tumor on 01/21/2022 by Dr. Venetia Constable.  Her preoperative symptoms included left hearing loss and ataxia.  On postop day 0, her examination revealed left facial droop with complete eye closure. She was observed overnight in the ICU. House-Brackmann score 4 on POD 1.  Follow-up MRI of the brain revealed expected subtotal, 70% resection by volume with lower cranial nerve function intact.  Eyedrops continue and left lid taped closed at nighttime.  Of cutaneous heparin started for DVT prophylaxis.  Home aspirin 81 mg on hold. The patient requires inpatient physical medicine and rehabilitation evaluations and treatment secondary to dysfunction due to left cystic vestibular schwannoma that is postresection.   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  Patient transferred to CIR on 01/23/2022 .   PAIN:  Are you having pain? No  PRECAUTIONS: Fall and Other: impaired vision, recent hx craniectomy   WEIGHT BEARING RESTRICTIONS:  No  FALLS: Has patient fallen in last 6 months? Yes. Number of falls 8 before surgery- "I don't know what happened"; (+) FOF especially before surgery but not to the point she is scared of leaving the home   LIVING ENVIRONMENT: Lives with: lives alone and family available to check on her frequently Lives in: Other townhouse  Stairs: 1 STE no rails, no steps inside  Has following equipment at home: Single point cane and Walker - 4 wheeled  PLOF: Independent, Independent with basic ADLs, Independent with gait, and Independent with transfers  PATIENT GOALS: get back to myself and be confident in balance/mobility   OBJECTIVE:   DIAGNOSTIC FINDINGS: CLINICAL DATA:  Follow-up debulking of vestibular schwannoma   EXAM: MRI HEAD WITHOUT AND WITH CONTRAST   TECHNIQUE: Multiplanar, multiecho pulse sequences of the brain and surrounding structures were obtained without and with intravenous contrast.   CONTRAST:  49m GADAVIST GADOBUTROL 1 MMOL/ML IV SOLN   COMPARISON:  12/24/2021   FINDINGS: Brain: Multifocal diffusion abnormality primarily along the falx cerebri, likely pneumocephalus. Small amount of acute ischemia along the left posterior resection site. Blood products at the resection site. 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 There is a small amount of residual contrast enhancement at the porus acusticus. Edema surrounding the resection site. There is multifocal periventricular white matter hyperintensity, most often a result of chronic microvascular ischemia. Mild generalized volume loss. Mass effect on the brainstem is improved. There is improved patency of the cerebral aqueduct.   Vascular: Major flow voids are preserved.   Skull and upper cervical spine: Left retromastoid craniectomy.   Sinuses/Orbits:Left mastoid effusion. Paranasal sinuses are clear. Normal orbits.   IMPRESSION: 1.  Marked decrease in size of left cerebellopontine angle mass, now 2.8 x 2.1 x 2.9 cm, previously 3.5 x 2.8 x 3.5 cm. 2. Improved mass effect on the brainstem and cerebral aqueduct. 3. Small amount of acute  ischemia along the left posterior resection site.    COGNITION: Overall cognitive status:  memory/recall/behavior WNL, does seem to be a bit impulsive and with poor safety awareness   SENSATION: Not tested reports no impairment   COORDINATION:   EDEMA:    MUSCLE TONE:   MUSCLE LENGTH:   DTRs:    POSTURE:   LOWER EXTREMITY ROM:     Active  Right Eval Left Eval  Hip flexion    Hip extension    Hip abduction    Hip adduction    Hip internal rotation    Hip external rotation    Knee flexion    Knee extension    Ankle dorsiflexion    Ankle plantarflexion    Ankle inversion    Ankle eversion     (Blank rows = not tested)  LOWER EXTREMITY MMT:    MMT Right Eval Left Eval  Hip flexion 4+ 4+  Hip extension 3+ 3+  Hip abduction 4+ 4+  Hip adduction    Hip internal rotation    Hip external rotation    Knee flexion 4+ 4+  Knee extension 5 5  Ankle dorsiflexion 5 5  Ankle plantarflexion    Ankle inversion    Ankle eversion    (Blank rows = not tested)  BED MOBILITY:  Sit to supine Complete Independence Supine to sit Complete Independence Rolling to Right Complete Independence Rolling to Left Complete Independence  TRANSFERS: Assistive device utilized: None  Sit to stand: SBA Stand to sit: SBA   RAMP:    CURB:    STAIRS: Level of Assistance: Min A Stair Negotiation Technique: Step to Pattern with No Rails Number of Stairs: 4  Height of Stairs: 6 inch   Comments: MinA for balance  GAIT: Gait pattern: decreased arm swing- Right, decreased arm swing- Left, trendelenburg, and trunk flexed Distance walked: 567f Assistive device utilized: None Level of assistance: CGA Comments: mild unsteadiness on corners, no assist needed past min guard  for safety   FUNCTIONAL TESTS:  3 minute walk test: 5975fno device min guard, no rest RPE 0/10 Dynamic Gait Index: 14/24  PATIENT SURVEYS:    TODAY'S TREATMENT:                                                                                                                              DATE:   02/04/22  NEURO SCREEN  BP 124/67 HR 74 RAM mildly impaired in BLEs, BUEs ok  LTT intact B  UE grip strength/UE strength grossly symmetrical  LE strength equal and symmetrical  Visual screen grossly intact able to recognize correct # of numbers in all quadrants but blurred  L side of face drooping, L eye not closing like R  Mild-moderate drift to R with gait Speech a bit garbled this morning, not clear  Passed cognitive screen/STM screen   Eval  Nustep 6 minutes level 4 BLEs only  PATIENT EDUCATION: Education details: exam findings, POC, bring HEP from CIR so we can update this next session  Person educated: Patient and Child(ren) Education method: Explanation Education comprehension: verbalized understanding  HOME EXERCISE PROGRAM: Will update next session when she brings existing HEP   GOALS: Goals reviewed with patient? Yes  SHORT TERM GOALS: Target date: 02/21/2022  Will be compliant with appropriate progressive HEP  Baseline: Goal status: INITIAL  2.  Will be able to name 3 ways to reduce fall risk at home and in the community  Baseline:  Goal status: INITIAL  3.  Will score at least 17 on DGI to show improved functional balance skills  Baseline:  Goal status: INITIAL   LONG TERM GOALS: Target date: 03/14/2022  Will be able to perform safe floor to stand transfers with no more than SBA  Baseline:  Goal status: INITIAL  2.  Will be able to walk outside over grass and gravel with LRAD and no more than SBA  Baseline:  Goal status: INITIAL  3.  Will score at least 20 on DGI to show improved functional balance  Baseline:  Goal status: INITIAL  4.   Will be compliant with appropriate gym based exercise program vs advanced HEP to maintain functional gains  Baseline:  Goal status: INITIAL   ASSESSMENT:  CLINICAL IMPRESSION:  Ms. Hanel arrived today with noteable facial droop, garbled speech, significant change in gait mechanics, and vision change L eye. See CVA screen as above. Last known well time about 0930pm when she went to bed last night.   Given her presentation I am very concerned that she may have had a stroke or TIA, extensive education provided regarding presentation and importance of getting to ED as soon as possible for CVA workup/rule out.   I offered ambulance transport to her multiple times but she repeatedly refused this.  I walked her out to her care and expressly directed her to go straight to [facility] ED for CVA workup as it is just around the corner.  No charge for today's visit.   OBJECTIVE IMPAIRMENTS: Abnormal gait, decreased activity tolerance, decreased balance, decreased coordination, decreased knowledge of use of DME, and decreased mobility.   ACTIVITY LIMITATIONS: squatting, stairs, transfers, and locomotion level  PARTICIPATION LIMITATIONS: cleaning, laundry, driving, shopping, community activity, and yard work  PERSONAL FACTORS: Age, Behavior pattern, Fitness, Past/current experiences, and Time since onset of injury/illness/exacerbation are also affecting patient's functional outcome.   REHAB POTENTIAL: Good  CLINICAL DECISION MAKING: Evolving/moderate complexity  EVALUATION COMPLEXITY: Moderate  PLAN:  PT FREQUENCY: 1-2x/week  PT DURATION: 6 weeks  PLANNED INTERVENTIONS: Therapeutic exercises, Therapeutic activity, Neuromuscular re-education, Balance training, Gait training, Patient/Family education, Self Care, Joint mobilization, Stair training, Vestibular training, DME instructions, Taping, Biofeedback, Manual therapy, and Re-evaluation  PLAN FOR NEXT SESSION: focus on balance and  functional activity tolerance. Did she go to ED for w/u?    Ann Lions PT DPT PN2  02/04/2022, 9:14 AM

## 2022-02-04 NOTE — Telephone Encounter (Signed)
I called Ms. Longmore this afternoon to check on her- she tells me she did not go to the ED as I advised (went home instead) but she is feeling better. I again educated on importance of getting worked up to make sure she did not have acute CVA or TIA and possible poor outcomes of ignoring these symptoms.   Educated that we will need MD clearance before we will be able to see her in therapy given concerns found earlier today, she is in agreement.  Ann Lions PT DPT PN2

## 2022-02-06 ENCOUNTER — Ambulatory Visit: Payer: PPO | Admitting: Occupational Therapy

## 2022-02-11 ENCOUNTER — Ambulatory Visit: Payer: PPO | Admitting: Physical Therapy

## 2022-02-11 DIAGNOSIS — H02204 Unspecified lagophthalmos left upper eyelid: Secondary | ICD-10-CM | POA: Diagnosis not present

## 2022-02-18 ENCOUNTER — Encounter: Payer: Self-pay | Admitting: Physical Therapy

## 2022-02-18 ENCOUNTER — Ambulatory Visit: Payer: PPO | Admitting: Physical Therapy

## 2022-02-18 VITALS — BP 105/52 | HR 66

## 2022-02-18 DIAGNOSIS — R2689 Other abnormalities of gait and mobility: Secondary | ICD-10-CM

## 2022-02-18 DIAGNOSIS — Z9181 History of falling: Secondary | ICD-10-CM

## 2022-02-18 DIAGNOSIS — R2681 Unsteadiness on feet: Secondary | ICD-10-CM | POA: Diagnosis not present

## 2022-02-18 DIAGNOSIS — R42 Dizziness and giddiness: Secondary | ICD-10-CM

## 2022-02-18 NOTE — Therapy (Signed)
OUTPATIENT PHYSICAL THERAPY NEURO TREATMENT   Patient Name: OTTIE NEGLIA MRN: [id number] DOB:Apr 12, 1950, 71 y.o., female Today's Date: 02/18/2022   PCP: [place], [place] PROVIDER: Barbie Banner, PA-C    PT End of Session - 02/18/22 0849     Visit Number 2    Number of Visits 13    Date for PT Re-Evaluation 03/14/22    Authorization Type [employer]    Progress Note Due on Visit 10    PT Start Time 0847    PT Stop Time 0928    PT Time Calculation (min) 41 min    Activity Tolerance Patient tolerated treatment well    Behavior During Therapy [facility] for tasks assessed/performed              Past Medical History:  Diagnosis Date   Acute renal failure on dialysis ([place])    Body mass index (BMI) 20.0-20.9, adult    Chronic kidney disease    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   Diabetes mellitus without complication ([place])    type 2   Glaucoma    Gout    H/O Graves' disease 1980s   Hyperlipidemia    Hypertension    Hypothyroidism    Mitral valve prolapse    Past Surgical History:  Procedure Laterality Date   CRANIOTOMY Left 01/21/2022   Procedure: Left Craniotomy for Tumor Resection;  Surgeon: Judith Part, MD;  Location: [place];  Service: Neurosurgery;  Laterality: Left;   KIDNEY TRANSPLANT  2012   THYROIDECTOMY, PARTIAL  1985   TONSILLECTOMY     TUBAL LIGATION     Patient Active Problem List   Diagnosis Date Noted   Status post craniotomy 01/21/2022   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[place]) 01/21/2022   Preop cardiovascular exam 12/10/2011   Abnormal electrocardiogram 12/10/2011   Hypertension    Hypothyroidism    Chronic kidney disease    H/O Graves' disease     ONSET DATE: 01/29/2022   REFERRING DIAG: 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 Wants  her to keep working on the balance. Saw the eye doctor for her L eye and has some ointment to put in her L eye. Balance is feeling pretty good, reports nothing in particular makes her feel off balance.   Pt accompanied by: family member  PERTINENT HISTORY: Clinical Impression: Patient is a 71 year old female who presented with symptomatic left cystic vestibular schwannoma.  She underwent left craniectomy with resection of her tumor on 01/21/2022 by Dr. Venetia Constable.  Her preoperative symptoms included left hearing loss and ataxia.  On postop day 0, her examination revealed left facial droop with complete eye closure. She was observed overnight in the ICU. House-Brackmann score 4 on POD 1.  Follow-up MRI of the brain revealed expected subtotal, 70% resection by volume with lower cranial nerve function intact.  Eyedrops continue and left lid taped closed at nighttime.  Of cutaneous heparin started for DVT prophylaxis.  Home aspirin 81 mg on hold. The patient requires inpatient physical medicine and rehabilitation evaluations and treatment secondary to dysfunction due to left cystic vestibular schwannoma that is postresection.   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  Patient transferred to CIR on 01/23/2022 .   PAIN:  Are you having pain? No Vitals:   02/18/22 0857 02/18/22 0912 02/18/22 0928  BP: (!) 105/49 (!) 120/55 (!) 105/52  Pulse: 69 62 66   Pt reporting throughout session that she is  asymptomatic with no lightheadedness or dizziness.    PRECAUTIONS: Fall and Other: impaired vision, recent hx craniectomy   WEIGHT BEARING RESTRICTIONS: No  FALLS: Has patient fallen in last 6 months? Yes. Number of falls 8 before surgery- "I don't know what happened"; (+) FOF especially before surgery but not to the point she is scared of leaving the home   LIVING ENVIRONMENT: Lives with: lives alone and family available to check on her frequently Lives in: Other townhouse  Stairs: 1 STE no rails, no steps inside  Has following equipment at home: Single point cane and Walker - 4 wheeled  PLOF: Independent, Independent with basic ADLs, Independent with gait, and Independent with transfers  PATIENT GOALS: get back to myself and be confident in balance/mobility   OBJECTIVE:   GAIT: Gait pattern: decreased arm swing- Right, decreased arm swing- Left, trendelenburg, and trunk flexed Distance walked: Clinic distances in and out of session.  Assistive device utilized: None Level of assistance: SBA Comments: No unsteadiness or LOB.    TODAY'S TREATMENT:                                                                                                                              DATE: 02/18/22   Therapeutic Activity:  Pt arrived with low BP at rest, pt reporting that she is asymptomatic with no reports of lightheadedness or dizziness throughout session. BP closely monitored, rest breaks taken, and water provided throughout.  Discussed making sure that pt monitors her BP at home (pt needs to get a cuff) and to make an appt with her PCP (pt has not seen her PCP since being hospitalized, pt plans on calling and making an appt when pt leaves clinic today).   Access Code: [email or web address] URL: [email or web address] Date: 02/18/2022 Prepared by: Janann August  Reviewed and updated HEP from inpatient rehab. Pt has not been performing at home. See MedBridge for further details.    Exercises - Sit to Stand Without Arm Support  - 1 x daily - 7 x weekly - 2 sets - 10 reps - Side Stepping with Resistance at Thighs and Counter Support  - 1 x daily - 7 x weekly - 2 sets - 10 reps - with use of green tband, verbal/demo cues for proper technique  - Backward Walking with Counter Support  - 1 x daily - 7 x weekly - 2 sets - 10 reps - Standing Tandem Balance with Counter Support  - 1 x daily - 7 x weekly - 2 sets - 2 reps - 30 seconds hold - Walking  - 1 x daily - 7 x weekly - 2 sets - 2 minutes hold - Tandem Walking with Counter Support  - 1 x daily - 5 x weekly - 3 sets - Romberg Stance on Foam Pad with Head Rotation  - 1 x daily - 5 x weekly - 1-2 sets - 10 reps - performed with 10 reps head turns, 10 reps head nods      PATIENT EDUCATION: Education details: See therapeutic activity section. Reviewed/updated HEP.  Person educated: Patient Education method: Explanation, Demonstration, and Handouts Education comprehension: verbalized understanding  HOME EXERCISE PROGRAM: Will update next session when she brings existing HEP   GOALS: Goals reviewed with patient? Yes  SHORT TERM GOALS: Target date: 02/21/2022  Will be compliant with appropriate progressive HEP  Baseline: Goal status: INITIAL  2.  Will be able to name 3 ways to reduce fall risk at home and in the community  Baseline:  Goal status: INITIAL  3.  Will score at least 17 on DGI to show improved functional balance skills  Baseline:  Goal status: INITIAL   LONG TERM GOALS: Target date: 03/14/2022  Will be able to perform safe floor to stand transfers with no more than SBA  Baseline:  Goal status: INITIAL  2.  Will be able to walk outside over grass and gravel with LRAD and no more than SBA  Baseline:  Goal status: INITIAL  3.  Will score at least 20 on DGI to show improved functional balance  Baseline:  Goal status: INITIAL  4.  Will be compliant with appropriate gym based exercise program  vs advanced HEP to maintain functional gains  Baseline:  Goal status: INITIAL   ASSESSMENT:  CLINICAL IMPRESSION: Pt arrived with lower BP and vitals assessed throughout session (see above). Pt asymptomatic throughout and reporting no lightheadedness/dizziness. Seated rest breaks taken and water given during session. Since pt has not seen her PCP since being hospitalized, plans to make appt after leaving session today. Remainder of session focused on reviewing HEP given to pt in inpatient rehab for strength/balance (pt has not yet done at home) and updated to include additional balance tasks. Pt tolerated session well, will continue to progress towards LTGs.   OBJECTIVE IMPAIRMENTS: Abnormal gait, decreased activity tolerance, decreased balance, decreased coordination, decreased knowledge of use of DME, and decreased mobility.   ACTIVITY LIMITATIONS: squatting, stairs,  transfers, and locomotion level  PARTICIPATION LIMITATIONS: cleaning, laundry, driving, shopping, community activity, and yard work  PERSONAL FACTORS: Age, Behavior pattern, Fitness, Past/current experiences, and Time since onset of injury/illness/exacerbation are also affecting patient's functional outcome.   REHAB POTENTIAL: Good  CLINICAL DECISION MAKING: Evolving/moderate complexity  EVALUATION COMPLEXITY: Moderate  PLAN:  PT FREQUENCY: 1-2x/week  PT DURATION: 6 weeks  PLANNED INTERVENTIONS: Therapeutic exercises, Therapeutic activity, Neuromuscular re-education, Balance training, Gait training, Patient/Family education, Self Care, Joint mobilization, Stair training, Vestibular training, DME instructions, Taping, Biofeedback, Manual therapy, and Re-evaluation  PLAN FOR NEXT SESSION: Check STGs. Monitor BP. focus on balance and functional activity tolerance.    Janann August, PT, DPT 02/18/22 9:45 AM

## 2022-02-19 DIAGNOSIS — Z940 Kidney transplant status: Secondary | ICD-10-CM | POA: Diagnosis not present

## 2022-02-20 DIAGNOSIS — F4310 Post-traumatic stress disorder, unspecified: Secondary | ICD-10-CM | POA: Diagnosis not present

## 2022-02-25 ENCOUNTER — Encounter: Payer: Self-pay | Admitting: Physical Therapy

## 2022-02-25 ENCOUNTER — Ambulatory Visit: Payer: PPO | Admitting: Physical Therapy

## 2022-02-25 DIAGNOSIS — Z9181 History of falling: Secondary | ICD-10-CM

## 2022-02-25 DIAGNOSIS — R2681 Unsteadiness on feet: Secondary | ICD-10-CM | POA: Diagnosis not present

## 2022-02-25 DIAGNOSIS — R2689 Other abnormalities of gait and mobility: Secondary | ICD-10-CM

## 2022-02-25 NOTE — Therapy (Signed)
OUTPATIENT PHYSICAL THERAPY NEURO TREATMENT   Patient Name: Michelle Rubio MRN: [id number] DOB:[birth year], 71 y.o., female Today's Date: 02/25/2022   PCP: Lucianne Lei  REFERRING PROVIDER: Barbie Banner, PA-C    PT End of Session - 02/25/22 0959     Visit Number 3    Number of Visits 13    Date for PT Re-Evaluation 03/14/22    Authorization Type [employer]    Progress Note Due on Visit 10    PT Start Time 0933    PT Stop Time 1012    PT Time Calculation (min) 39 min    Activity Tolerance Patient tolerated treatment well    Behavior During Therapy [facility] for tasks assessed/performed               Past Medical History:  Diagnosis Date   Acute renal failure on dialysis ([place])    Body mass index (BMI) 20.0-20.9, adult    Chronic kidney disease    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   Diabetes mellitus without complication ([place])    type 2   Glaucoma    Gout    H/O Graves' disease 1980s   Hyperlipidemia    Hypertension    Hypothyroidism    Mitral valve prolapse    Past Surgical History:  Procedure Laterality Date   CRANIOTOMY Left 01/21/2022   Procedure: Left Craniotomy for Tumor Resection;  Surgeon: Judith Part, MD;  Location: [place];  Service: Neurosurgery;  Laterality: Left;   KIDNEY TRANSPLANT  2012   THYROIDECTOMY, PARTIAL  1985   TONSILLECTOMY     TUBAL LIGATION     Patient Active Problem List   Diagnosis Date Noted   Status post craniotomy 01/21/2022   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[place]) 01/21/2022   Preop cardiovascular exam 12/10/2011   Abnormal electrocardiogram 12/10/2011   Hypertension    Hypothyroidism    Chronic kidney disease    H/O Graves' disease     ONSET DATE: 01/29/2022   REFERRING DIAG: 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 Nothing new is going on, I didn't make an appointment with my PCP yet I need  to today. My eye is still giving me a fit.   Pt accompanied by: self  PERTINENT HISTORY: Clinical Impression: Patient is a 71 year old female who presented with symptomatic left cystic vestibular schwannoma.  She underwent left craniectomy with resection of her tumor on 01/21/2022 by Dr. Venetia Constable.  Her preoperative symptoms included left hearing loss and ataxia.  On postop day 0, her examination revealed left facial droop with complete eye closure. She was observed overnight in the ICU. House-Brackmann score 4 on POD 1.  Follow-up MRI of the brain revealed expected subtotal, 70% resection by volume with lower cranial nerve function intact.  Eyedrops continue and left lid taped closed at nighttime.  Of cutaneous heparin started for DVT prophylaxis.  Home aspirin 81 mg on hold. The patient requires inpatient physical medicine and rehabilitation evaluations and treatment secondary to dysfunction due to left cystic vestibular schwannoma that is postresection.   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  Patient transferred to CIR on 01/23/2022 .   PAIN:  Are you having pain? No There were no vitals filed for this visit.     PRECAUTIONS: Fall and Other: impaired vision, recent hx craniectomy   WEIGHT BEARING RESTRICTIONS: No  FALLS: Has patient fallen in last 6 months? Yes. Number of falls 8 before surgery- "I don't know what happened"; (+) FOF especially before surgery but not to the point she is  scared of leaving the home   LIVING ENVIRONMENT: Lives with: lives alone and family available to check on her frequently Lives in: Other townhouse  Stairs: 1 STE no rails, no steps inside  Has following equipment at home: Single point cane and Walker - 4 wheeled  PLOF: Independent, Independent with basic ADLs, Independent with gait, and Independent with transfers  PATIENT GOALS: get back to myself and be confident in balance/mobility   OBJECTIVE:   GAIT: Gait pattern: decreased arm swing- Right, decreased arm swing- Left, trendelenburg, and trunk flexed Distance walked: Clinic distances in and out of session.  Assistive device utilized: None Level of assistance: SBA Comments: No unsteadiness or LOB.    TODAY'S TREATMENT:                                                                                                                              DATE:   02/25/22  BP at rest 104/52 HR 65  NMR  Tandem stance on blue foam pad 3x30 seconds B  Stepping forward/back over blue foam pad x10 B Lateral toe taps off blue foam pad x10 B 3 way toe taps off blue foam pad x10 B  TherEx  10# KB 153f x2 (2 rounds, 4 laps total) switching KB each lap  3 way hip with BUE support red TB above knees x10 B    Access Code: [email or web address] URL: [email or web address] Date: 02/18/2022 Prepared by: CJanann August Reviewed and  updated HEP from inpatient rehab. Pt has not been performing at home. See MedBridge for further details.   Exercises - Sit to Stand Without Arm Support  - 1 x daily - 7 x weekly - 2 sets - 10 reps - Side Stepping with Resistance at Thighs and Counter Support  - 1 x daily - 7 x weekly - 2 sets - 10 reps - with use of green tband, verbal/demo cues for proper technique  - Backward Walking with Counter Support  - 1 x daily - 7 x weekly - 2 sets - 10 reps - Standing Tandem Balance with Counter Support  - 1 x daily - 7 x weekly - 2 sets - 2 reps - 30 seconds hold -  Walking  - 1 x daily - 7 x weekly - 2 sets - 2 minutes hold - Tandem Walking with Counter Support  - 1 x daily - 5 x weekly - 3 sets - Romberg Stance on Foam Pad with Head Rotation  - 1 x daily - 5 x weekly - 1-2 sets - 10 reps - performed with 10 reps head turns, 10 reps head nods      PATIENT EDUCATION: Education details: continued to encourage medical f/u for BP issues and knee pain  Person educated: Patient Education method: Explanation, Demonstration, and Handouts Education comprehension: verbalized understanding  HOME EXERCISE PROGRAM: Will update next session when she brings existing HEP   GOALS: Goals reviewed with patient? Yes  SHORT TERM GOALS: Target date: 02/21/2022  Will be compliant with appropriate progressive HEP  Baseline: Goal status: IN PROGRESS 11/27- still needs update, just starting to get back to HEP, unclear on when she started it again   2.  Will be able to name 3 ways to reduce fall risk at home and in the community  Baseline:  Goal status: IN PROGRESS 11/27 able to name 1/3   3.  Will score at least 17 on DGI to show improved functional balance skills  Baseline:  Goal status: IN PROGRESS 11/27- DNT    LONG TERM GOALS: Target date: 03/14/2022  Will be able to perform safe floor to stand transfers with no more than SBA  Baseline:  Goal status: INITIAL  2.  Will be able to walk outside over grass and gravel with LRAD and no more than SBA  Baseline:  Goal status: INITIAL  3.  Will score at least 20 on DGI to show improved functional balance  Baseline:  Goal status: INITIAL  4.  Will be compliant with appropriate gym based exercise program vs advanced HEP to maintain functional gains  Baseline:  Goal status: INITIAL   ASSESSMENT:  CLINICAL IMPRESSION:  Cyndia arrives today doing OK, BP was better today. Requested that we work on balance, did well with all interventions today. Needed a lot of education today as well due to low health  literacy. Will continue to progress as able.    OBJECTIVE IMPAIRMENTS: Abnormal gait, decreased activity tolerance, decreased balance, decreased coordination, decreased knowledge of use of DME, and decreased mobility.   ACTIVITY LIMITATIONS: squatting, stairs, transfers, and locomotion level  PARTICIPATION LIMITATIONS: cleaning, laundry, driving, shopping, community activity, and yard work  PERSONAL FACTORS: Age, Behavior pattern, Fitness, Past/current experiences, and Time since onset of injury/illness/exacerbation are also affecting patient's functional outcome.   REHAB POTENTIAL: Good  CLINICAL DECISION MAKING: Evolving/moderate complexity  EVALUATION COMPLEXITY: Moderate  PLAN:  PT FREQUENCY: 1-2x/week  PT DURATION: 6 weeks  PLANNED INTERVENTIONS: Therapeutic exercises, Therapeutic  activity, Neuromuscular re-education, Balance training, Gait training, Patient/Family education, Self Care, Joint mobilization, Stair training, Vestibular training, DME instructions, Taping, Biofeedback, Manual therapy, and Re-evaluation  PLAN FOR NEXT SESSION: Check DGI. Monitor BP. focus on balance and functional activity tolerance.    Valory Wetherby U PT DPT PN2  02/25/2022, 10:13 AM

## 2022-03-04 ENCOUNTER — Ambulatory Visit: Payer: PPO | Admitting: Physical Therapy

## 2022-03-11 ENCOUNTER — Encounter: Payer: Self-pay | Admitting: Physical Therapy

## 2022-03-11 ENCOUNTER — Ambulatory Visit: Payer: PPO | Attending: Neurology | Admitting: Physical Therapy

## 2022-03-11 VITALS — BP 137/68 | HR 55

## 2022-03-11 DIAGNOSIS — R2689 Other abnormalities of gait and mobility: Secondary | ICD-10-CM

## 2022-03-11 DIAGNOSIS — Z9181 History of falling: Secondary | ICD-10-CM | POA: Diagnosis not present

## 2022-03-11 DIAGNOSIS — R2681 Unsteadiness on feet: Secondary | ICD-10-CM

## 2022-03-11 NOTE — Therapy (Signed)
OUTPATIENT PHYSICAL THERAPY NEURO TREATMENT/RE-CERT   Patient Name: Michelle Rubio MRN: [id number] DOB:07-30-1950, 71 y.o., female Today's Date: 03/11/2022   PCP: Lucianne Lei  REFERRING PROVIDER: Barbie Banner, PA-C    PT End of Session - 03/11/22 1104     Visit Number 4    Number of Visits 9    Date for PT Re-Evaluation 05/10/22    Authorization Type [employer]    Progress Note Due on Visit 10    PT Start Time 1103    PT Stop Time 1143    PT Time Calculation (min) 40 min    Equipment Utilized During Treatment Gait belt    Activity Tolerance Patient tolerated treatment well    Behavior During Therapy WFL for tasks assessed/performed                Past Medical History:  Diagnosis Date   Acute renal failure on dialysis ([place])    Body mass index (BMI) 20.0-20.9, adult    Chronic kidney disease    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   Diabetes mellitus without complication ([place])    type 2   Glaucoma    Gout    H/O Graves' disease 1980s   Hyperlipidemia    Hypertension    Hypothyroidism    Mitral valve prolapse    Past Surgical History:  Procedure Laterality Date   CRANIOTOMY Left 01/21/2022   Procedure: Left Craniotomy for Tumor Resection;  Surgeon: Judith Part, MD;  Location: [place];  Service: Neurosurgery;  Laterality: Left;   KIDNEY TRANSPLANT  2012   THYROIDECTOMY, PARTIAL  1985   TONSILLECTOMY     TUBAL LIGATION     Patient Active Problem List   Diagnosis Date Noted   Status post craniotomy 01/21/2022   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[place]) 01/21/2022   Preop cardiovascular exam 12/10/2011   Abnormal electrocardiogram 12/10/2011   Hypertension    Hypothyroidism    Chronic kidney disease    H/O Graves' disease     ONSET DATE: 01/29/2022   REFERRING DIAG: 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 Will have an appt with his PCP on Wednesday. Really doesn't  think she needs therapy. Reports exercises are ok when she does them. Going to follow up with her doctor regarding her R knee.   Pt accompanied by: self  PERTINENT HISTORY: Clinical Impression: Patient is a 71 year old female who presented with symptomatic left cystic vestibular schwannoma.  She underwent left craniectomy with resection of her tumor on 01/21/2022 by Dr. Venetia Constable.  Her preoperative symptoms included left hearing loss and ataxia.  On postop day 0, her examination revealed left facial droop with complete eye closure. She was observed overnight in the ICU. House-Brackmann score 4 on POD 1.  Follow-up MRI of the brain revealed expected subtotal, 70% resection by volume with lower cranial nerve function intact.  Eyedrops continue and left lid taped closed at nighttime.  Of cutaneous heparin started for DVT prophylaxis.  Home aspirin 81 mg on hold. The patient requires inpatient physical medicine and rehabilitation evaluations and treatment secondary to dysfunction due to left cystic vestibular schwannoma that is postresection.   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  Patient transferred to CIR on 01/23/2022 .   PAIN:  Are you having pain? Yes R knee pain 4/10   Vitals:   03/11/22 1108  BP: 137/68  Pulse: (!) 55      PRECAUTIONS: Fall and Other: impaired vision, recent hx craniectomy   WEIGHT BEARING RESTRICTIONS: No  FALLS: Has patient fallen in last 6 months?  Yes. Number of falls 8 before surgery- "I don't know what happened"; (+) FOF especially before surgery but not to the point she is scared of leaving the home   LIVING ENVIRONMENT: Lives with: lives alone and family available to check on her frequently Lives in: Other townhouse  Stairs: 1 STE no rails, no steps inside  Has following equipment at home: Single point cane and Walker - 4 wheeled  PLOF: Independent, Independent with basic ADLs, Independent with gait, and Independent with transfers  PATIENT GOALS: get back to myself and be confident in balance/mobility   OBJECTIVE:   GAIT: Gait pattern: decreased arm swing- Right, decreased arm swing- Left, trendelenburg, and trunk flexed Distance walked: Clinic distances in and out of session.  Assistive device utilized: None Level of assistance: SBA Comments: No unsteadiness or LOB.    TODAY'S TREATMENT:                                                                                                                              DATE: 03/11/22  Therapeutic Activity: Goal Assessment: Performed floor <> stand transfer on mat with going into tall and then half kneel, with pt able to perform with supervision and pt verbalizes understanding of fall recovery strategies.     [facility] PT Assessment - 03/11/22 1113       Dynamic Gait Index   Level Surface Normal    Change in Gait Speed Normal    Gait  with Horizontal Head Turns Mild Impairment    Gait with Vertical Head Turns Mild Impairment    Gait and Pivot Turn Normal    Step Over Obstacle Mild Impairment    Step Around Obstacles Normal    Steps Mild Impairment    Total Score 20    DGI comment: 20/24      Functional Gait  Assessment   Gait assessed  Yes    Gait Level Surface Walks 20 ft in less than 5.5 sec, no assistive devices, good speed, no evidence for imbalance, normal gait pattern, deviates no more than 6 in outside of the 12 in walkway width.   5.4 seconds   Change in Gait Speed  Able to smoothly change walking speed without loss of balance or gait deviation. Deviate no more than 6 in outside of the 12 in walkway width.    Gait with Horizontal Head Turns Performs head turns smoothly with slight change in gait velocity (eg, minor disruption to smooth gait path), deviates 6-10 in outside 12 in walkway width, or uses an assistive device.    Gait with Vertical Head Turns Performs task with slight change in gait velocity (eg, minor disruption to smooth gait path), deviates 6 - 10 in outside 12 in walkway width or uses assistive device    Gait and Pivot Turn Pivot turns safely within 3 sec and stops quickly with no loss of balance.    Step Over Obstacle Is able to step over one shoe box (4.5 in total height) without changing gait speed. No evidence of imbalance.    Gait with Narrow Base of Support Ambulates 4-7 steps.    Gait with Eyes Closed Cannot walk 20 ft without assistance, severe gait deviations or imbalance, deviates greater than 15 in outside 12 in walkway width or will not attempt task.    Ambulating Backwards Walks 20 ft, uses assistive device, slower speed, mild gait deviations, deviates 6-10 in outside 12 in walkway width.   14.47 seconds   Steps Alternating feet, must use rail.    Total Score 20    FGA comment: 20/30 = Moderate Fall Risk            Pt reporting incr R knee pain with stairs and obstacles during testing. Pt is following up with her PCP regarding this.    M-CTSIB  Condition 1: Firm Surface, EO 30 Sec, Normal Sway  Condition 2: Firm Surface, EC 30 Sec, Normal Sway  Condition 3: Foam Surface, EO 30 Sec, Normal Sway  Condition 4: Foam Surface, EC 19 Sec, Mild Sway     NMR: Standing Balance: Surface: [employer] Position: Narrow Base of Support Completed with: Eyes Closed; slight space between feet 2 x 30 seconds, added to HEP    With feet apart, EC 2 sets of 10 reps head turns, 2 sets of 10 reps head nods    PATIENT EDUCATION: Education  details: Results of goals, POC going forwards and adding more visits and areas to work on in therapy. EC balance addition to HEP and where to purchase balance pad  Person educated: Patient Education method: Explanation, Demonstration, and Handouts Education comprehension: verbalized understanding  HOME EXERCISE PROGRAM:  Access Code: [email or web address] URL: [email or web address] Date: 02/18/2022 Prepared by: Janann August  Exercises - Sit to Stand Without Arm Support  - 1 x daily - 7 x weekly - 2 sets - 10 reps - Side Stepping with Resistance at Thighs and Counter Support  - 1 x daily - 7  x weekly - 2 sets - 10 reps - with use of green tband, verbal/demo cues for proper technique  - Backward Walking with Counter Support  - 1 x daily - 7 x weekly - 2 sets - 10 reps - Standing Tandem Balance with Counter Support  - 1 x daily - 7 x weekly - 2 sets - 2 reps - 30 seconds hold - Walking  - 1 x daily - 7 x weekly - 2 sets - 2 minutes hold - Tandem Walking with Counter Support  - 1 x daily - 5 x weekly - 3 sets - Romberg Stance on Foam Pad with Head Rotation  - 1 x daily - 5 x weekly - 1-2 sets - 10 reps - performed with 10 reps head turns, 10 reps head nods   New addition on 03/11/22:   - Romberg Stance Eyes Closed on Foam Pad  - 1 x daily - 5 x weekly - 3 sets - 30 hold  GOALS: Goals reviewed with patient? Yes  SHORT TERM GOALS: Target date: 02/21/2022  Will be compliant with appropriate progressive HEP  Baseline: Goal status: IN PROGRESS 11/27- still needs update, just starting to get back to HEP, unclear on when she started it again   2.  Will be able to name 3 ways to reduce fall risk at home and in the community  Baseline:  Goal status: IN PROGRESS 11/27 able to name 1/3   3.  Will score at least 17 on DGI to show improved functional balance skills  Baseline:  Goal status: IN PROGRESS 11/27- DNT    LONG TERM GOALS: Target date: 03/14/2022  Will be able to perform safe  floor to stand transfers with no more than SBA  Baseline:  Goal status: MET  2.  Will be able to walk outside over grass and gravel with LRAD and no more than SBA  Baseline:  Goal status: ON-GOING  3.  Will score at least 20 on DGI to show improved functional balance  Baseline: 20/24 on 03/11/22  Goal status: MET  4.  Will be compliant with appropriate gym based exercise program vs advanced HEP to maintain functional gains  Baseline: pt performing exercises sometimes  Goal status: PARTIALLY MET   UPDATED/ONGOING LTGS FOR RE-CERT: LONG TERM GOALS: Target date:  04/08/2022  1.  Will be able to walk outside over grass and gravel with LRAD and no more than SBA  Baseline:  Goal status: ON-GOING  2.  Pt will improve FGA to at least a 24/30 in order to demo decr fall risk. Baseline: 20/30 on 03/11/22 Goal status: NEW  3.  Pt will improve condition 4 of mCTSIB for at least 30 seconds in order to demo improved vestibular input for balance.  Baseline: 19 seconds Goal status: NEW  ASSESSMENT:  CLINICAL IMPRESSION:  Today's skilled session focused on assessing pt's LTGs. Pt met LTGs #1 and #3. Pt partially met LTG #4 in regards to HEP. Pt improved DGI to a 20/24 (previously 14/24). Assessed FGA with pt scoring a 20/30, indicating a moderate fall risk. Pt reporting incr R knee pain with obstacles and stairs. Pt plans to see PCP regarding this. Performed the mCTSIB with pt only able to hold condition 4 for 19 seconds, indicating decr vestibular input for balance. Pt will benefit from continued skilled PT to address balance, functional strength, gait in order to decr fall risk and improve functional mobility. LTGs updated as appropriate.    OBJECTIVE IMPAIRMENTS:  Abnormal gait, decreased activity tolerance, decreased balance, decreased coordination, decreased knowledge of use of DME, and decreased mobility.   ACTIVITY LIMITATIONS: squatting, stairs, transfers, and locomotion  level  PARTICIPATION LIMITATIONS: cleaning, laundry, driving, shopping, community activity, and yard work  PERSONAL FACTORS: Age, Behavior pattern, Fitness, Past/current experiences, and Time since onset of injury/illness/exacerbation are also affecting patient's functional outcome.   REHAB POTENTIAL: Good  CLINICAL DECISION MAKING: Evolving/moderate complexity  EVALUATION COMPLEXITY: Moderate  PLAN:  PT FREQUENCY: 1x/week  PT DURATION: 6 weeks  PLANNED INTERVENTIONS: Therapeutic exercises, Therapeutic activity, Neuromuscular re-education, Balance training, Gait training, Patient/Family education, Self Care, Joint mobilization, Stair training, Vestibular training, DME instructions, Taping, Biofeedback, Manual therapy, and Re-evaluation  PLAN FOR NEXT SESSION: Work on balance - EC, narrow BOS, tandem gait, head motions, obstacle negotiation. Functional strength.     Janann August, PT, DPT 03/11/22 11:56 AM

## 2022-03-12 DIAGNOSIS — E782 Mixed hyperlipidemia: Secondary | ICD-10-CM | POA: Diagnosis not present

## 2022-03-12 DIAGNOSIS — E1169 Type 2 diabetes mellitus with other specified complication: Secondary | ICD-10-CM | POA: Diagnosis not present

## 2022-03-12 DIAGNOSIS — I10 Essential (primary) hypertension: Secondary | ICD-10-CM | POA: Diagnosis not present

## 2022-03-13 DIAGNOSIS — Z48811 Encounter for surgical aftercare following surgery on the nervous system: Secondary | ICD-10-CM | POA: Diagnosis not present

## 2022-03-18 DIAGNOSIS — H02204 Unspecified lagophthalmos left upper eyelid: Secondary | ICD-10-CM | POA: Diagnosis not present

## 2022-03-19 ENCOUNTER — Ambulatory Visit: Payer: PPO | Admitting: Physical Therapy

## 2022-03-19 ENCOUNTER — Encounter: Payer: Self-pay | Admitting: Physical Therapy

## 2022-03-19 ENCOUNTER — Ambulatory Visit: Payer: PPO | Admitting: Neurology

## 2022-03-19 ENCOUNTER — Encounter: Payer: Self-pay | Admitting: Neurology

## 2022-03-19 VITALS — BP 162/83 | HR 71 | Ht 68.0 in | Wt 139.0 lb

## 2022-03-19 DIAGNOSIS — R269 Unspecified abnormalities of gait and mobility: Secondary | ICD-10-CM | POA: Diagnosis not present

## 2022-03-19 DIAGNOSIS — R2681 Unsteadiness on feet: Secondary | ICD-10-CM | POA: Diagnosis not present

## 2022-03-19 DIAGNOSIS — D333 Benign neoplasm of cranial nerves: Secondary | ICD-10-CM | POA: Diagnosis not present

## 2022-03-19 DIAGNOSIS — R2689 Other abnormalities of gait and mobility: Secondary | ICD-10-CM

## 2022-03-19 DIAGNOSIS — G510 Bell's palsy: Secondary | ICD-10-CM

## 2022-03-19 NOTE — Therapy (Signed)
OUTPATIENT PHYSICAL THERAPY NEURO TREATMENT   Patient Name: Michelle Rubio MRN: [id number] DOB:Feb 09, 1951, 71 y.o., female Today's Date: 03/19/2022   PCP: Lucianne Lei  REFERRING PROVIDER: Barbie Banner, PA-C    PT End of Session - 03/19/22 1620     Visit Number 5    Number of Visits 9    Date for PT Re-Evaluation 05/10/22    Authorization Type [employer]    Progress Note Due on Visit 10    PT Start Time 1406    PT Stop Time 1425   pt stated she had appt at [place] at 2:30 - needed to leave early   PT Time Calculation (min) 19 min    Activity Tolerance Patient tolerated treatment well    Behavior During Therapy [facility] for tasks assessed/performed                 Past Medical History:  Diagnosis Date   Acute renal failure on dialysis ([place])    Body mass index (BMI) 20.0-20.9, adult    Chronic kidney disease    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   Diabetes mellitus without complication ([place])    type 2   Glaucoma    Gout    H/O Graves' disease 1980s   Hyperlipidemia    Hypertension    Hypothyroidism    Mitral valve prolapse    Past Surgical History:  Procedure Laterality Date   CRANIOTOMY Left 01/21/2022   Procedure: Left Craniotomy for Tumor Resection;  Surgeon: Judith Part, MD;  Location: [facility];  Service: Neurosurgery;  Laterality: Left;   KIDNEY TRANSPLANT  2012   THYROIDECTOMY, PARTIAL  1985   TONSILLECTOMY     TUBAL LIGATION     Patient Active Problem List   Diagnosis Date Noted   Status post craniotomy 01/21/2022   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[place]) 01/21/2022   Preop cardiovascular exam 12/10/2011   Abnormal electrocardiogram 12/10/2011   Hypertension    Hypothyroidism    Chronic kidney disease    H/O Graves' disease     ONSET DATE: 01/29/2022   REFERRING DIAG: 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 April Manson at 2:30 - needs  to leave early today, by 2:25 if possible; pt states she is doing OK - reports no dizziness today; states she is having problem with her Rt eye   Pt accompanied by: self  PERTINENT HISTORY: Clinical Impression: Patient is a 71 year old female who presented with symptomatic left cystic vestibular schwannoma.  She underwent left craniectomy with resection of her tumor on 01/21/2022 by Dr. Venetia Constable.  Her preoperative symptoms included left hearing loss and ataxia.  On postop day 0, her examination revealed left facial droop with complete eye closure. She was observed overnight in the ICU. House-Brackmann score 4 on POD 1.  Follow-up MRI of the brain revealed expected subtotal, 70% resection by volume with lower cranial nerve function intact.  Eyedrops continue and left lid taped closed at nighttime.  Of cutaneous heparin started for DVT prophylaxis.  Home aspirin 81 mg on hold. The patient requires inpatient physical medicine and rehabilitation evaluations and treatment secondary to dysfunction due to left cystic vestibular schwannoma that is postresection.   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  Patient transferred to CIR on 01/23/2022 .   PAIN:  Are you having pain? Yes R knee pain 4/10   There were no vitals filed for this visit.     PRECAUTIONS: Fall and Other: impaired vision, recent hx craniectomy   WEIGHT BEARING RESTRICTIONS: No  FALLS: Has patient fallen in last 6 months? Yes. Number of falls 8 before surgery- "I don't  know what happened"; (+) FOF especially before surgery but not to the point she is scared of leaving the home   LIVING ENVIRONMENT: Lives with: lives alone and family available to check on her frequently Lives in: Other townhouse  Stairs: 1 STE no rails, no steps inside  Has following equipment at home: Single point cane and Walker - 4 wheeled  PLOF: Independent, Independent with basic ADLs, Independent with gait, and Independent with transfers  PATIENT GOALS: get back to myself and be confident in balance/mobility   OBJECTIVE:   GAIT: Gait pattern: decreased arm swing- Right, decreased arm swing- Left, trendelenburg, and trunk flexed Distance walked: Clinic distances in and out of session.  Assistive device utilized: None Level of assistance: SBA Comments: No unsteadiness or LOB.    TODAY'S TREATMENT:                                                                                                                              DATE: 03/19/22   Pt performed sit to stand transfer with feet on Airex from standard chair without UE support - 5 reps - with CGA; reported mild discomfort in Rt knee with this transfer but wanted to complete the 5 reps    NMR: Standing Balance: Surface: Airex Position: feet shoulder width apart  Completed with: Eyes Closed - held 10 secs without head turns with CGA:  pt stood on  Airex with EO - performed horizontal head turns 5 reps and then vertical head turns 5 reps with CGA   Rockerboard inside // bars - 10 reps with EO with UE support on // bars; 10 reps with RUE support only on // bar  with CGA;  10 reps with EC with bil. UE support    PATIENT EDUCATION: Education details: Results of goals, POC going forwards and adding more visits and areas to work on in therapy. EC balance addition to HEP and where to purchase balance pad  Person educated: Patient Education method: Explanation, Demonstration, and Handouts Education comprehension: verbalized  understanding  HOME EXERCISE PROGRAM:  Access Code: [email or web address] URL: [email or web address] Date: 02/18/2022 Prepared by: Janann August  Exercises - Sit to Stand Without Arm Support  - 1 x daily - 7 x weekly - 2 sets - 10 reps - Side Stepping with Resistance at Thighs and Counter Support  - 1 x daily - 7 x weekly - 2 sets - 10 reps - with use of green tband, verbal/demo cues for proper technique  - Backward Walking with Counter Support  - 1 x daily - 7 x weekly - 2 sets - 10 reps - Standing Tandem Balance with Counter Support  - 1 x daily - 7 x weekly - 2 sets - 2 reps - 30 seconds hold - Walking  - 1 x daily - 7 x weekly - 2 sets - 2 minutes hold - Tandem Walking with Counter Support  - 1 x daily - 5 x weekly - 3 sets - Romberg Stance on Foam Pad with Head Rotation  - 1 x daily - 5 x weekly - 1-2 sets - 10 reps - performed with 10 reps head turns, 10 reps head nods   New addition on 03/11/22:   - Romberg Stance Eyes Closed on Foam Pad  - 1 x daily - 5 x weekly - 3 sets - 30 hold  GOALS: Goals reviewed with patient? Yes  SHORT TERM GOALS: Target date: 02/21/2022  Will be compliant with appropriate progressive HEP  Baseline: Goal status: IN PROGRESS 11/27- still needs update, just starting to get back to HEP, unclear on when she started it again   2.  Will be able to name 3 ways to reduce fall risk at home and in the community  Baseline:  Goal status: IN PROGRESS 11/27 able to name 1/3   3.  Will score at least 17 on DGI to show improved functional balance skills  Baseline:  Goal status: IN PROGRESS 11/27- DNT    LONG TERM GOALS: Target date: 03/14/2022  Will be able to perform safe floor to stand transfers with no more than SBA  Baseline:  Goal status: MET  2.  Will be able to walk outside over grass and gravel with LRAD and no more than SBA  Baseline:  Goal status: ON-GOING  3.  Will score at least 20 on DGI to show improved functional balance  Baseline:  20/24 on 03/11/22  Goal status: MET  4.  Will be compliant with appropriate gym based exercise program vs advanced HEP to maintain functional gains  Baseline: pt performing exercises sometimes  Goal status: PARTIALLY MET   UPDATED/ONGOING LTGS FOR RE-CERT: LONG TERM GOALS: Target date:  04/08/2022  1.  Will be able to walk outside over grass and gravel with LRAD and no more than SBA  Baseline:  Goal status: ON-GOING  2.  Pt will improve FGA to at least a  24/30 in order to demo decr fall risk. Baseline: 20/30 on 03/11/22 Goal status: NEW  3.  Pt will improve condition 4 of mCTSIB for at least 30 seconds in order to demo improved vestibular input for balance.  Baseline: 19 seconds Goal status: NEW  ASSESSMENT:  CLINICAL IMPRESSION:  PT session was limited by time constraint due to pt having neurology appt at 2:30 today with request to leave early at 2:25 to arrive to appt on time: session focused on balance/vestibular exercises to increase vestibular input in maintaining balance.  Pt reported no dizziness during PT session, only mild unsteadiness with maintaining balance with head turns.  Cont with POC.     OBJECTIVE IMPAIRMENTS: Abnormal gait, decreased activity tolerance, decreased balance, decreased coordination, decreased knowledge of use of DME, and decreased mobility.   ACTIVITY LIMITATIONS: squatting, stairs, transfers, and locomotion level  PARTICIPATION LIMITATIONS: cleaning, laundry, driving, shopping, community activity, and yard work  PERSONAL FACTORS: Age, Behavior pattern, Fitness, Past/current experiences, and Time since onset of injury/illness/exacerbation are also affecting patient's functional outcome.   REHAB POTENTIAL: Good  CLINICAL DECISION MAKING: Evolving/moderate complexity  EVALUATION COMPLEXITY: Moderate  PLAN:  PT FREQUENCY: 1x/week  PT DURATION: 6 weeks  PLANNED INTERVENTIONS: Therapeutic exercises, Therapeutic activity, Neuromuscular  re-education, Balance training, Gait training, Patient/Family education, Self Care, Joint mobilization, Stair training, Vestibular training, DME instructions, Taping, Biofeedback, Manual therapy, and Re-evaluation  PLAN FOR NEXT SESSION: Work on balance - EC, narrow BOS, tandem gait, head motions, obstacle negotiation. Functional strength.     Vinnie Level RTMYTR, PT 03/19/22 4:22 PM

## 2022-03-19 NOTE — Patient Instructions (Signed)
Continue with physical therapy  Use a eye patch for the left eye  Follow up with neurosurgery as scheduled  Return in a year of sooner if worse

## 2022-03-19 NOTE — Progress Notes (Signed)
[facility]  PATIENT: Michelle Rubio DOB: [birth year]  REQUESTING CLINICIAN: Lucianne Lei, MD HISTORY FROM: Patient  REASON FOR VISIT: Dizziness and multiple falls    HISTORICAL  CHIEF COMPLAINT:  Chief Complaint  Patient presents with   Follow-up    Rm 14. Alone. Pt had a craniotomy in October. No longer complains of dizziness. Pt continues physical therapy.    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  She did follow-up with Dr. Venetia Constable and had the vestibular schwannoma resected.  From that resection she developed residual left facial weakness upper and lower face.  She reports that overall, her gait has markedly improved, she is not dizzy as prior to the surgery.  She is still getting physical therapy with great improvement.  Overall she is satisfied with her current progress.   HISTORY OF PRESENT ILLNESS:  This is a 71 year old woman past medical history of kidney disease, hypertension, hyperlipidemia, diabetes, hypothyroidism, who is presenting with dizziness since early summer.  Patient reports on July 28 she had her first fall due to the dizziness.  She described the dizziness as room spinning sensation lasting a few minutes.  There are no nausea or vomiting associated with dizziness.  When she has these episodes she is unable to walk which has caused her multiple falls.  She reported a total of 6 falls since July and the last 1 was a couple weeks ago.     OTHER MEDICAL CONDITIONS: Kidney disease, hypertension, hyperlipidemia, DMII, Hypothyroidism    REVIEW OF SYSTEMS: Full 14 system review of systems performed and negative with exception of: as noted in the HPI   ALLERGIES: No Known Allergies  HOME MEDICATIONS: Outpatient Medications Prior to Visit  Medication  Sig Dispense Refill   acetaminophen (TYLENOL) 325 MG tablet Take 1-2 tablets (325-650 mg total) by mouth every 4 (four) hours as needed for mild pain.     artificial tears (LACRILUBE) OINT ophthalmic ointment Place into the left eye at bedtime. 1 g 1   aspirin 81 MG tablet Take 81 mg by mouth daily.     Bempedoic Acid (NEXLETOL) 180 MG TABS Take 180 mg by mouth daily.     brimonidine (ALPHAGAN) 0.2 % ophthalmic solution Place 1 drop into both eyes 2 (two) times daily.     diclofenac Sodium (VOLTAREN) 1 % GEL Apply 2 g topically daily as needed (knee pain).     ezetimibe (ZETIA) 10 MG tablet Take 10 mg by mouth daily.     famotidine (PEPCID) 10 MG tablet Take 1 tablet (10 mg total) by mouth daily as needed for heartburn or indigestion.     levothyroxine (SYNTHROID) 75 MCG tablet Take 75 mcg by mouth daily.     metoprolol tartrate (LOPRESSOR) 25 MG tablet Take 12.5 mg by mouth 2 (two) times daily.     mycophenolate (MYFORTIC) 180 MG EC tablet Take 360 mg by mouth 2 (two) times daily.      Omega-3 Fatty Acids (FISH OIL) 1200 MG CAPS Take 2,400 mg by mouth daily.     polyvinyl alcohol (LIQUIFILM TEARS) 1.4 % ophthalmic solution Place 1 drop into the left eye every 4 (four) hours while awake. 15 mL 0   ROCKLATAN 0.02-0.005 % SOLN Place 1 drop into both eyes at bedtime.     sitaGLIPtin (JANUVIA)  25 MG tablet Take 25 mg by mouth daily.     sulfamethoxazole-trimethoprim (BACTRIM,SEPTRA) 400-80 MG per tablet Take 1 tablet by mouth every Monday, Wednesday, and Friday.     tacrolimus (PROGRAF) 1 MG capsule Take 2 mg by mouth 2 (two) times daily.     trifluridine (VIROPTIC) 1 % ophthalmic solution Place 1 drop into the left eye every 2 (two) hours while awake.     No facility-administered medications prior to visit.    PAST MEDICAL HISTORY: Past Medical History:  Diagnosis Date   Acute renal failure on dialysis ([place])    Body mass index (BMI) 20.0-20.9, adult    Chronic kidney disease    kidney  failure due to HTN- kidney transplant  2012   Diabetes mellitus without complication ([place])    type 2   Glaucoma    Gout    H/O Graves' disease 1980s   Hyperlipidemia    Hypertension    Hypothyroidism    Mitral valve prolapse     PAST SURGICAL HISTORY: Past Surgical History:  Procedure Laterality Date   CRANIOTOMY Left 01/21/2022   Procedure: Left Craniotomy for Tumor Resection;  Surgeon: Judith Part, MD;  Location: [place];  Service: Neurosurgery;  Laterality: Left;   KIDNEY TRANSPLANT  2012   THYROIDECTOMY, PARTIAL  1985   TONSILLECTOMY     TUBAL LIGATION      FAMILY HISTORY: Family History  Problem Relation Age of Onset   Coronary artery disease Mother    Heart disease Father        CHF    SOCIAL HISTORY: Social History   Socioeconomic History   Marital status: Widowed    Spouse name: Not on file   Number of children: 2   Years of education: Not on file   Highest education level: Not on file  Occupational History   Not on file  Tobacco Use   Smoking status: Never   Smokeless tobacco: Not on file  Vaping Use   Vaping Use: Never used  Substance and Sexual Activity   Alcohol use: No   Drug use: No   Sexual activity: Not Currently  Other Topics Concern   Not on file  Social History Narrative   Not on file   Social Determinants of Health   Financial Resource Strain: Not on file  Food Insecurity: Not on file  Transportation Needs: Not on file  Physical Activity: Not on file  Stress: Not on file  Social Connections: Not on file  Intimate Partner Violence: Not on file    PHYSICAL EXAM  GENERAL EXAM/CONSTITUTIONAL: Vitals:  Vitals:   03/19/22 1438  BP: (!) 162/83  Pulse: 71  Weight: 139 lb (63 kg)  Height: '5\' 8"'$  (1.727 m)   Body mass index is 21.13 kg/m. Wt Readings from Last 3 Encounters:  03/19/22 139 lb (63 kg)  01/23/22 137 lb 12.6 oz (62.5 kg)  01/21/22 147 lb (66.7 kg)   Patient is in no distress; well developed, nourished and  groomed; neck is supple  EYES: Visual fields full to confrontation, Extraocular movements intacts, she has sustained nystagmus on horizontal gazes bilaterally   MUSCULOSKELETAL: Gait, strength, tone, movements noted in Neurologic exam below  NEUROLOGIC: MENTAL STATUS:      No data to display         awake, alert, oriented to person, place and time recent and remote memory intact normal attention and concentration language fluent, comprehension intact, naming intact fund of knowledge appropriate  CRANIAL NERVE:  2nd, 3rd, 4th, 6th -visual fields full to confrontation, extraocular muscles intact, there is sustained nystagmus on horizontal gazes.  5th - facial sensation symmetric 7th - She has left upper and lower facial weakness  8th - hearing intact, ear canal cleared of wax.  9th - palate elevates symmetrically, uvula midline 11th - shoulder shrug symmetric 12th - tongue protrusion midline  MOTOR:  normal bulk and tone, full strength in the BUE, BLE  SENSORY:  normal and symmetric to light touch  COORDINATION:  finger-nose-finger, fine finger movements normal, no ataxia   GAIT/STATION:  Improved gait, able to walk independently. Difficulty with tandem gait. (Previous exam: Very unsteady, narrow based, unable to walk without assistance. Unable to tandem)   DIAGNOSTIC DATA (LABS, IMAGING, TESTING) - I reviewed patient records, labs, notes, testing and imaging myself where available.  Lab Results  Component Value Date   WBC 4.8 01/28/2022   HGB 9.2 (L) 01/28/2022   HCT 29.1 (L) 01/28/2022   MCV 87.7 01/28/2022   PLT 293 01/28/2022      Component Value Date/Time   NA 135 01/27/2022 0835   K 4.0 01/27/2022 0835   CL 98 01/27/2022 0835   CO2 24 01/27/2022 0835   GLUCOSE 160 (H) 01/27/2022 0835   BUN 36 (H) 01/27/2022 0835   CREATININE 2.23 (H) 01/27/2022 0835   CALCIUM 8.4 (L) 01/27/2022 0835   PROT 7.2 01/24/2022 0627   ALBUMIN 2.9 (L) 01/24/2022 0627    AST 25 01/24/2022 0627   ALT 10 01/24/2022 0627   ALKPHOS 48 01/24/2022 0627   BILITOT 0.4 01/24/2022 0627   GFRNONAA 23 (L) 01/27/2022 0835   GFRAA 59 (L) 12/06/2011 1014   No results found for: "CHOL", "HDL", "LDLCALC", "LDLDIRECT", "TRIG", "CHOLHDL" Lab Results  Component Value Date   HGBA1C 6.8 (H) 01/18/2022   No results found for: "VITAMINB12" No results found for: "TSH"   MRI Brain 12/24/2021 Large left cerebellar hemisphere mass consisting of a cyst with a large mural nodule with signal characteristics and size as above.  The cyst exerts mass effect on the brainstem with shifting towards the right.  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  Other tumors in adults with cysts and mural nodules that could have similar characteristics include pilocytic astrocytoma, pleomorphic xanthoastrocytoma but these are less likely in this patient's age and at this location Scattered T2/FLAIR hyperintense foci consistent with mild chronic medical ischemic change.   Mild generalized cortical atrophy, normal for age   MRI Brain 01/22/2022 1. Marked decrease in size of left cerebellopontine angle mass, now 2. 8 x 2.1 x 2.9 cm, previously 3.5 x 2.8 x 3.5 cm. 2. Improved mass effect on the brainstem and cerebral aqueduct. 3. Small amount of acute ischemia along the left posterior resection site.   ASSESSMENT AND PLAN  71 y.o. year old female with history of chronic kidney disease, hypertension, hyperlipidemia, diabetes mellitus, hypothyroidism, who is presenting with ongoing dizziness for the past 4 to 5 months which resulted in multiple falls found to have a large left vestibular schwannoma. She is status post resection and she is doing well. Her gait has improved but she is getting physical therapy. She however has residual left facial weakness.    1. 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[place])   2. Abnormal gait   3. Facial nerve palsy      Patient Instructions  Continue with physical  therapy  Use a eye patch for the left eye  Follow up with neurosurgery as scheduled  Return in a year of sooner if worse   No orders of the defined types were placed in this encounter.   No orders of the defined types were placed in this encounter.   Return in about 1 year (around 03/20/2023).  I have spent a total of 30 minutes dedicated to this patient today, preparing to see patient, performing a medically appropriate examination and evaluation, ordering tests and/or medications and procedures, and counseling and educating the patient/family/caregiver; independently interpreting result and communicating results to the family/patient/caregiver; and documenting clinical information in the electronic medical record.   Alric Ran, MD 03/19/2022, 5:00 PM  [facility] [address] [place], [place] [address] [phone]

## 2022-04-02 ENCOUNTER — Ambulatory Visit: Payer: PPO | Attending: Neurology | Admitting: Physical Therapy

## 2022-04-02 DIAGNOSIS — R2689 Other abnormalities of gait and mobility: Secondary | ICD-10-CM

## 2022-04-02 DIAGNOSIS — R2681 Unsteadiness on feet: Secondary | ICD-10-CM | POA: Diagnosis not present

## 2022-04-02 NOTE — Therapy (Unsigned)
OUTPATIENT PHYSICAL THERAPY NEURO TREATMENT   Patient Name: Michelle Rubio MRN: [id number] DOB:01-15-1951, 72 y.o., female Today's Date: 04/03/2022   PCP: Lucianne Lei  REFERRING PROVIDER: Barbie Banner, PA-C    PT End of Session - 04/03/22 1051     Visit Number 6    Number of Visits 9    Date for PT Re-Evaluation 05/10/22    Authorization Type [employer]    Progress Note Due on Visit 10    PT Start Time 1103    PT Stop Time 1140    PT Time Calculation (min) 37 min    Activity Tolerance Patient tolerated treatment well    Behavior During Therapy WFL for tasks assessed/performed                  Past Medical History:  Diagnosis Date   Acute renal failure on dialysis ([place])    Body mass index (BMI) 20.0-20.9, adult    Chronic kidney disease    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   Diabetes mellitus without complication ([place])    type 2   Glaucoma    Gout    H/O Graves' disease 1980s   Hyperlipidemia    Hypertension    Hypothyroidism    Mitral valve prolapse    Past Surgical History:  Procedure Laterality Date   CRANIOTOMY Left 01/21/2022   Procedure: Left Craniotomy for Tumor Resection;  Surgeon: Judith Part, MD;  Location: [facility];  Service: Neurosurgery;  Laterality: Left;   KIDNEY TRANSPLANT  2012   THYROIDECTOMY, PARTIAL  1985   TONSILLECTOMY     TUBAL LIGATION     Patient Active Problem List   Diagnosis Date Noted   Status post craniotomy 01/21/2022   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[place]) 01/21/2022   Preop cardiovascular exam 12/10/2011   Abnormal electrocardiogram 12/10/2011   Hypertension    Hypothyroidism    Chronic kidney disease    H/O Graves' disease     ONSET DATE: 01/29/2022   REFERRING DIAG: 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   THERAPY DIAG:  Unsteadiness on feet  Other abnormalities of gait and mobility  Rationale for Evaluation and Treatment: Rehabilitation  SUBJECTIVE:                                                                                                                                                                                              SUBJECTIVE STATEMENT:  Pt reports she is doing very well - did a lot of cooking and housecleaning in preparation for the holidays with family coming to visit -  did not have any problems with balance; feels she is ready for discharge and plans to go back to [facility] for classes  Pt accompanied by: self  PERTINENT HISTORY: Clinical Impression: Patient is a 72 year old female who presented with symptomatic left cystic vestibular schwannoma.  She underwent left craniectomy with resection of her tumor on 01/21/2022 by Dr. Venetia Constable.  Her preoperative symptoms included left hearing loss and ataxia.  On postop day 0, her examination revealed left facial droop with complete eye closure. She was observed overnight in the ICU. House-Brackmann score 4 on POD 1.  Follow-up MRI of the brain revealed expected subtotal, 70% resection by volume with lower cranial nerve function intact.  Eyedrops continue and left lid taped closed at nighttime.  Of cutaneous heparin started for DVT prophylaxis.  Home aspirin 81 mg on hold. The patient requires inpatient physical medicine and rehabilitation evaluations and treatment secondary to dysfunction due to left cystic vestibular schwannoma that is postresection.   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  Patient transferred to CIR on 01/23/2022 .   PAIN:  Are you having pain? Yes R knee pain 4/10   There were no vitals filed for this visit.     PRECAUTIONS: Fall and Other: impaired vision, recent hx craniectomy   WEIGHT BEARING RESTRICTIONS: No  FALLS: Has patient fallen in last 6 months? Yes. Number of falls 8 before surgery- "I don't know  what happened"; (+) FOF especially before surgery but not to the point she is scared of leaving the home   LIVING ENVIRONMENT: Lives with: lives alone and family available to check on her frequently Lives in: Other townhouse  Stairs: 1 STE no rails, no steps inside  Has following equipment at home: Single point cane and Walker - 4 wheeled  PLOF: Independent, Independent with basic ADLs, Independent with gait, and Independent with transfers  PATIENT GOALS: get back to myself and be confident in balance/mobility   OBJECTIVE:   GAIT: Gait pattern: decreased arm swing- Right, decreased arm swing- Left, trendelenburg, and trunk flexed Distance walked: Clinic distances in and out of session.  Assistive device utilized: None Level of assistance: SBA Comments: No unsteadiness or LOB.    TODAY'S TREATMENT:                                                                                                                              DATE: 04/02/22   [facility] PT Assessment - 04/03/22 0001       Functional Gait  Assessment   Gait assessed  Yes    Gait Level Surface Walks 20 ft in less than 5.5 sec, no assistive devices, good speed, no evidence for imbalance, normal gait pattern, deviates no more than 6 in outside of the 12 in walkway width.    Change in Gait Speed Able to smoothly change walking speed  without loss of balance or gait deviation. Deviate no more than 6 in outside of the 12 in walkway width.    Gait with Horizontal Head Turns Performs head turns smoothly with slight change in gait velocity (eg, minor disruption to smooth gait path), deviates 6-10 in outside 12 in walkway width, or uses an assistive device.    Gait with Vertical Head Turns Performs task with slight change in gait velocity (eg, minor disruption to smooth gait path), deviates 6 - 10 in outside 12 in walkway width or uses assistive device    Gait and Pivot Turn Pivot turns safely within 3 sec and stops quickly with no loss of  balance.    Step Over Obstacle Is able to step over 2 stacked shoe boxes taped together (9 in total height) without changing gait speed. No evidence of imbalance.    Gait with Narrow Base of Support Ambulates 4-7 steps.    Gait with Eyes Closed Walks 20 ft, uses assistive device, slower speed, mild gait deviations, deviates 6-10 in outside 12 in walkway width. Ambulates 20 ft in less than 9 sec but greater than 7 sec.    Ambulating Backwards Walks 20 ft, no assistive devices, good speed, no evidence for imbalance, normal gait    Steps Alternating feet, must use rail.    Total Score 24           NMR: TUG score:  9.32 secs without device  Pt performed standing on foam for MCTSIB testing: (LTG assessment) Condition 3 - 30 secs Condition 4 - trial 1 19.6 secs;  trial 2 - 32 secs    04/03/22 0001  Functional Gait  Assessment  Gait assessed  Yes  Gait Level Surface 3  Change in Gait Speed 3  Gait with Horizontal Head Turns 2  Gait with Vertical Head Turns 2  Gait and Pivot Turn 3  Step Over Obstacle 3  Gait with Narrow Base of Support 1  Gait with Eyes Closed 2  Ambulating Backwards 3  Steps 2  Total Score 24      PATIENT EDUCATION: Education details:  Progress towards goals - pt has met 3/3 updated LTG's - feels ready for discharge at this time: discussed balance HEP - pt reports no questions or problems with HEP at this time Person educated: Patient Education method: Explanation, Demonstration, and Handouts Education comprehension: verbalized understanding  HOME EXERCISE PROGRAM:  Access Code: [email or web address] URL: [email or web address] Date: 02/18/2022 Prepared by: Janann August  Exercises - Sit to Stand Without Arm Support  - 1 x daily - 7 x weekly - 2 sets - 10 reps - Side Stepping with Resistance at Thighs and Counter Support  - 1 x daily - 7 x weekly - 2 sets - 10 reps - with use of green tband, verbal/demo cues for proper technique  - Backward Walking  with Counter Support  - 1 x daily - 7 x weekly - 2 sets - 10 reps - Standing Tandem Balance with Counter Support  - 1 x daily - 7 x weekly - 2 sets - 2 reps - 30 seconds hold - Walking  - 1 x daily - 7 x weekly - 2 sets - 2 minutes hold - Tandem Walking with Counter Support  - 1 x daily - 5 x weekly - 3 sets - Romberg Stance on Foam Pad with Head Rotation  - 1 x daily - 5 x weekly - 1-2 sets - 10 reps - performed with 10 reps  head turns, 10 reps head nods   New addition on 03/11/22:   - Romberg Stance Eyes Closed on Foam Pad  - 1 x daily - 5 x weekly - 3 sets - 30 hold  GOALS: Goals reviewed with patient? Yes  SHORT TERM GOALS: Target date: 02/21/2022  Will be compliant with appropriate progressive HEP  Baseline: Goal status: IN PROGRESS 11/27- still needs update, just starting to get back to HEP, unclear on when she started it again   2.  Will be able to name 3 ways to reduce fall risk at home and in the community  Baseline:  Goal status: IN PROGRESS 11/27 able to name 1/3   3.  Will score at least 17 on DGI to show improved functional balance skills  Baseline:  Goal status: IN PROGRESS 11/27- DNT    LONG TERM GOALS: Target date: 03/14/2022  Will be able to perform safe floor to stand transfers with no more than SBA  Baseline:  Goal status: MET  2.  Will be able to walk outside over grass and gravel with LRAD and no more than SBA  Baseline:  Goal status: ON-GOING  3.  Will score at least 20 on DGI to show improved functional balance  Baseline: 20/24 on 03/11/22  Goal status: MET  4.  Will be compliant with appropriate gym based exercise program vs advanced HEP to maintain functional gains  Baseline: pt performing exercises sometimes  Goal status: PARTIALLY MET   UPDATED/ONGOING LTGS FOR RE-CERT: LONG TERM GOALS: Target date:  04/08/2022  1.  Will be able to walk outside over grass and gravel with LRAD and no more than SBA  Baseline:  Goal status: Pt reports she is  starting to walk outside more - does not use assistive device;  GOAL MET   2.  Pt will improve FGA to at least a 24/30 in order to demo decr fall risk. Baseline: 20/30 on 03/11/22;   24/30 on 04-02-22 Goal status:  Goal met 04-02-22  3.  Pt will improve condition 4 of mCTSIB for at least 30 seconds in order to demo improved vestibular input for balance.  Baseline: 19 seconds; 30 secs on 2nd trial condition 4 -- 04-02-22 Goal status: Goal met 04-02-22  ASSESSMENT:  CLINICAL IMPRESSION:  PT session focused on LTG assessment for discharge.  Pt has met LTG's #1-3 (all updated LTG's met);  pt's FGA score has increased from 20/30 to 24/30.  Pt reports she is pleased with her current functional status and feels ready for discharge.  Pt reports she plans to return to [facility] for exercise classes.      OBJECTIVE IMPAIRMENTS: Abnormal gait, decreased activity tolerance, decreased balance, decreased coordination, decreased knowledge of use of DME, and decreased mobility.   ACTIVITY LIMITATIONS: squatting, stairs, transfers, and locomotion level  PARTICIPATION LIMITATIONS: cleaning, laundry, driving, shopping, community activity, and yard work  PERSONAL FACTORS: Age, Behavior pattern, Fitness, Past/current experiences, and Time since onset of injury/illness/exacerbation are also affecting patient's functional outcome.   REHAB POTENTIAL: Good  CLINICAL DECISION MAKING: Evolving/moderate complexity  EVALUATION COMPLEXITY: Moderate  PLAN:  PT FREQUENCY: 1x/week  PT DURATION: 6 weeks  PLANNED INTERVENTIONS: Therapeutic exercises, Therapeutic activity, Neuromuscular re-education, Balance training, Gait training, Patient/Family education, Self Care, Joint mobilization, Stair training, Vestibular training, DME instructions, Taping, Biofeedback, Manual therapy, and Re-evaluation  PLAN FOR NEXT SESSION: N/A - D/C 04-02-22   PHYSICAL THERAPY DISCHARGE SUMMARY  Visits from Start of Care:  6  Current functional level related to goals / functional outcomes: See above for status towards goals - all goals met   Remaining deficits: Continued decreased high level balance skills   Education / Equipment: Pt has been instructed in HEP for balance/vestibular exercises   Patient agrees to discharge. Patient goals were met. Patient is being discharged due to meeting the stated rehab goals. Pt states she is pleased with her current functional status and feels ready for discharge at this time.    Vinnie Level [id number], PT 04/03/22 11:04 AM

## 2022-04-03 ENCOUNTER — Encounter: Payer: Self-pay | Admitting: Physical Therapy

## 2022-04-03 NOTE — Progress Notes (Signed)
   04/03/22 0001  Functional Gait  Assessment  Gait assessed  Yes  Gait Level Surface 3  Change in Gait Speed 3  Gait with Horizontal Head Turns 2  Gait with Vertical Head Turns 2  Gait and Pivot Turn 3  Step Over Obstacle 3  Gait with Narrow Base of Support 1  Gait with Eyes Closed 2  Ambulating Backwards 3  Steps 2  Total Score 24

## 2022-04-09 ENCOUNTER — Ambulatory Visit: Payer: PPO | Admitting: Physical Therapy

## 2022-04-09 DIAGNOSIS — L821 Other seborrheic keratosis: Secondary | ICD-10-CM | POA: Diagnosis not present

## 2022-04-09 DIAGNOSIS — L853 Xerosis cutis: Secondary | ICD-10-CM | POA: Diagnosis not present

## 2022-04-09 DIAGNOSIS — L84 Corns and callosities: Secondary | ICD-10-CM | POA: Diagnosis not present

## 2022-04-09 DIAGNOSIS — L814 Other melanin hyperpigmentation: Secondary | ICD-10-CM | POA: Diagnosis not present

## 2022-04-09 DIAGNOSIS — D225 Melanocytic nevi of trunk: Secondary | ICD-10-CM | POA: Diagnosis not present

## 2022-04-15 DIAGNOSIS — H02204 Unspecified lagophthalmos left upper eyelid: Secondary | ICD-10-CM | POA: Diagnosis not present

## 2022-04-16 ENCOUNTER — Ambulatory Visit: Payer: PPO | Admitting: Physical Therapy

## 2022-05-06 DIAGNOSIS — H401122 Primary open-angle glaucoma, left eye, moderate stage: Secondary | ICD-10-CM | POA: Diagnosis not present

## 2022-05-06 DIAGNOSIS — H401113 Primary open-angle glaucoma, right eye, severe stage: Secondary | ICD-10-CM | POA: Diagnosis not present

## 2022-05-09 DIAGNOSIS — R29810 Facial weakness: Secondary | ICD-10-CM | POA: Diagnosis not present

## 2022-05-09 DIAGNOSIS — I10 Essential (primary) hypertension: Secondary | ICD-10-CM | POA: Diagnosis not present

## 2022-05-09 DIAGNOSIS — E1122 Type 2 diabetes mellitus with diabetic chronic kidney disease: Secondary | ICD-10-CM | POA: Diagnosis not present

## 2022-05-09 DIAGNOSIS — N1832 Chronic kidney disease, stage 3b: Secondary | ICD-10-CM | POA: Diagnosis not present

## 2022-05-09 DIAGNOSIS — M25561 Pain in right knee: Secondary | ICD-10-CM | POA: Diagnosis not present

## 2022-05-09 DIAGNOSIS — M25461 Effusion, right knee: Secondary | ICD-10-CM | POA: Diagnosis not present

## 2022-05-09 DIAGNOSIS — M1711 Unilateral primary osteoarthritis, right knee: Secondary | ICD-10-CM | POA: Diagnosis not present

## 2022-05-21 DIAGNOSIS — H401122 Primary open-angle glaucoma, left eye, moderate stage: Secondary | ICD-10-CM | POA: Diagnosis not present

## 2022-05-21 DIAGNOSIS — H401113 Primary open-angle glaucoma, right eye, severe stage: Secondary | ICD-10-CM | POA: Diagnosis not present

## 2022-06-04 DIAGNOSIS — H401122 Primary open-angle glaucoma, left eye, moderate stage: Secondary | ICD-10-CM | POA: Diagnosis not present

## 2022-06-04 DIAGNOSIS — H401113 Primary open-angle glaucoma, right eye, severe stage: Secondary | ICD-10-CM | POA: Diagnosis not present

## 2022-06-07 DIAGNOSIS — Z940 Kidney transplant status: Secondary | ICD-10-CM | POA: Diagnosis not present

## 2022-06-14 DIAGNOSIS — Z940 Kidney transplant status: Secondary | ICD-10-CM | POA: Diagnosis not present

## 2022-06-25 DIAGNOSIS — E441 Mild protein-calorie malnutrition: Secondary | ICD-10-CM | POA: Diagnosis not present

## 2022-06-25 DIAGNOSIS — Z940 Kidney transplant status: Secondary | ICD-10-CM | POA: Diagnosis not present

## 2022-06-25 DIAGNOSIS — Z7984 Long term (current) use of oral hypoglycemic drugs: Secondary | ICD-10-CM | POA: Diagnosis not present

## 2022-06-25 DIAGNOSIS — E119 Type 2 diabetes mellitus without complications: Secondary | ICD-10-CM | POA: Diagnosis not present

## 2022-06-25 DIAGNOSIS — Z681 Body mass index (BMI) 19 or less, adult: Secondary | ICD-10-CM | POA: Diagnosis not present

## 2022-07-02 DIAGNOSIS — H1045 Other chronic allergic conjunctivitis: Secondary | ICD-10-CM | POA: Diagnosis not present

## 2022-07-02 DIAGNOSIS — Z940 Kidney transplant status: Secondary | ICD-10-CM | POA: Diagnosis not present

## 2022-07-12 DIAGNOSIS — N1832 Chronic kidney disease, stage 3b: Secondary | ICD-10-CM | POA: Diagnosis not present

## 2022-07-12 DIAGNOSIS — E785 Hyperlipidemia, unspecified: Secondary | ICD-10-CM | POA: Diagnosis not present

## 2022-07-12 DIAGNOSIS — E038 Other specified hypothyroidism: Secondary | ICD-10-CM | POA: Diagnosis not present

## 2022-07-12 DIAGNOSIS — E1122 Type 2 diabetes mellitus with diabetic chronic kidney disease: Secondary | ICD-10-CM | POA: Diagnosis not present

## 2022-07-12 DIAGNOSIS — I10 Essential (primary) hypertension: Secondary | ICD-10-CM | POA: Diagnosis not present

## 2022-07-15 DIAGNOSIS — E785 Hyperlipidemia, unspecified: Secondary | ICD-10-CM | POA: Diagnosis not present

## 2022-07-15 DIAGNOSIS — E038 Other specified hypothyroidism: Secondary | ICD-10-CM | POA: Diagnosis not present

## 2022-07-15 DIAGNOSIS — I10 Essential (primary) hypertension: Secondary | ICD-10-CM | POA: Diagnosis not present

## 2022-07-15 DIAGNOSIS — I129 Hypertensive chronic kidney disease with stage 1 through stage 4 chronic kidney disease, or unspecified chronic kidney disease: Secondary | ICD-10-CM | POA: Diagnosis not present

## 2022-07-15 DIAGNOSIS — N1832 Chronic kidney disease, stage 3b: Secondary | ICD-10-CM | POA: Diagnosis not present

## 2022-08-06 DIAGNOSIS — H401113 Primary open-angle glaucoma, right eye, severe stage: Secondary | ICD-10-CM | POA: Diagnosis not present

## 2022-08-06 DIAGNOSIS — H401122 Primary open-angle glaucoma, left eye, moderate stage: Secondary | ICD-10-CM | POA: Diagnosis not present

## 2022-08-13 DIAGNOSIS — H401132 Primary open-angle glaucoma, bilateral, moderate stage: Secondary | ICD-10-CM | POA: Diagnosis not present

## 2022-08-13 DIAGNOSIS — E119 Type 2 diabetes mellitus without complications: Secondary | ICD-10-CM | POA: Diagnosis not present

## 2022-08-13 DIAGNOSIS — H4311 Vitreous hemorrhage, right eye: Secondary | ICD-10-CM | POA: Diagnosis not present

## 2022-08-13 DIAGNOSIS — H43813 Vitreous degeneration, bilateral: Secondary | ICD-10-CM | POA: Diagnosis not present

## 2022-08-13 DIAGNOSIS — H34811 Central retinal vein occlusion, right eye, with macular edema: Secondary | ICD-10-CM | POA: Diagnosis not present

## 2022-08-13 DIAGNOSIS — H2513 Age-related nuclear cataract, bilateral: Secondary | ICD-10-CM | POA: Diagnosis not present

## 2022-08-13 DIAGNOSIS — H35033 Hypertensive retinopathy, bilateral: Secondary | ICD-10-CM | POA: Diagnosis not present

## 2022-08-23 DIAGNOSIS — E785 Hyperlipidemia, unspecified: Secondary | ICD-10-CM | POA: Diagnosis not present

## 2022-08-23 DIAGNOSIS — E1169 Type 2 diabetes mellitus with other specified complication: Secondary | ICD-10-CM | POA: Diagnosis not present

## 2022-08-23 DIAGNOSIS — I10 Essential (primary) hypertension: Secondary | ICD-10-CM | POA: Diagnosis not present

## 2022-08-23 DIAGNOSIS — E038 Other specified hypothyroidism: Secondary | ICD-10-CM | POA: Diagnosis not present

## 2022-08-23 DIAGNOSIS — N1832 Chronic kidney disease, stage 3b: Secondary | ICD-10-CM | POA: Diagnosis not present

## 2022-08-23 DIAGNOSIS — I129 Hypertensive chronic kidney disease with stage 1 through stage 4 chronic kidney disease, or unspecified chronic kidney disease: Secondary | ICD-10-CM | POA: Diagnosis not present

## 2022-08-27 DIAGNOSIS — I10 Essential (primary) hypertension: Secondary | ICD-10-CM | POA: Diagnosis not present

## 2022-08-27 DIAGNOSIS — H3530 Unspecified macular degeneration: Secondary | ICD-10-CM | POA: Diagnosis not present

## 2022-08-27 DIAGNOSIS — N1832 Chronic kidney disease, stage 3b: Secondary | ICD-10-CM | POA: Diagnosis not present

## 2022-08-27 DIAGNOSIS — E785 Hyperlipidemia, unspecified: Secondary | ICD-10-CM | POA: Diagnosis not present

## 2022-08-27 DIAGNOSIS — E1122 Type 2 diabetes mellitus with diabetic chronic kidney disease: Secondary | ICD-10-CM | POA: Diagnosis not present

## 2022-08-27 DIAGNOSIS — M25561 Pain in right knee: Secondary | ICD-10-CM | POA: Diagnosis not present

## 2022-08-27 DIAGNOSIS — I129 Hypertensive chronic kidney disease with stage 1 through stage 4 chronic kidney disease, or unspecified chronic kidney disease: Secondary | ICD-10-CM | POA: Diagnosis not present

## 2022-08-27 DIAGNOSIS — T8610 Unspecified complication of kidney transplant: Secondary | ICD-10-CM | POA: Diagnosis not present

## 2022-08-28 DIAGNOSIS — M170 Bilateral primary osteoarthritis of knee: Secondary | ICD-10-CM | POA: Diagnosis not present

## 2022-09-09 DIAGNOSIS — H2513 Age-related nuclear cataract, bilateral: Secondary | ICD-10-CM | POA: Diagnosis not present

## 2022-09-09 DIAGNOSIS — H35033 Hypertensive retinopathy, bilateral: Secondary | ICD-10-CM | POA: Diagnosis not present

## 2022-09-09 DIAGNOSIS — E119 Type 2 diabetes mellitus without complications: Secondary | ICD-10-CM | POA: Diagnosis not present

## 2022-09-09 DIAGNOSIS — H4311 Vitreous hemorrhage, right eye: Secondary | ICD-10-CM | POA: Diagnosis not present

## 2022-09-09 DIAGNOSIS — H34811 Central retinal vein occlusion, right eye, with macular edema: Secondary | ICD-10-CM | POA: Diagnosis not present

## 2022-09-09 DIAGNOSIS — H43813 Vitreous degeneration, bilateral: Secondary | ICD-10-CM | POA: Diagnosis not present

## 2022-09-09 DIAGNOSIS — H401132 Primary open-angle glaucoma, bilateral, moderate stage: Secondary | ICD-10-CM | POA: Diagnosis not present

## 2022-09-18 DIAGNOSIS — M170 Bilateral primary osteoarthritis of knee: Secondary | ICD-10-CM | POA: Diagnosis not present

## 2022-10-02 DIAGNOSIS — M25461 Effusion, right knee: Secondary | ICD-10-CM | POA: Diagnosis not present

## 2022-10-02 DIAGNOSIS — M1711 Unilateral primary osteoarthritis, right knee: Secondary | ICD-10-CM | POA: Diagnosis not present

## 2022-10-09 DIAGNOSIS — M1711 Unilateral primary osteoarthritis, right knee: Secondary | ICD-10-CM | POA: Diagnosis not present

## 2022-10-16 DIAGNOSIS — M1711 Unilateral primary osteoarthritis, right knee: Secondary | ICD-10-CM | POA: Diagnosis not present

## 2022-10-24 DIAGNOSIS — E441 Mild protein-calorie malnutrition: Secondary | ICD-10-CM | POA: Diagnosis not present

## 2022-10-24 DIAGNOSIS — Z681 Body mass index (BMI) 19 or less, adult: Secondary | ICD-10-CM | POA: Diagnosis not present

## 2022-10-24 DIAGNOSIS — Z940 Kidney transplant status: Secondary | ICD-10-CM | POA: Diagnosis not present

## 2022-10-24 DIAGNOSIS — Z7984 Long term (current) use of oral hypoglycemic drugs: Secondary | ICD-10-CM | POA: Diagnosis not present

## 2022-10-24 DIAGNOSIS — E1122 Type 2 diabetes mellitus with diabetic chronic kidney disease: Secondary | ICD-10-CM | POA: Diagnosis not present

## 2022-10-24 DIAGNOSIS — I120 Hypertensive chronic kidney disease with stage 5 chronic kidney disease or end stage renal disease: Secondary | ICD-10-CM | POA: Diagnosis not present

## 2022-10-24 DIAGNOSIS — N186 End stage renal disease: Secondary | ICD-10-CM | POA: Diagnosis not present

## 2022-10-28 DIAGNOSIS — E1169 Type 2 diabetes mellitus with other specified complication: Secondary | ICD-10-CM | POA: Diagnosis not present

## 2022-10-28 DIAGNOSIS — N1832 Chronic kidney disease, stage 3b: Secondary | ICD-10-CM | POA: Diagnosis not present

## 2022-10-28 DIAGNOSIS — E785 Hyperlipidemia, unspecified: Secondary | ICD-10-CM | POA: Diagnosis not present

## 2022-10-28 DIAGNOSIS — I10 Essential (primary) hypertension: Secondary | ICD-10-CM | POA: Diagnosis not present

## 2022-10-28 DIAGNOSIS — I129 Hypertensive chronic kidney disease with stage 1 through stage 4 chronic kidney disease, or unspecified chronic kidney disease: Secondary | ICD-10-CM | POA: Diagnosis not present

## 2022-10-28 DIAGNOSIS — E038 Other specified hypothyroidism: Secondary | ICD-10-CM | POA: Diagnosis not present

## 2022-10-29 DIAGNOSIS — R634 Abnormal weight loss: Secondary | ICD-10-CM | POA: Diagnosis not present

## 2022-10-29 DIAGNOSIS — N1832 Chronic kidney disease, stage 3b: Secondary | ICD-10-CM | POA: Diagnosis not present

## 2022-10-29 DIAGNOSIS — Z940 Kidney transplant status: Secondary | ICD-10-CM | POA: Diagnosis not present

## 2022-11-10 DIAGNOSIS — M79675 Pain in left toe(s): Secondary | ICD-10-CM | POA: Diagnosis not present

## 2022-11-10 DIAGNOSIS — Z940 Kidney transplant status: Secondary | ICD-10-CM | POA: Diagnosis not present

## 2022-11-12 DIAGNOSIS — Z681 Body mass index (BMI) 19 or less, adult: Secondary | ICD-10-CM | POA: Diagnosis not present

## 2022-11-12 DIAGNOSIS — I129 Hypertensive chronic kidney disease with stage 1 through stage 4 chronic kidney disease, or unspecified chronic kidney disease: Secondary | ICD-10-CM | POA: Diagnosis not present

## 2022-11-12 DIAGNOSIS — Z940 Kidney transplant status: Secondary | ICD-10-CM | POA: Diagnosis not present

## 2022-11-12 DIAGNOSIS — M10071 Idiopathic gout, right ankle and foot: Secondary | ICD-10-CM | POA: Diagnosis not present

## 2022-11-14 DIAGNOSIS — D849 Immunodeficiency, unspecified: Secondary | ICD-10-CM | POA: Diagnosis not present

## 2022-11-14 DIAGNOSIS — Z940 Kidney transplant status: Secondary | ICD-10-CM | POA: Diagnosis not present

## 2022-11-15 DIAGNOSIS — M79675 Pain in left toe(s): Secondary | ICD-10-CM | POA: Diagnosis not present

## 2022-11-19 DIAGNOSIS — Z940 Kidney transplant status: Secondary | ICD-10-CM | POA: Diagnosis not present

## 2022-11-19 DIAGNOSIS — Z7960 Long term (current) use of unspecified immunomodulators and immunosuppressants: Secondary | ICD-10-CM | POA: Diagnosis not present

## 2022-12-10 DIAGNOSIS — H43813 Vitreous degeneration, bilateral: Secondary | ICD-10-CM | POA: Diagnosis not present

## 2022-12-10 DIAGNOSIS — H401122 Primary open-angle glaucoma, left eye, moderate stage: Secondary | ICD-10-CM | POA: Diagnosis not present

## 2022-12-10 DIAGNOSIS — H35033 Hypertensive retinopathy, bilateral: Secondary | ICD-10-CM | POA: Diagnosis not present

## 2022-12-10 DIAGNOSIS — H4311 Vitreous hemorrhage, right eye: Secondary | ICD-10-CM | POA: Diagnosis not present

## 2022-12-10 DIAGNOSIS — H2513 Age-related nuclear cataract, bilateral: Secondary | ICD-10-CM | POA: Diagnosis not present

## 2022-12-10 DIAGNOSIS — H401113 Primary open-angle glaucoma, right eye, severe stage: Secondary | ICD-10-CM | POA: Diagnosis not present

## 2022-12-10 DIAGNOSIS — H401132 Primary open-angle glaucoma, bilateral, moderate stage: Secondary | ICD-10-CM | POA: Diagnosis not present

## 2022-12-10 DIAGNOSIS — E119 Type 2 diabetes mellitus without complications: Secondary | ICD-10-CM | POA: Diagnosis not present

## 2022-12-10 DIAGNOSIS — H34811 Central retinal vein occlusion, right eye, with macular edema: Secondary | ICD-10-CM | POA: Diagnosis not present

## 2022-12-17 DIAGNOSIS — H34811 Central retinal vein occlusion, right eye, with macular edema: Secondary | ICD-10-CM | POA: Diagnosis not present

## 2022-12-31 DIAGNOSIS — Z1231 Encounter for screening mammogram for malignant neoplasm of breast: Secondary | ICD-10-CM | POA: Diagnosis not present

## 2023-01-02 ENCOUNTER — Other Ambulatory Visit: Payer: Self-pay | Admitting: Obstetrics & Gynecology

## 2023-01-02 DIAGNOSIS — R928 Other abnormal and inconclusive findings on diagnostic imaging of breast: Secondary | ICD-10-CM

## 2023-01-07 DIAGNOSIS — H4311 Vitreous hemorrhage, right eye: Secondary | ICD-10-CM | POA: Diagnosis not present

## 2023-01-07 DIAGNOSIS — H401132 Primary open-angle glaucoma, bilateral, moderate stage: Secondary | ICD-10-CM | POA: Diagnosis not present

## 2023-01-07 DIAGNOSIS — E119 Type 2 diabetes mellitus without complications: Secondary | ICD-10-CM | POA: Diagnosis not present

## 2023-01-07 DIAGNOSIS — H35033 Hypertensive retinopathy, bilateral: Secondary | ICD-10-CM | POA: Diagnosis not present

## 2023-01-07 DIAGNOSIS — H2513 Age-related nuclear cataract, bilateral: Secondary | ICD-10-CM | POA: Diagnosis not present

## 2023-01-07 DIAGNOSIS — H34811 Central retinal vein occlusion, right eye, with macular edema: Secondary | ICD-10-CM | POA: Diagnosis not present

## 2023-01-07 DIAGNOSIS — H43813 Vitreous degeneration, bilateral: Secondary | ICD-10-CM | POA: Diagnosis not present

## 2023-01-14 ENCOUNTER — Ambulatory Visit: Payer: PPO

## 2023-01-14 ENCOUNTER — Ambulatory Visit
Admission: RE | Admit: 2023-01-14 | Discharge: 2023-01-14 | Disposition: A | Discharge status: Home / Self Care | Payer: HMO | Source: Ambulatory Visit | Attending: Obstetrics & Gynecology | Admitting: Obstetrics & Gynecology

## 2023-01-14 DIAGNOSIS — R928 Other abnormal and inconclusive findings on diagnostic imaging of breast: Secondary | ICD-10-CM | POA: Diagnosis not present

## 2023-02-07 DIAGNOSIS — H34811 Central retinal vein occlusion, right eye, with macular edema: Secondary | ICD-10-CM | POA: Diagnosis not present

## 2023-02-07 DIAGNOSIS — H43813 Vitreous degeneration, bilateral: Secondary | ICD-10-CM | POA: Diagnosis not present

## 2023-02-07 DIAGNOSIS — H401132 Primary open-angle glaucoma, bilateral, moderate stage: Secondary | ICD-10-CM | POA: Diagnosis not present

## 2023-02-07 DIAGNOSIS — H2513 Age-related nuclear cataract, bilateral: Secondary | ICD-10-CM | POA: Diagnosis not present

## 2023-02-07 DIAGNOSIS — H35033 Hypertensive retinopathy, bilateral: Secondary | ICD-10-CM | POA: Diagnosis not present

## 2023-02-07 DIAGNOSIS — E119 Type 2 diabetes mellitus without complications: Secondary | ICD-10-CM | POA: Diagnosis not present

## 2023-02-07 DIAGNOSIS — H4311 Vitreous hemorrhage, right eye: Secondary | ICD-10-CM | POA: Diagnosis not present

## 2023-03-07 DIAGNOSIS — H34811 Central retinal vein occlusion, right eye, with macular edema: Secondary | ICD-10-CM | POA: Diagnosis not present

## 2023-03-20 ENCOUNTER — Ambulatory Visit: Payer: HMO | Admitting: Neurology

## 2023-03-20 ENCOUNTER — Encounter: Payer: Self-pay | Admitting: Neurology

## 2023-03-20 VITALS — BP 134/68 | HR 57 | Resp 16 | Ht 68.0 in

## 2023-03-20 DIAGNOSIS — D333 Benign neoplasm of cranial nerves: Secondary | ICD-10-CM | POA: Diagnosis not present

## 2023-03-20 DIAGNOSIS — G510 Bell's palsy: Secondary | ICD-10-CM

## 2023-03-20 NOTE — Progress Notes (Signed)
[facility]  PATIENT: Michelle Rubio DOB: 1951/03/22  REQUESTING CLINICIAN: Renaye Rakers, MD HISTORY FROM: Patient  REASON FOR VISIT: Vestibular schwannoma follow up   HISTORICAL  CHIEF COMPLAINT:  Chief Complaint  Patient presents with   Follow-up    Rm12, alone, GAIT & FALLS :pt walked well into clinic and denied recent falls, no concerns    INTERVAL HISTORY 03/20/2023 Patient presents follow-up, last visit was a year ago.  Since then she has been doing well, denies any worsening dizziness, fall or balance.  She is satisfied with her progress.  Currently no complaint or concern other than weight loss.   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  She did follow-up with Dr. Johnsie Cancel and had the vestibular schwannoma resected.  From that resection she developed residual left facial weakness upper and lower face.  She reports that overall, her gait has markedly improved, she is not dizzy as prior to the surgery.  She is still getting physical therapy with great improvement.  Overall she is satisfied with her current progress.   HISTORY OF PRESENT ILLNESS:  This is a 72 year old woman past medical history of kidney disease, hypertension, hyperlipidemia, diabetes, hypothyroidism, who is presenting with dizziness since early summer.  Patient reports on July 28 she had her first fall due to the dizziness.  She described the dizziness as room spinning sensation lasting a few minutes.  There are no nausea or vomiting associated with dizziness.  When she has these episodes she is unable to walk which has caused her multiple falls.  She reported a total of 6 falls since July and the last 1 was a couple weeks ago.     OTHER MEDICAL CONDITIONS: Kidney disease, hypertension,  hyperlipidemia, DMII, Hypothyroidism    REVIEW OF SYSTEMS: Full 14 system review of systems performed and negative with exception of: as noted in the HPI   ALLERGIES: No Known Allergies  HOME MEDICATIONS: Outpatient Medications Prior to Visit  Medication Sig Dispense Refill   acetaminophen (TYLENOL) 325 MG tablet Take 1-2 tablets (325-650 mg total) by mouth every 4 (four) hours as needed for mild pain.     artificial tears (LACRILUBE) OINT ophthalmic ointment Place into the left eye at bedtime. 1 g 1   aspirin 81 MG tablet Take 81 mg by mouth daily.     Bempedoic Acid (NEXLETOL) 180 MG TABS Take 180 mg by mouth daily.     brimonidine (ALPHAGAN) 0.2 % ophthalmic solution Place 1 drop into both eyes 2 (two) times daily.     diclofenac Sodium (VOLTAREN) 1 % GEL Apply 2 g topically daily as needed (knee pain).     ezetimibe (ZETIA) 10 MG tablet Take 10 mg by mouth daily.     levothyroxine (SYNTHROID) 75 MCG tablet Take 75 mcg by mouth daily.     metoprolol tartrate (LOPRESSOR) 25 MG tablet Take 12.5 mg by mouth 2 (two) times daily.     mycophenolate (MYFORTIC) 180 MG EC tablet Take 360 mg by mouth 2 (two) times daily.      Omega-3 Fatty Acids (FISH OIL) 1200 MG CAPS Take 2,400 mg by mouth daily.     polyvinyl alcohol (LIQUIFILM TEARS) 1.4 % ophthalmic solution Place 1 drop into the left eye every 4 (four) hours while awake. 15 mL 0  ROCKLATAN 0.02-0.005 % SOLN Place 1 drop into both eyes at bedtime.     sitaGLIPtin (JANUVIA) 25 MG tablet Take 25 mg by mouth daily.     sulfamethoxazole-trimethoprim (BACTRIM,SEPTRA) 400-80 MG per tablet Take 1 tablet by mouth every Monday, Wednesday, and Friday.     tacrolimus (PROGRAF) 1 MG capsule Take 2 mg by mouth 2 (two) times daily.     famotidine (PEPCID) 10 MG tablet Take 1 tablet (10 mg total) by mouth daily as needed for heartburn or indigestion.     trifluridine (VIROPTIC) 1 % ophthalmic solution Place 1 drop into the left eye every 2 (two) hours  while awake.     No facility-administered medications prior to visit.    PAST MEDICAL HISTORY: Past Medical History:  Diagnosis Date   Acute renal failure on dialysis ([facility])    Body mass index (BMI) 20.0-20.9, adult    Chronic kidney disease    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   Diabetes mellitus without complication ([facility])    type 2   Glaucoma    Gout    H/O Graves' disease 1980s   Hyperlipidemia    Hypertension    Hypothyroidism    Mitral valve prolapse     PAST SURGICAL HISTORY: Past Surgical History:  Procedure Laterality Date   CRANIOTOMY Left 01/21/2022   Procedure: Left Craniotomy for Tumor Resection;  Surgeon: Jadene Pierini, MD;  Location: [facility];  Service: Neurosurgery;  Laterality: Left;   KIDNEY TRANSPLANT  2012   THYROIDECTOMY, PARTIAL  1985   TONSILLECTOMY     TUBAL LIGATION      FAMILY HISTORY: Family History  Problem Relation Age of Onset   Coronary artery disease Mother    Heart disease Father        CHF    SOCIAL HISTORY: Social History   Socioeconomic History   Marital status: Widowed    Spouse name: Not on file   Number of children: 2   Years of education: Not on file   Highest education level: Not on file  Occupational History   Not on file  Tobacco Use   Smoking status: Never   Smokeless tobacco: Not on file  Vaping Use   Vaping status: Never Used  Substance and Sexual Activity   Alcohol use: No   Drug use: No   Sexual activity: Not Currently  Other Topics Concern   Not on file  Social History Narrative   Not on file   Social Drivers of Health   Financial Resource Strain: Not on file  Food Insecurity: Not on file  Transportation Needs: Not on file  Physical Activity: Not on file  Stress: Not on file  Social Connections: Not on file  Intimate Partner Violence: Not on file    PHYSICAL EXAM  GENERAL EXAM/CONSTITUTIONAL: Vitals:  Vitals:   03/20/23 1114  BP: 134/68  Pulse: (!) 57  Resp: 16   Height: 5\' 8"  (1.727 m)   Body mass index is 21.13 kg/m. Wt Readings from Last 3 Encounters:  03/19/22 139 lb (63 kg)  01/23/22 137 lb 12.6 oz (62.5 kg)  01/21/22 147 lb (66.7 kg)   Patient is in no distress; well developed, nourished and groomed; neck is supple  EYES: Visual fields full to confrontation, Extraocular movements intacts, no nystagmus  MUSCULOSKELETAL: Gait, strength, tone, movements noted in Neurologic exam below  NEUROLOGIC: MENTAL STATUS:      No data to display  awake, alert, oriented to person, place and time recent and remote memory intact normal attention and concentration language fluent, comprehension intact, naming intact fund of knowledge appropriate  CRANIAL NERVE:  2nd, 3rd, 4th, 6th -visual fields full to confrontation, extraocular muscles intact, no nystagmus 5th - facial sensation symmetric 7th - She has left upper and lower facial weakness  8th - hearing intact, ear canal cleared of wax.  9th - palate elevates symmetrically, uvula midline 11th - shoulder shrug symmetric 12th - tongue protrusion midline  MOTOR:  normal bulk and tone, full strength in the BUE, BLE  SENSORY:  normal and symmetric to light touch  COORDINATION:  finger-nose-finger, fine finger movements normal, no ataxia  GAIT/STATION:  Improved gait, able to walk independently. Difficulty with tandem gait. (Previous exam prior to resection: Very unsteady, narrow based, unable to walk without assistance. Unable to tandem)   DIAGNOSTIC DATA (LABS, IMAGING, TESTING) - I reviewed patient records, labs, notes, testing and imaging myself where available.  Lab Results  Component Value Date   WBC 4.8 01/28/2022   HGB 9.2 (L) 01/28/2022   HCT 29.1 (L) 01/28/2022   MCV 87.7 01/28/2022   PLT 293 01/28/2022      Component Value Date/Time   NA 135 01/27/2022 0835   K 4.0 01/27/2022 0835   CL 98 01/27/2022 0835   CO2 24 01/27/2022 0835   GLUCOSE 160 (H)  01/27/2022 0835   BUN 36 (H) 01/27/2022 0835   CREATININE 2.23 (H) 01/27/2022 0835   CALCIUM 8.4 (L) 01/27/2022 0835   PROT 7.2 01/24/2022 0627   ALBUMIN 2.9 (L) 01/24/2022 0627   AST 25 01/24/2022 0627   ALT 10 01/24/2022 0627   ALKPHOS 48 01/24/2022 0627   BILITOT 0.4 01/24/2022 0627   GFRNONAA 23 (L) 01/27/2022 0835   GFRAA 59 (L) 12/06/2011 1014   No results found for: "CHOL", "HDL", "LDLCALC", "LDLDIRECT", "TRIG", "CHOLHDL" Lab Results  Component Value Date   HGBA1C 6.8 (H) 01/18/2022   No results found for: "VITAMINB12" No results found for: "TSH"   MRI Brain 12/24/2021 Large left cerebellar hemisphere mass consisting of a cyst with a large mural nodule with signal characteristics and size as above.  The cyst exerts mass effect on the brainstem with shifting towards the right.  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  Other tumors in adults with cysts and mural nodules that could have similar characteristics include pilocytic astrocytoma, pleomorphic xanthoastrocytoma but these are less likely in this patient's age and at this location Scattered T2/FLAIR hyperintense foci consistent with mild chronic medical ischemic change.   Mild generalized cortical atrophy, normal for age   MRI Brain 01/22/2022 1. Marked decrease in size of left cerebellopontine angle mass, now 2. 8 x 2.1 x 2.9 cm, previously 3.5 x 2.8 x 3.5 cm. 2. Improved mass effect on the brainstem and cerebral aqueduct. 3. Small amount of acute ischemia along the left posterior resection site.   ASSESSMENT AND PLAN  72 y.o. year old female with history of chronic kidney disease, hypertension, hyperlipidemia, diabetes mellitus, hypothyroidism, who is presenting for follow up for large left vestibular schwannoma status post resection.  She continues to do well, completed physical therapy, denies any worsening dizziness, or balance problem or falls.  However, she still has her residual left facial  weakness.  At this point, patient will continue to follow with PCP, and advised her to discuss ways to stimulate her appetite, possibly starting mirtazapine or protein shake  after each meal.  I will see her as needed.   1. 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[facility])   2. Facial nerve palsy     Patient Instructions  Continue current medications Continue to follow with PCP, can discuss with PCP ways to stimulate her appetite including starting protein shake after each meal or mirtazapine Follow-up as needed.  No orders of the defined types were placed in this encounter.   No orders of the defined types were placed in this encounter.   Return if symptoms worsen or fail to improve.    Windell Norfolk, MD 03/20/2023, 11:46 AM  [facility] [address], [address] [place], [place] [address] [phone]

## 2023-03-20 NOTE — Patient Instructions (Signed)
Continue current medications Continue to follow with PCP, can discuss with PCP ways to stimulate her appetite including starting protein shake after each meal or mirtazapine Follow-up as needed.

## 2023-04-04 DIAGNOSIS — H34811 Central retinal vein occlusion, right eye, with macular edema: Secondary | ICD-10-CM | POA: Diagnosis not present

## 2023-04-10 DIAGNOSIS — D225 Melanocytic nevi of trunk: Secondary | ICD-10-CM | POA: Diagnosis not present

## 2023-04-10 DIAGNOSIS — L853 Xerosis cutis: Secondary | ICD-10-CM | POA: Diagnosis not present

## 2023-04-10 DIAGNOSIS — L814 Other melanin hyperpigmentation: Secondary | ICD-10-CM | POA: Diagnosis not present

## 2023-04-10 DIAGNOSIS — L821 Other seborrheic keratosis: Secondary | ICD-10-CM | POA: Diagnosis not present

## 2023-05-02 DIAGNOSIS — E119 Type 2 diabetes mellitus without complications: Secondary | ICD-10-CM | POA: Diagnosis not present

## 2023-05-02 DIAGNOSIS — H401132 Primary open-angle glaucoma, bilateral, moderate stage: Secondary | ICD-10-CM | POA: Diagnosis not present

## 2023-05-02 DIAGNOSIS — H4311 Vitreous hemorrhage, right eye: Secondary | ICD-10-CM | POA: Diagnosis not present

## 2023-05-02 DIAGNOSIS — H35033 Hypertensive retinopathy, bilateral: Secondary | ICD-10-CM | POA: Diagnosis not present

## 2023-05-02 DIAGNOSIS — H34811 Central retinal vein occlusion, right eye, with macular edema: Secondary | ICD-10-CM | POA: Diagnosis not present

## 2023-05-02 DIAGNOSIS — H2513 Age-related nuclear cataract, bilateral: Secondary | ICD-10-CM | POA: Diagnosis not present

## 2023-05-02 DIAGNOSIS — H43813 Vitreous degeneration, bilateral: Secondary | ICD-10-CM | POA: Diagnosis not present

## 2023-05-05 DIAGNOSIS — H401113 Primary open-angle glaucoma, right eye, severe stage: Secondary | ICD-10-CM | POA: Diagnosis not present

## 2023-05-05 DIAGNOSIS — H401122 Primary open-angle glaucoma, left eye, moderate stage: Secondary | ICD-10-CM | POA: Diagnosis not present

## 2023-05-06 DIAGNOSIS — R634 Abnormal weight loss: Secondary | ICD-10-CM | POA: Diagnosis not present

## 2023-05-06 DIAGNOSIS — E038 Other specified hypothyroidism: Secondary | ICD-10-CM | POA: Diagnosis not present

## 2023-05-06 DIAGNOSIS — E1122 Type 2 diabetes mellitus with diabetic chronic kidney disease: Secondary | ICD-10-CM | POA: Diagnosis not present

## 2023-05-06 DIAGNOSIS — I129 Hypertensive chronic kidney disease with stage 1 through stage 4 chronic kidney disease, or unspecified chronic kidney disease: Secondary | ICD-10-CM | POA: Diagnosis not present

## 2023-05-06 DIAGNOSIS — M10071 Idiopathic gout, right ankle and foot: Secondary | ICD-10-CM | POA: Diagnosis not present

## 2023-05-06 DIAGNOSIS — E785 Hyperlipidemia, unspecified: Secondary | ICD-10-CM | POA: Diagnosis not present

## 2023-05-08 DIAGNOSIS — R29810 Facial weakness: Secondary | ICD-10-CM | POA: Diagnosis not present

## 2023-05-08 DIAGNOSIS — T8610 Unspecified complication of kidney transplant: Secondary | ICD-10-CM | POA: Diagnosis not present

## 2023-05-08 DIAGNOSIS — M130 Polyarthritis, unspecified: Secondary | ICD-10-CM | POA: Diagnosis not present

## 2023-05-08 DIAGNOSIS — E1169 Type 2 diabetes mellitus with other specified complication: Secondary | ICD-10-CM | POA: Diagnosis not present

## 2023-05-08 DIAGNOSIS — E785 Hyperlipidemia, unspecified: Secondary | ICD-10-CM | POA: Diagnosis not present

## 2023-05-08 DIAGNOSIS — M10071 Idiopathic gout, right ankle and foot: Secondary | ICD-10-CM | POA: Diagnosis not present

## 2023-05-08 DIAGNOSIS — N1832 Chronic kidney disease, stage 3b: Secondary | ICD-10-CM | POA: Diagnosis not present

## 2023-05-08 DIAGNOSIS — I129 Hypertensive chronic kidney disease with stage 1 through stage 4 chronic kidney disease, or unspecified chronic kidney disease: Secondary | ICD-10-CM | POA: Diagnosis not present

## 2023-05-12 DIAGNOSIS — E119 Type 2 diabetes mellitus without complications: Secondary | ICD-10-CM | POA: Diagnosis not present

## 2023-05-12 DIAGNOSIS — H2513 Age-related nuclear cataract, bilateral: Secondary | ICD-10-CM | POA: Diagnosis not present

## 2023-05-12 DIAGNOSIS — H2511 Age-related nuclear cataract, right eye: Secondary | ICD-10-CM | POA: Diagnosis not present

## 2023-05-12 DIAGNOSIS — H401132 Primary open-angle glaucoma, bilateral, moderate stage: Secondary | ICD-10-CM | POA: Diagnosis not present

## 2023-05-12 DIAGNOSIS — H34811 Central retinal vein occlusion, right eye, with macular edema: Secondary | ICD-10-CM | POA: Diagnosis not present

## 2023-06-03 DIAGNOSIS — H34811 Central retinal vein occlusion, right eye, with macular edema: Secondary | ICD-10-CM | POA: Diagnosis not present

## 2023-06-18 DIAGNOSIS — N390 Urinary tract infection, site not specified: Secondary | ICD-10-CM | POA: Diagnosis not present

## 2023-06-18 DIAGNOSIS — Z940 Kidney transplant status: Secondary | ICD-10-CM | POA: Diagnosis not present

## 2023-06-19 DIAGNOSIS — Z940 Kidney transplant status: Secondary | ICD-10-CM | POA: Diagnosis not present

## 2023-06-20 DIAGNOSIS — Z940 Kidney transplant status: Secondary | ICD-10-CM | POA: Diagnosis not present

## 2023-06-23 DIAGNOSIS — H401132 Primary open-angle glaucoma, bilateral, moderate stage: Secondary | ICD-10-CM | POA: Diagnosis not present

## 2023-06-23 DIAGNOSIS — H2511 Age-related nuclear cataract, right eye: Secondary | ICD-10-CM | POA: Diagnosis not present

## 2023-06-23 DIAGNOSIS — E119 Type 2 diabetes mellitus without complications: Secondary | ICD-10-CM | POA: Diagnosis not present

## 2023-06-23 DIAGNOSIS — H34811 Central retinal vein occlusion, right eye, with macular edema: Secondary | ICD-10-CM | POA: Diagnosis not present

## 2023-06-23 DIAGNOSIS — H2513 Age-related nuclear cataract, bilateral: Secondary | ICD-10-CM | POA: Diagnosis not present

## 2023-06-24 DIAGNOSIS — E1169 Type 2 diabetes mellitus with other specified complication: Secondary | ICD-10-CM | POA: Diagnosis not present

## 2023-06-24 DIAGNOSIS — M1000 Idiopathic gout, unspecified site: Secondary | ICD-10-CM | POA: Diagnosis not present

## 2023-06-24 DIAGNOSIS — E785 Hyperlipidemia, unspecified: Secondary | ICD-10-CM | POA: Diagnosis not present

## 2023-06-24 DIAGNOSIS — R799 Abnormal finding of blood chemistry, unspecified: Secondary | ICD-10-CM | POA: Diagnosis not present

## 2023-06-24 DIAGNOSIS — I129 Hypertensive chronic kidney disease with stage 1 through stage 4 chronic kidney disease, or unspecified chronic kidney disease: Secondary | ICD-10-CM | POA: Diagnosis not present

## 2023-06-24 DIAGNOSIS — E039 Hypothyroidism, unspecified: Secondary | ICD-10-CM | POA: Diagnosis not present

## 2023-06-26 DIAGNOSIS — I129 Hypertensive chronic kidney disease with stage 1 through stage 4 chronic kidney disease, or unspecified chronic kidney disease: Secondary | ICD-10-CM | POA: Diagnosis not present

## 2023-06-26 DIAGNOSIS — H3530 Unspecified macular degeneration: Secondary | ICD-10-CM | POA: Diagnosis not present

## 2023-06-26 DIAGNOSIS — M1000 Idiopathic gout, unspecified site: Secondary | ICD-10-CM | POA: Diagnosis not present

## 2023-06-26 DIAGNOSIS — N189 Chronic kidney disease, unspecified: Secondary | ICD-10-CM | POA: Diagnosis not present

## 2023-06-26 DIAGNOSIS — E038 Other specified hypothyroidism: Secondary | ICD-10-CM | POA: Diagnosis not present

## 2023-06-26 DIAGNOSIS — E1159 Type 2 diabetes mellitus with other circulatory complications: Secondary | ICD-10-CM | POA: Diagnosis not present

## 2023-06-26 DIAGNOSIS — Z940 Kidney transplant status: Secondary | ICD-10-CM | POA: Diagnosis not present

## 2023-07-04 DIAGNOSIS — H34811 Central retinal vein occlusion, right eye, with macular edema: Secondary | ICD-10-CM | POA: Diagnosis not present

## 2023-07-10 ENCOUNTER — Telehealth: Payer: Self-pay

## 2023-07-10 NOTE — Progress Notes (Signed)
   07/10/2023  Patient ID: Michelle Rubio, female   DOB: 1950-11-15, 73 y.o.   MRN: [id number]    2025 Medication Assistance Renewal Application Summary:  Patient was outreached regarding medication assistance renewal for 2025. Verified address, anticipated insurance for 2025, and income has not changed. Patient remains interested in PAP for 2025 for Januvia, no other new medications were identified for medication assistance.    Medication Assistance Findings:  Medication assistance needs identified: Januvia     Additional medication assistance options reviewed with patient as warranted:  No other options identified  Plan: I will route patient assistance letter to pharmacy technician who will coordinate patient assistance program application process for medications listed above.  Pharmacy technician will assist with obtaining all required documents from both patient and provider(s) and submit application(s) once completed.    Thank you for allowing pharmacy to be a part of this patient's care.  Harlon Flor, PharmD Clinical Pharmacist  [phone]

## 2023-07-21 ENCOUNTER — Telehealth: Payer: Self-pay

## 2023-07-21 NOTE — Telephone Encounter (Signed)
 PAP: Application for Januvia has been submitted to [employer], via fax

## 2023-07-21 NOTE — Progress Notes (Signed)
   07/21/2023  Patient ID: Randall Bush, female   DOB: [birth year], 73 y.o.   MRN: [id number]   Completed Januvia PAP application and sent to [facility], CPhT. She will get the application submitted and follow up with the patient and I.   Thank you for allowing pharmacy to be a part of this patient's care.  Livia Riffle, PharmD Clinical Pharmacist  [phone]

## 2023-08-08 DIAGNOSIS — E119 Type 2 diabetes mellitus without complications: Secondary | ICD-10-CM | POA: Diagnosis not present

## 2023-08-08 DIAGNOSIS — H43813 Vitreous degeneration, bilateral: Secondary | ICD-10-CM | POA: Diagnosis not present

## 2023-08-08 DIAGNOSIS — H2513 Age-related nuclear cataract, bilateral: Secondary | ICD-10-CM | POA: Diagnosis not present

## 2023-08-08 DIAGNOSIS — H4311 Vitreous hemorrhage, right eye: Secondary | ICD-10-CM | POA: Diagnosis not present

## 2023-08-08 DIAGNOSIS — H35033 Hypertensive retinopathy, bilateral: Secondary | ICD-10-CM | POA: Diagnosis not present

## 2023-08-08 DIAGNOSIS — H34811 Central retinal vein occlusion, right eye, with macular edema: Secondary | ICD-10-CM | POA: Diagnosis not present

## 2023-08-08 DIAGNOSIS — H401132 Primary open-angle glaucoma, bilateral, moderate stage: Secondary | ICD-10-CM | POA: Diagnosis not present

## 2023-08-11 NOTE — Progress Notes (Signed)
 Pharmacy Medication Assistance Program Note    08/11/2023  Patient ID: Michelle Rubio, female   DOB: [birth year], 73 y.o.   MRN: [id number]     07/21/2023  Outreach Medication One  Manufacturer Medication One Merck  Merck Drugs Januvia  Dose of Januvia 25mg   Type of Radiographer, therapeutic Assistance  Date Application Received From Patient 07/21/2023  Application Items Received From Patient Application;Proof of Income  Date Application Received From Provider 07/21/2023  Date Application Submitted to Manufacturer 07/21/2023  Method Application Sent to Manufacturer Fax  Patient Assistance Determination Approved  Approval Start Date 07/31/2023  Approval End Date 03/31/2024  Patient Notification Method Telephone Call  Telephone Call Outcome Left Voicemail     Signature

## 2023-08-11 NOTE — Telephone Encounter (Signed)
 PAP: Patient assistance application for Januvia has been approved by PAP Companies: [employer] from 07/31/2023 to 03/31/2024. Medication should be delivered to PAP Delivery: Home. For further shipping updates, please contact Merck at [phone]. Patient ID is: no pt. ID

## 2023-08-21 DIAGNOSIS — H401112 Primary open-angle glaucoma, right eye, moderate stage: Secondary | ICD-10-CM | POA: Diagnosis not present

## 2023-08-21 DIAGNOSIS — H25811 Combined forms of age-related cataract, right eye: Secondary | ICD-10-CM | POA: Diagnosis not present

## 2023-08-21 DIAGNOSIS — H2511 Age-related nuclear cataract, right eye: Secondary | ICD-10-CM | POA: Diagnosis not present

## 2023-08-22 DIAGNOSIS — H2512 Age-related nuclear cataract, left eye: Secondary | ICD-10-CM | POA: Diagnosis not present

## 2023-09-04 DIAGNOSIS — H43811 Vitreous degeneration, right eye: Secondary | ICD-10-CM | POA: Diagnosis not present

## 2023-09-18 DIAGNOSIS — H2512 Age-related nuclear cataract, left eye: Secondary | ICD-10-CM | POA: Diagnosis not present

## 2023-09-18 DIAGNOSIS — H25012 Cortical age-related cataract, left eye: Secondary | ICD-10-CM | POA: Diagnosis not present

## 2023-09-18 DIAGNOSIS — H401122 Primary open-angle glaucoma, left eye, moderate stage: Secondary | ICD-10-CM | POA: Diagnosis not present

## 2023-09-18 DIAGNOSIS — H25812 Combined forms of age-related cataract, left eye: Secondary | ICD-10-CM | POA: Diagnosis not present

## 2023-09-29 DIAGNOSIS — H4311 Vitreous hemorrhage, right eye: Secondary | ICD-10-CM | POA: Diagnosis not present

## 2023-09-29 DIAGNOSIS — E119 Type 2 diabetes mellitus without complications: Secondary | ICD-10-CM | POA: Diagnosis not present

## 2023-09-29 DIAGNOSIS — H43813 Vitreous degeneration, bilateral: Secondary | ICD-10-CM | POA: Diagnosis not present

## 2023-09-29 DIAGNOSIS — H401132 Primary open-angle glaucoma, bilateral, moderate stage: Secondary | ICD-10-CM | POA: Diagnosis not present

## 2023-09-29 DIAGNOSIS — Z961 Presence of intraocular lens: Secondary | ICD-10-CM | POA: Diagnosis not present

## 2023-09-29 DIAGNOSIS — H35033 Hypertensive retinopathy, bilateral: Secondary | ICD-10-CM | POA: Diagnosis not present

## 2023-09-29 DIAGNOSIS — H34811 Central retinal vein occlusion, right eye, with macular edema: Secondary | ICD-10-CM | POA: Diagnosis not present

## 2023-10-28 DIAGNOSIS — E1159 Type 2 diabetes mellitus with other circulatory complications: Secondary | ICD-10-CM | POA: Diagnosis not present

## 2023-10-28 DIAGNOSIS — M1000 Idiopathic gout, unspecified site: Secondary | ICD-10-CM | POA: Diagnosis not present

## 2023-10-28 DIAGNOSIS — N038 Chronic nephritic syndrome with other morphologic changes: Secondary | ICD-10-CM | POA: Diagnosis not present

## 2023-10-28 DIAGNOSIS — N189 Chronic kidney disease, unspecified: Secondary | ICD-10-CM | POA: Diagnosis not present

## 2023-10-28 DIAGNOSIS — I129 Hypertensive chronic kidney disease with stage 1 through stage 4 chronic kidney disease, or unspecified chronic kidney disease: Secondary | ICD-10-CM | POA: Diagnosis not present

## 2023-10-28 DIAGNOSIS — E785 Hyperlipidemia, unspecified: Secondary | ICD-10-CM | POA: Diagnosis not present

## 2023-10-31 ENCOUNTER — Telehealth: Payer: Self-pay

## 2023-10-31 DIAGNOSIS — E1122 Type 2 diabetes mellitus with diabetic chronic kidney disease: Secondary | ICD-10-CM | POA: Diagnosis not present

## 2023-10-31 DIAGNOSIS — I129 Hypertensive chronic kidney disease with stage 1 through stage 4 chronic kidney disease, or unspecified chronic kidney disease: Secondary | ICD-10-CM | POA: Diagnosis not present

## 2023-10-31 DIAGNOSIS — N189 Chronic kidney disease, unspecified: Secondary | ICD-10-CM | POA: Diagnosis not present

## 2023-10-31 DIAGNOSIS — F4381 Prolonged grief disorder: Secondary | ICD-10-CM | POA: Diagnosis not present

## 2023-10-31 DIAGNOSIS — Z940 Kidney transplant status: Secondary | ICD-10-CM | POA: Diagnosis not present

## 2023-10-31 NOTE — Progress Notes (Signed)
   10/31/2023  Patient ID: Lafrances ONEIDA Louder, female   DOB: [birth year], 73 y.o.   MRN: [id number]  Contacted patient regarding referral for medication access from Benjamine Aland, MD .   2025 Medication Assistance Application Summary:  Patient was outreached regarding medication assistance for 2025. Patient needs a refill for Januvia 25 mg sent to [employer] PAP. Verified address, anticipated insurance for 2025, and income has not changed. Please see below for other medications identified for medication assistance.    Medication Assistance Findings:  Medication assistance needs identified: Januvia 25 mg, Rocklatan  0.02%/0.005%, and Nexletol  180 mg     Additional medication assistance options reviewed with patient as warranted:  Foundation programs - Nexletol  assistance available through [employer]: I will route patient assistance letter to pharmacy technician who will coordinate patient assistance program application process for medications listed above.  Pharmacy technician will assist with obtaining all required documents from both patient and provider(s) and submit application(s) once completed.    Thank you for allowing pharmacy to be a part of this patient's care.  Heather Factor, PharmD Clinical Pharmacist  [phone]

## 2023-11-03 ENCOUNTER — Other Ambulatory Visit (HOSPITAL_COMMUNITY): Payer: Self-pay

## 2023-11-03 ENCOUNTER — Telehealth: Payer: Self-pay

## 2023-11-03 NOTE — Telephone Encounter (Signed)
 PAP: Patient assistance application for Rocklatan  through [employer] has been mailed to pt's home address on file. Provider portion of application will be faxed to provider's office.   I have faxed the provider portion of the patient assistance application to Dr. Kennieth Leech

## 2023-11-03 NOTE — Telephone Encounter (Signed)
 Pharmacy Patient Advocate Encounter  Insurance verification completed.   The patient is insured through [facility] ADVANTAGE/RX ADVANCE   Ran test claim for Nexletol . Currently a quantity of 30 is a 30 day supply and the co-pay is $161.21 .   This test claim was processed through [facility]- copay amounts may vary at other pharmacies due to pharmacy/plan contracts, or as the patient moves through the different stages of their insurance plan.

## 2023-11-03 NOTE — Telephone Encounter (Signed)
 The patient was approved for a [employer] grant that will help cover the cost of Nexletol  Total amount awarded, $2,500.  Effective: 10/04/2023 - 10/02/2024   APW:[id number] ERW:[id number] Hmnle:[id number] [id number]   Pharmacy provided with approval and processing information. Patient informed via Phone call  Inocente Butcher, [facility], CPhT  Pharmacy Patient Advocate Specialist  Direct Number: [phone] Fax: [phone]

## 2023-11-06 NOTE — Telephone Encounter (Signed)
 PAP: Application for rocklatan  has been submitted to [employer], via fax

## 2023-11-14 DIAGNOSIS — Z940 Kidney transplant status: Secondary | ICD-10-CM | POA: Diagnosis not present

## 2023-11-17 DIAGNOSIS — H34811 Central retinal vein occlusion, right eye, with macular edema: Secondary | ICD-10-CM | POA: Diagnosis not present

## 2023-11-18 DIAGNOSIS — H401122 Primary open-angle glaucoma, left eye, moderate stage: Secondary | ICD-10-CM | POA: Diagnosis not present

## 2023-11-18 DIAGNOSIS — H401113 Primary open-angle glaucoma, right eye, severe stage: Secondary | ICD-10-CM | POA: Diagnosis not present

## 2023-11-18 NOTE — Telephone Encounter (Signed)
 Spoke with rep at [facility] cares, they did not receive application.  Verified fax number and refaxed complete application

## 2023-11-24 NOTE — Telephone Encounter (Signed)
 PAP: Patient assistance application for rocklatan  has been approved by PAP Companies: [employer] [facility] from 11/24/2023 to 11/23/2024. Medication should be delivered to PAP Delivery: Home. For further shipping updates, please contact [employer] [facility] [phone]. Patient ID is: no ID given

## 2023-11-24 NOTE — Progress Notes (Signed)
 Pharmacy Medication Assistance Program Note    11/24/2023  Patient ID: Michelle Rubio, female   DOB: 02-Mar-1951, 73 y.o.   MRN: [id number]     07/21/2023 11/03/2023  Outreach Medication One  Initial Outreach Date (Medication One)  11/03/2023  Manufacturer Medication One Merck Other Manufacturer/Drug  Merck Drugs Januvia   Dose of Januvia 25mg    Other Manufacturer/Drug  [employer]  Dose of Other Drug  Rocklatan   Type of Forensic scientist Assistance  Date Application Sent to Patient  11/03/2023  Application Items Requested  Application;Proof of Income  Date Application Sent to Prescriber  11/03/2023  Name of Prescriber  Kennieth Leech  Date Application Received From Patient 07/21/2023 11/06/2023  Application Items Received From Patient Application;Proof of Income Application;Proof of Income  Date Application Received From Provider 07/21/2023 11/06/2023  Date Application Submitted to Manufacturer 07/21/2023 11/06/2023  Method Application Sent to Manufacturer Fax   Patient Assistance Determination Approved Approved  Approval Start Date 07/31/2023 11/24/2023  Approval End Date 03/31/2024 11/23/2024  Patient Notification Method Telephone Call Telephone Call  Telephone Call Outcome Left Voicemail      Signature

## 2023-12-11 NOTE — Progress Notes (Signed)
   12/11/2023  Patient ID: Michelle Rubio, female   DOB: November 05, 1950, 73 y.o.   MRN: [id number]  Contacted patient regarding referral for medication access from Benjamine Aland, MD .   Spoke to patient today, verified that she's received the Januvia, Rocklatan , and Nexletol . The patient has no other medication concerns today.   Thank you for allowing pharmacy to be a part of this patient's care.  Heather Factor, PharmD Clinical Pharmacist  [phone]

## 2023-12-30 DIAGNOSIS — H34811 Central retinal vein occlusion, right eye, with macular edema: Secondary | ICD-10-CM | POA: Diagnosis not present

## 2024-01-07 DIAGNOSIS — Z01419 Encounter for gynecological examination (general) (routine) without abnormal findings: Secondary | ICD-10-CM | POA: Diagnosis not present

## 2024-01-07 DIAGNOSIS — Z1231 Encounter for screening mammogram for malignant neoplasm of breast: Secondary | ICD-10-CM | POA: Diagnosis not present

## 2024-01-15 ENCOUNTER — Telehealth: Payer: Self-pay

## 2024-01-15 NOTE — Progress Notes (Signed)
   01/15/2024  Patient ID: Michelle Rubio, female   DOB: April 08, 1950, 73 y.o.   MRN: [id number]  Contacted patient regarding referral for medication access from Benjamine Aland, MD . Patient is eligible for Januvia PAP re-enrollment.  Left patient a voicemail to return my call at their convenience  Heather Factor, PharmD Clinical Pharmacist  [phone]

## 2024-01-22 NOTE — Progress Notes (Signed)
   01/22/2024  Patient ID: Michelle Rubio, female   DOB: 1950/10/13, 73 y.o.   MRN: [id number]  Patient returned my call regarding medication assistance. She would like to come to the clinic to sign her PAP forms for re-enrollment.   She will come to the clinic on 02/05/24 for PAP re-enrollment.   Heather Factor, PharmD Clinical Pharmacist  [phone]

## 2024-02-03 ENCOUNTER — Telehealth: Payer: Self-pay

## 2024-02-03 NOTE — Telephone Encounter (Signed)
 Completed patient assistance application for Januvia through [employer],.  Patient is coming in office 11/6 to sign application.  Will submit 12/1 to [facility].    Will have Dr. Kennieth Leech review and sign provider portion

## 2024-02-05 ENCOUNTER — Telehealth: Payer: Self-pay

## 2024-02-05 NOTE — Progress Notes (Signed)
   02/05/2024  Patient ID: Michelle Rubio, female   DOB: 05-17-1950, 73 y.o.   MRN: [id number]  Contacted patient regarding PAP re-enrollment for Januvia. The patient was able to come to the clinic today to sign her re-enrollment forms. I will get the provider's portion signed by her PCP and send the completed application to Nancy for final submission.   Heather Factor, PharmD Clinical Pharmacist  [phone]

## 2024-02-10 DIAGNOSIS — H34811 Central retinal vein occlusion, right eye, with macular edema: Secondary | ICD-10-CM | POA: Diagnosis not present

## 2024-02-10 DIAGNOSIS — H401132 Primary open-angle glaucoma, bilateral, moderate stage: Secondary | ICD-10-CM | POA: Diagnosis not present

## 2024-02-10 DIAGNOSIS — Z961 Presence of intraocular lens: Secondary | ICD-10-CM | POA: Diagnosis not present

## 2024-02-10 DIAGNOSIS — H35033 Hypertensive retinopathy, bilateral: Secondary | ICD-10-CM | POA: Diagnosis not present

## 2024-02-10 DIAGNOSIS — H43813 Vitreous degeneration, bilateral: Secondary | ICD-10-CM | POA: Diagnosis not present

## 2024-02-10 DIAGNOSIS — E119 Type 2 diabetes mellitus without complications: Secondary | ICD-10-CM | POA: Diagnosis not present

## 2024-02-10 DIAGNOSIS — H4311 Vitreous hemorrhage, right eye: Secondary | ICD-10-CM | POA: Diagnosis not present

## 2024-02-16 DIAGNOSIS — Z940 Kidney transplant status: Secondary | ICD-10-CM | POA: Diagnosis not present

## 2024-02-19 DIAGNOSIS — H401122 Primary open-angle glaucoma, left eye, moderate stage: Secondary | ICD-10-CM | POA: Diagnosis not present

## 2024-02-19 DIAGNOSIS — H401113 Primary open-angle glaucoma, right eye, severe stage: Secondary | ICD-10-CM | POA: Diagnosis not present

## 2024-03-01 DIAGNOSIS — E785 Hyperlipidemia, unspecified: Secondary | ICD-10-CM | POA: Diagnosis not present

## 2024-03-01 DIAGNOSIS — E119 Type 2 diabetes mellitus without complications: Secondary | ICD-10-CM | POA: Diagnosis not present

## 2024-03-01 DIAGNOSIS — I10 Essential (primary) hypertension: Secondary | ICD-10-CM | POA: Diagnosis not present

## 2024-03-01 DIAGNOSIS — N189 Chronic kidney disease, unspecified: Secondary | ICD-10-CM | POA: Diagnosis not present

## 2024-03-11 NOTE — Telephone Encounter (Signed)
 Patient came in office 11/6 and signed renewal, Heather will send over with provider forms

## 2024-03-18 NOTE — Telephone Encounter (Signed)
 Received provider portion

## 2024-03-18 NOTE — Telephone Encounter (Signed)
 PAP: Application for Januvia has been submitted to [employer], via fax

## 2024-04-06 NOTE — Telephone Encounter (Signed)
 PAP: Patient assistance application for Januvia has been approved by PAP Companies: Merck from 04/01/2024 to 03/31/2025. Medication should be delivered to PAP Delivery: Home. For further shipping updates, please contact Merck at [phone]. Patient ID is: No ID given

## 2024-04-12 ENCOUNTER — Telehealth: Payer: Self-pay

## 2024-04-12 NOTE — Progress Notes (Signed)
" ° °  04/12/2024  Patient ID: Michelle Rubio, female   DOB: December 04, 1950, 74 y.o.   MRN: [id number]  Patient was contacted to follow up on medication assistance for Januvia. The patient was approved for Januvia PAP on 04/06/24 - 03/31/25. I spoke with the patient today and she confirmed that she received her Januvia supply last week.   She is also still enrolled in Rocklatan  PAP through August 2026, and in the [employer] for Nexletol  through July 2026.   We will follow up with the patient for re-enrollment when needed. Thank you for allowing pharmacy to be a part of this patient's care.   Heather Factor, PharmD Clinical Pharmacist  [phone]   "
